# Patient Record
Sex: Female | Born: 1935 | Race: White | Hispanic: No | Smoking: Never smoker
Health system: Southern US, Community
[De-identification: ages and names within clinical notes are randomized; demographics above are authoritative.]

## PROBLEM LIST (undated history)

## (undated) DIAGNOSIS — R609 Edema, unspecified: Secondary | ICD-10-CM

## (undated) DIAGNOSIS — R17 Unspecified jaundice: Secondary | ICD-10-CM

## (undated) DIAGNOSIS — E538 Deficiency of other specified B group vitamins: Secondary | ICD-10-CM

## (undated) DIAGNOSIS — R5383 Other fatigue: Secondary | ICD-10-CM

## (undated) DIAGNOSIS — M1990 Unspecified osteoarthritis, unspecified site: Secondary | ICD-10-CM

## (undated) DIAGNOSIS — E7800 Pure hypercholesterolemia, unspecified: Secondary | ICD-10-CM

## (undated) DIAGNOSIS — E875 Hyperkalemia: Secondary | ICD-10-CM

## (undated) DIAGNOSIS — R948 Abnormal results of function studies of other organs and systems: Secondary | ICD-10-CM

## (undated) DIAGNOSIS — E559 Vitamin D deficiency, unspecified: Secondary | ICD-10-CM

## (undated) DIAGNOSIS — E039 Hypothyroidism, unspecified: Secondary | ICD-10-CM

## (undated) DIAGNOSIS — G894 Chronic pain syndrome: Secondary | ICD-10-CM

## (undated) DIAGNOSIS — M79609 Pain in unspecified limb: Secondary | ICD-10-CM

## (undated) DIAGNOSIS — G8929 Other chronic pain: Secondary | ICD-10-CM

## (undated) DIAGNOSIS — M549 Dorsalgia, unspecified: Secondary | ICD-10-CM

## (undated) DIAGNOSIS — K279 Peptic ulcer, site unspecified, unspecified as acute or chronic, without hemorrhage or perforation: Secondary | ICD-10-CM

## (undated) DIAGNOSIS — M949 Disorder of cartilage, unspecified: Secondary | ICD-10-CM

## (undated) DIAGNOSIS — I10 Essential (primary) hypertension: Secondary | ICD-10-CM

## (undated) DIAGNOSIS — M109 Gout, unspecified: Secondary | ICD-10-CM

## (undated) DIAGNOSIS — G5600 Carpal tunnel syndrome, unspecified upper limb: Secondary | ICD-10-CM

## (undated) DIAGNOSIS — R5381 Other malaise: Secondary | ICD-10-CM

## (undated) DIAGNOSIS — M899 Disorder of bone, unspecified: Secondary | ICD-10-CM

## (undated) DIAGNOSIS — E119 Type 2 diabetes mellitus without complications: Secondary | ICD-10-CM

## (undated) DIAGNOSIS — R413 Other amnesia: Secondary | ICD-10-CM

## (undated) DIAGNOSIS — N19 Unspecified kidney failure: Secondary | ICD-10-CM

## (undated) DIAGNOSIS — G589 Mononeuropathy, unspecified: Secondary | ICD-10-CM

## (undated) DIAGNOSIS — Z9289 Personal history of other medical treatment: Secondary | ICD-10-CM

## (undated) DIAGNOSIS — K219 Gastro-esophageal reflux disease without esophagitis: Secondary | ICD-10-CM

## (undated) DIAGNOSIS — T887XXA Unspecified adverse effect of drug or medicament, initial encounter: Secondary | ICD-10-CM

## (undated) HISTORY — DX: Other fatigue: R53.81

## (undated) HISTORY — DX: Personal history of other medical treatment: Z92.89

## (undated) HISTORY — DX: Essential (primary) hypertension: I10

## (undated) HISTORY — DX: Carpal tunnel syndrome, unspecified upper limb: G56.00

## (undated) HISTORY — DX: Other amnesia: R41.3

## (undated) HISTORY — PX: TOE AMPUTATION: SHX809

## (undated) HISTORY — DX: Dorsalgia, unspecified: M54.9

## (undated) HISTORY — DX: Pain in unspecified limb: M79.609

## (undated) HISTORY — DX: Gout, unspecified: M10.9

## (undated) HISTORY — DX: Edema, unspecified: R60.9

## (undated) HISTORY — PX: ESOPHAGOGASTRODUODENOSCOPY: SHX1529

## (undated) HISTORY — DX: Gastro-esophageal reflux disease without esophagitis: K21.9

## (undated) HISTORY — PX: OTHER SURGICAL HISTORY: SHX169

## (undated) HISTORY — DX: Deficiency of other specified B group vitamins: E53.8

## (undated) HISTORY — DX: Disorder of bone, unspecified: M89.9

## (undated) HISTORY — PX: TONSILLECTOMY: SUR1361

## (undated) HISTORY — PX: THYROIDECTOMY: SHX17

## (undated) HISTORY — DX: Chronic pain syndrome: G89.4

## (undated) HISTORY — DX: Unspecified adverse effect of drug or medicament, initial encounter: T88.7XXA

## (undated) HISTORY — DX: Peptic ulcer, site unspecified, unspecified as acute or chronic, without hemorrhage or perforation: K27.9

## (undated) HISTORY — DX: Unspecified osteoarthritis, unspecified site: M19.90

## (undated) HISTORY — DX: Unspecified jaundice: R17

## (undated) HISTORY — DX: Pure hypercholesterolemia, unspecified: E78.00

## (undated) HISTORY — DX: Hypothyroidism, unspecified: E03.9

## (undated) HISTORY — DX: Other malaise: R53.83

## (undated) HISTORY — DX: Unspecified kidney failure: N19

## (undated) HISTORY — PX: CATARACT EXTRACTION: SUR2

## (undated) HISTORY — DX: Vitamin D deficiency, unspecified: E55.9

## (undated) HISTORY — DX: Hyperkalemia: E87.5

## (undated) HISTORY — DX: Other chronic pain: G89.29

## (undated) HISTORY — DX: Mononeuropathy, unspecified: G58.9

## (undated) HISTORY — DX: Type 2 diabetes mellitus without complications: E11.9

## (undated) HISTORY — DX: Disorder of cartilage, unspecified: M94.9

## (undated) HISTORY — DX: Abnormal results of function studies of other organs and systems: R94.8

---
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
## 1997-07-22 ENCOUNTER — Encounter: Admission: RE | Admit: 1997-07-22 | Discharge: 1997-10-20 | Payer: Self-pay | Admitting: Internal Medicine

## 1997-07-22 ENCOUNTER — Encounter: Admission: RE | Admit: 1997-07-22 | Discharge: 1997-07-22 | Payer: Self-pay | Admitting: Internal Medicine

## 1997-08-14 ENCOUNTER — Ambulatory Visit (HOSPITAL_COMMUNITY): Admission: RE | Admit: 1997-08-14 | Discharge: 1997-08-14 | Payer: Self-pay | Admitting: Orthopedic Surgery

## 1997-11-25 ENCOUNTER — Encounter: Admission: RE | Admit: 1997-11-25 | Discharge: 1998-02-23 | Payer: Self-pay | Admitting: Internal Medicine

## 1998-04-22 LAB — FECAL OCCULT BLOOD, GUAIAC: Fecal Occult Blood: NEGATIVE

## 1998-05-12 ENCOUNTER — Encounter: Admission: RE | Admit: 1998-05-12 | Discharge: 1998-05-14 | Payer: Self-pay | Admitting: Internal Medicine

## 1998-08-18 ENCOUNTER — Encounter: Admission: RE | Admit: 1998-08-18 | Discharge: 1998-11-16 | Payer: Self-pay | Admitting: Internal Medicine

## 1998-12-14 ENCOUNTER — Encounter: Admission: RE | Admit: 1998-12-14 | Discharge: 1999-03-14 | Payer: Self-pay | Admitting: Orthopedic Surgery

## 1999-03-15 ENCOUNTER — Encounter: Admission: RE | Admit: 1999-03-15 | Discharge: 1999-06-13 | Payer: Self-pay | Admitting: Orthopedic Surgery

## 1999-09-06 ENCOUNTER — Encounter: Admission: RE | Admit: 1999-09-06 | Discharge: 1999-12-05 | Payer: Self-pay | Admitting: Orthopedic Surgery

## 1999-09-21 ENCOUNTER — Encounter: Payer: Self-pay | Admitting: Internal Medicine

## 1999-09-21 ENCOUNTER — Ambulatory Visit (HOSPITAL_COMMUNITY): Admission: RE | Admit: 1999-09-21 | Discharge: 1999-09-21 | Payer: Self-pay | Admitting: Internal Medicine

## 1999-11-01 ENCOUNTER — Encounter: Payer: Self-pay | Admitting: *Deleted

## 1999-11-01 ENCOUNTER — Ambulatory Visit (HOSPITAL_COMMUNITY): Admission: RE | Admit: 1999-11-01 | Discharge: 1999-11-01 | Payer: Self-pay | Admitting: *Deleted

## 1999-12-14 ENCOUNTER — Encounter: Admission: RE | Admit: 1999-12-14 | Discharge: 2000-03-13 | Payer: Self-pay | Admitting: Orthopedic Surgery

## 2000-02-22 ENCOUNTER — Encounter: Payer: Self-pay | Admitting: Family Medicine

## 2000-03-21 ENCOUNTER — Encounter: Admission: RE | Admit: 2000-03-21 | Discharge: 2000-06-19 | Payer: Self-pay | Admitting: Orthopedic Surgery

## 2000-04-03 ENCOUNTER — Encounter: Payer: Self-pay | Admitting: Nephrology

## 2000-04-03 ENCOUNTER — Encounter: Admission: RE | Admit: 2000-04-03 | Discharge: 2000-04-03 | Payer: Self-pay | Admitting: Nephrology

## 2000-06-20 ENCOUNTER — Encounter: Admission: RE | Admit: 2000-06-20 | Discharge: 2000-06-22 | Payer: Self-pay | Admitting: Orthopedic Surgery

## 2000-09-22 ENCOUNTER — Other Ambulatory Visit: Admission: RE | Admit: 2000-09-22 | Discharge: 2000-09-22 | Payer: Self-pay | Admitting: *Deleted

## 2000-10-03 ENCOUNTER — Encounter: Admission: RE | Admit: 2000-10-03 | Discharge: 2000-10-09 | Payer: Self-pay | Admitting: Orthopedic Surgery

## 2001-01-16 ENCOUNTER — Encounter: Admission: RE | Admit: 2001-01-16 | Discharge: 2001-01-23 | Payer: Self-pay | Admitting: Orthopedic Surgery

## 2001-04-24 ENCOUNTER — Encounter: Admission: RE | Admit: 2001-04-24 | Discharge: 2001-04-30 | Payer: Self-pay | Admitting: Orthopedic Surgery

## 2001-07-23 ENCOUNTER — Encounter (HOSPITAL_BASED_OUTPATIENT_CLINIC_OR_DEPARTMENT_OTHER): Admission: RE | Admit: 2001-07-23 | Discharge: 2001-10-21 | Payer: Self-pay | Admitting: Orthopedic Surgery

## 2001-10-29 ENCOUNTER — Encounter (HOSPITAL_BASED_OUTPATIENT_CLINIC_OR_DEPARTMENT_OTHER): Admission: RE | Admit: 2001-10-29 | Discharge: 2001-11-02 | Payer: Self-pay | Admitting: Internal Medicine

## 2002-02-11 ENCOUNTER — Encounter (HOSPITAL_BASED_OUTPATIENT_CLINIC_OR_DEPARTMENT_OTHER): Admission: RE | Admit: 2002-02-11 | Discharge: 2002-05-12 | Payer: Self-pay | Admitting: Internal Medicine

## 2002-05-17 ENCOUNTER — Encounter (HOSPITAL_BASED_OUTPATIENT_CLINIC_OR_DEPARTMENT_OTHER): Admission: RE | Admit: 2002-05-17 | Discharge: 2002-08-15 | Payer: Self-pay | Admitting: Internal Medicine

## 2002-08-22 ENCOUNTER — Encounter (HOSPITAL_BASED_OUTPATIENT_CLINIC_OR_DEPARTMENT_OTHER): Admission: RE | Admit: 2002-08-22 | Discharge: 2002-08-27 | Payer: Self-pay | Admitting: Internal Medicine

## 2002-11-25 ENCOUNTER — Encounter: Payer: Self-pay | Admitting: Internal Medicine

## 2002-11-25 ENCOUNTER — Inpatient Hospital Stay (HOSPITAL_COMMUNITY): Admission: EM | Admit: 2002-11-25 | Discharge: 2002-12-03 | Payer: Self-pay | Admitting: Emergency Medicine

## 2002-11-26 ENCOUNTER — Encounter: Payer: Self-pay | Admitting: Internal Medicine

## 2002-11-27 ENCOUNTER — Encounter (INDEPENDENT_AMBULATORY_CARE_PROVIDER_SITE_OTHER): Payer: Self-pay | Admitting: Specialist

## 2002-11-28 ENCOUNTER — Encounter (HOSPITAL_BASED_OUTPATIENT_CLINIC_OR_DEPARTMENT_OTHER): Admission: RE | Admit: 2002-11-28 | Discharge: 2002-12-24 | Payer: Self-pay | Admitting: Internal Medicine

## 2003-02-26 ENCOUNTER — Encounter: Admission: RE | Admit: 2003-02-26 | Discharge: 2003-02-26 | Payer: Self-pay | Admitting: Internal Medicine

## 2003-03-06 ENCOUNTER — Ambulatory Visit (HOSPITAL_COMMUNITY): Admission: RE | Admit: 2003-03-06 | Discharge: 2003-03-06 | Payer: Self-pay | Admitting: Gastroenterology

## 2003-03-20 ENCOUNTER — Encounter (HOSPITAL_BASED_OUTPATIENT_CLINIC_OR_DEPARTMENT_OTHER): Admission: RE | Admit: 2003-03-20 | Discharge: 2003-04-04 | Payer: Self-pay | Admitting: Internal Medicine

## 2003-06-20 ENCOUNTER — Encounter (HOSPITAL_BASED_OUTPATIENT_CLINIC_OR_DEPARTMENT_OTHER): Admission: RE | Admit: 2003-06-20 | Discharge: 2003-07-03 | Payer: Self-pay | Admitting: Internal Medicine

## 2003-11-13 ENCOUNTER — Encounter (HOSPITAL_BASED_OUTPATIENT_CLINIC_OR_DEPARTMENT_OTHER): Admission: RE | Admit: 2003-11-13 | Discharge: 2003-11-18 | Payer: Self-pay | Admitting: Internal Medicine

## 2004-02-17 ENCOUNTER — Encounter (HOSPITAL_BASED_OUTPATIENT_CLINIC_OR_DEPARTMENT_OTHER): Admission: RE | Admit: 2004-02-17 | Discharge: 2004-03-16 | Payer: Self-pay | Admitting: Internal Medicine

## 2004-06-07 ENCOUNTER — Encounter (HOSPITAL_BASED_OUTPATIENT_CLINIC_OR_DEPARTMENT_OTHER): Admission: RE | Admit: 2004-06-07 | Discharge: 2004-09-05 | Payer: Self-pay | Admitting: Surgery

## 2004-07-08 ENCOUNTER — Ambulatory Visit (HOSPITAL_COMMUNITY): Admission: RE | Admit: 2004-07-08 | Discharge: 2004-07-08 | Payer: Self-pay | Admitting: Orthopedic Surgery

## 2004-07-09 ENCOUNTER — Ambulatory Visit (HOSPITAL_COMMUNITY): Admission: RE | Admit: 2004-07-09 | Discharge: 2004-07-09 | Payer: Self-pay | Admitting: Orthopedic Surgery

## 2004-09-27 ENCOUNTER — Encounter (HOSPITAL_BASED_OUTPATIENT_CLINIC_OR_DEPARTMENT_OTHER): Admission: RE | Admit: 2004-09-27 | Discharge: 2004-10-05 | Payer: Self-pay | Admitting: Surgery

## 2004-10-15 ENCOUNTER — Inpatient Hospital Stay (HOSPITAL_COMMUNITY): Admission: RE | Admit: 2004-10-15 | Discharge: 2004-10-16 | Payer: Self-pay | Admitting: General Surgery

## 2004-10-15 ENCOUNTER — Encounter (INDEPENDENT_AMBULATORY_CARE_PROVIDER_SITE_OTHER): Payer: Self-pay | Admitting: Specialist

## 2004-12-09 ENCOUNTER — Encounter (HOSPITAL_BASED_OUTPATIENT_CLINIC_OR_DEPARTMENT_OTHER): Admission: RE | Admit: 2004-12-09 | Discharge: 2004-12-20 | Payer: Self-pay | Admitting: Surgery

## 2005-01-21 ENCOUNTER — Ambulatory Visit (HOSPITAL_COMMUNITY): Admission: RE | Admit: 2005-01-21 | Discharge: 2005-01-21 | Payer: Self-pay | Admitting: Internal Medicine

## 2005-02-01 ENCOUNTER — Ambulatory Visit (HOSPITAL_BASED_OUTPATIENT_CLINIC_OR_DEPARTMENT_OTHER): Admission: RE | Admit: 2005-02-01 | Discharge: 2005-02-01 | Payer: Self-pay | Admitting: Orthopedic Surgery

## 2005-02-01 ENCOUNTER — Ambulatory Visit (HOSPITAL_COMMUNITY): Admission: RE | Admit: 2005-02-01 | Discharge: 2005-02-01 | Payer: Self-pay | Admitting: Orthopedic Surgery

## 2005-04-12 ENCOUNTER — Encounter: Payer: Self-pay | Admitting: Family Medicine

## 2005-04-12 LAB — CONVERTED CEMR LAB: Hgb A1c MFr Bld: 3.3 %

## 2005-09-29 ENCOUNTER — Ambulatory Visit: Payer: Self-pay | Admitting: Family Medicine

## 2005-10-26 ENCOUNTER — Encounter: Admission: RE | Admit: 2005-10-26 | Discharge: 2005-10-26 | Payer: Self-pay | Admitting: Gastroenterology

## 2006-08-02 ENCOUNTER — Encounter: Payer: Self-pay | Admitting: Family Medicine

## 2006-08-02 DIAGNOSIS — M1990 Unspecified osteoarthritis, unspecified site: Secondary | ICD-10-CM

## 2006-08-02 DIAGNOSIS — R609 Edema, unspecified: Secondary | ICD-10-CM | POA: Insufficient documentation

## 2006-08-02 DIAGNOSIS — K219 Gastro-esophageal reflux disease without esophagitis: Secondary | ICD-10-CM

## 2006-08-02 DIAGNOSIS — N19 Unspecified kidney failure: Secondary | ICD-10-CM

## 2006-08-02 DIAGNOSIS — I10 Essential (primary) hypertension: Secondary | ICD-10-CM

## 2006-08-02 DIAGNOSIS — K279 Peptic ulcer, site unspecified, unspecified as acute or chronic, without hemorrhage or perforation: Secondary | ICD-10-CM | POA: Insufficient documentation

## 2006-08-02 DIAGNOSIS — G894 Chronic pain syndrome: Secondary | ICD-10-CM

## 2006-08-02 DIAGNOSIS — E119 Type 2 diabetes mellitus without complications: Secondary | ICD-10-CM | POA: Insufficient documentation

## 2006-08-02 DIAGNOSIS — E039 Hypothyroidism, unspecified: Secondary | ICD-10-CM

## 2006-08-02 DIAGNOSIS — E7800 Pure hypercholesterolemia, unspecified: Secondary | ICD-10-CM | POA: Insufficient documentation

## 2006-08-02 DIAGNOSIS — R17 Unspecified jaundice: Secondary | ICD-10-CM | POA: Insufficient documentation

## 2006-08-02 DIAGNOSIS — G589 Mononeuropathy, unspecified: Secondary | ICD-10-CM | POA: Insufficient documentation

## 2006-08-02 DIAGNOSIS — G5600 Carpal tunnel syndrome, unspecified upper limb: Secondary | ICD-10-CM

## 2006-08-04 ENCOUNTER — Ambulatory Visit: Payer: Self-pay | Admitting: Family Medicine

## 2006-08-04 DIAGNOSIS — M79609 Pain in unspecified limb: Secondary | ICD-10-CM

## 2006-08-04 DIAGNOSIS — M8580 Other specified disorders of bone density and structure, unspecified site: Secondary | ICD-10-CM

## 2006-08-04 DIAGNOSIS — M549 Dorsalgia, unspecified: Secondary | ICD-10-CM | POA: Insufficient documentation

## 2006-08-08 ENCOUNTER — Encounter: Payer: Self-pay | Admitting: Family Medicine

## 2006-08-14 ENCOUNTER — Encounter (INDEPENDENT_AMBULATORY_CARE_PROVIDER_SITE_OTHER): Payer: Self-pay | Admitting: *Deleted

## 2006-11-30 ENCOUNTER — Telehealth: Payer: Self-pay | Admitting: Family Medicine

## 2006-12-15 ENCOUNTER — Ambulatory Visit: Payer: Self-pay | Admitting: Family Medicine

## 2007-01-15 ENCOUNTER — Encounter: Payer: Self-pay | Admitting: Family Medicine

## 2007-02-01 ENCOUNTER — Encounter (INDEPENDENT_AMBULATORY_CARE_PROVIDER_SITE_OTHER): Payer: Self-pay | Admitting: *Deleted

## 2007-03-07 ENCOUNTER — Ambulatory Visit: Payer: Self-pay | Admitting: Internal Medicine

## 2007-03-08 LAB — CONVERTED CEMR LAB
Albumin: 3.9 g/dL (ref 3.5–5.2)
Basophils Absolute: 0 10*3/uL (ref 0.0–0.1)
Calcium: 9.2 mg/dL (ref 8.4–10.5)
Chloride: 103 meq/L (ref 96–112)
Eosinophils Absolute: 0 10*3/uL (ref 0.0–0.6)
GFR calc Af Amer: 27 mL/min
GFR calc non Af Amer: 23 mL/min
MCHC: 34.5 g/dL (ref 30.0–36.0)
MCV: 97.8 fL (ref 78.0–100.0)
Monocytes Relative: 6.5 % (ref 3.0–11.0)
Platelets: 225 10*3/uL (ref 150–400)
Potassium: 4.5 meq/L (ref 3.5–5.1)
RBC: 3.71 M/uL — ABNORMAL LOW (ref 3.87–5.11)
RDW: 12.8 % (ref 11.5–14.6)

## 2007-03-09 ENCOUNTER — Ambulatory Visit: Payer: Self-pay | Admitting: Family Medicine

## 2007-04-06 ENCOUNTER — Telehealth: Payer: Self-pay | Admitting: Family Medicine

## 2007-07-02 ENCOUNTER — Ambulatory Visit: Payer: Self-pay | Admitting: Family Medicine

## 2007-07-05 LAB — CONVERTED CEMR LAB
ALT: 11 units/L (ref 0–35)
BUN: 42 mg/dL — ABNORMAL HIGH (ref 6–23)
Bilirubin, Direct: 0.1 mg/dL (ref 0.0–0.3)
CO2: 27 meq/L (ref 19–32)
Chloride: 107 meq/L (ref 96–112)
Glucose, Bld: 134 mg/dL — ABNORMAL HIGH (ref 70–99)
Potassium: 4.6 meq/L (ref 3.5–5.1)
Pro B Natriuretic peptide (BNP): 100 pg/mL (ref 0.0–100.0)
Sed Rate: 23 mm/hr — ABNORMAL HIGH (ref 0–22)
Sodium: 139 meq/L (ref 135–145)
TSH: 0.36 microintl units/mL (ref 0.35–5.50)
Total Bilirubin: 1.1 mg/dL (ref 0.3–1.2)

## 2007-07-17 ENCOUNTER — Telehealth: Payer: Self-pay | Admitting: Family Medicine

## 2007-08-10 ENCOUNTER — Encounter: Payer: Self-pay | Admitting: Family Medicine

## 2007-08-16 ENCOUNTER — Encounter (INDEPENDENT_AMBULATORY_CARE_PROVIDER_SITE_OTHER): Payer: Self-pay | Admitting: *Deleted

## 2007-08-16 LAB — HM MAMMOGRAPHY: HM Mammogram: NORMAL

## 2007-08-29 ENCOUNTER — Telehealth: Payer: Self-pay | Admitting: Family Medicine

## 2007-09-04 ENCOUNTER — Encounter
Admission: RE | Admit: 2007-09-04 | Discharge: 2007-11-02 | Payer: Self-pay | Admitting: Physical Medicine and Rehabilitation

## 2007-09-05 ENCOUNTER — Ambulatory Visit: Payer: Self-pay | Admitting: Physical Medicine and Rehabilitation

## 2007-09-05 ENCOUNTER — Encounter: Payer: Self-pay | Admitting: Family Medicine

## 2007-09-05 ENCOUNTER — Encounter: Payer: Self-pay | Admitting: Physical Medicine and Rehabilitation

## 2007-09-05 ENCOUNTER — Ambulatory Visit: Payer: Self-pay | Admitting: Vascular Surgery

## 2007-09-05 ENCOUNTER — Ambulatory Visit (HOSPITAL_COMMUNITY)
Admission: RE | Admit: 2007-09-05 | Discharge: 2007-09-05 | Payer: Self-pay | Admitting: Physical Medicine and Rehabilitation

## 2007-09-17 ENCOUNTER — Ambulatory Visit: Payer: Self-pay | Admitting: Physical Medicine and Rehabilitation

## 2007-09-26 ENCOUNTER — Encounter: Payer: Self-pay | Admitting: Family Medicine

## 2007-09-26 ENCOUNTER — Encounter
Admission: RE | Admit: 2007-09-26 | Discharge: 2007-09-26 | Payer: Self-pay | Admitting: Physical Medicine and Rehabilitation

## 2007-10-01 ENCOUNTER — Encounter: Payer: Self-pay | Admitting: Family Medicine

## 2007-10-09 ENCOUNTER — Telehealth (INDEPENDENT_AMBULATORY_CARE_PROVIDER_SITE_OTHER): Payer: Self-pay | Admitting: *Deleted

## 2007-11-02 ENCOUNTER — Ambulatory Visit: Payer: Self-pay | Admitting: Physical Medicine and Rehabilitation

## 2007-11-12 ENCOUNTER — Telehealth (INDEPENDENT_AMBULATORY_CARE_PROVIDER_SITE_OTHER): Payer: Self-pay | Admitting: *Deleted

## 2007-12-11 ENCOUNTER — Telehealth: Payer: Self-pay | Admitting: Family Medicine

## 2008-01-09 ENCOUNTER — Telehealth: Payer: Self-pay | Admitting: Family Medicine

## 2008-01-24 ENCOUNTER — Encounter
Admission: RE | Admit: 2008-01-24 | Discharge: 2008-02-29 | Payer: Self-pay | Admitting: Physical Medicine and Rehabilitation

## 2008-01-25 ENCOUNTER — Ambulatory Visit: Payer: Self-pay | Admitting: Physical Medicine and Rehabilitation

## 2008-02-04 ENCOUNTER — Telehealth: Payer: Self-pay | Admitting: Family Medicine

## 2008-02-29 ENCOUNTER — Ambulatory Visit: Payer: Self-pay | Admitting: Physical Medicine and Rehabilitation

## 2008-03-04 ENCOUNTER — Encounter: Payer: Self-pay | Admitting: Family Medicine

## 2008-03-10 ENCOUNTER — Telehealth: Payer: Self-pay | Admitting: Family Medicine

## 2008-03-21 ENCOUNTER — Encounter: Payer: Self-pay | Admitting: Family Medicine

## 2008-03-31 ENCOUNTER — Encounter: Payer: Self-pay | Admitting: Family Medicine

## 2008-04-09 ENCOUNTER — Telehealth: Payer: Self-pay | Admitting: Family Medicine

## 2008-04-14 ENCOUNTER — Telehealth: Payer: Self-pay | Admitting: Internal Medicine

## 2008-04-24 ENCOUNTER — Encounter
Admission: RE | Admit: 2008-04-24 | Discharge: 2008-07-23 | Payer: Self-pay | Admitting: Physical Medicine and Rehabilitation

## 2008-04-25 ENCOUNTER — Ambulatory Visit: Payer: Self-pay | Admitting: Physical Medicine and Rehabilitation

## 2008-05-13 ENCOUNTER — Telehealth: Payer: Self-pay | Admitting: Family Medicine

## 2008-06-09 ENCOUNTER — Ambulatory Visit: Payer: Self-pay | Admitting: Physical Medicine and Rehabilitation

## 2008-06-24 ENCOUNTER — Telehealth: Payer: Self-pay | Admitting: Family Medicine

## 2008-06-25 ENCOUNTER — Ambulatory Visit: Payer: Self-pay | Admitting: Family Medicine

## 2008-06-25 DIAGNOSIS — R5381 Other malaise: Secondary | ICD-10-CM

## 2008-06-25 DIAGNOSIS — R5383 Other fatigue: Secondary | ICD-10-CM

## 2008-06-25 LAB — CONVERTED CEMR LAB
Bilirubin Urine: NEGATIVE
Epithelial cells, urine: 2 /lpf
Glucose, Urine, Semiquant: NEGATIVE
Ketones, urine, test strip: NEGATIVE
Specific Gravity, Urine: 1.01
Urobilinogen, UA: 0.2
Yeast, UA: 0
pH: 6

## 2008-06-26 LAB — CONVERTED CEMR LAB
Albumin: 4.3 g/dL (ref 3.5–5.2)
BUN: 35 mg/dL — ABNORMAL HIGH (ref 6–23)
Basophils Absolute: 0.1 10*3/uL (ref 0.0–0.1)
Calcium: 9.2 mg/dL (ref 8.4–10.5)
Cholesterol: 179 mg/dL (ref 0–200)
Direct LDL: 88.8 mg/dL
Eosinophils Absolute: 0.1 10*3/uL (ref 0.0–0.7)
Folate: 17.5 ng/mL
Glucose, Bld: 155 mg/dL — ABNORMAL HIGH (ref 70–99)
HCT: 37.1 % (ref 36.0–46.0)
HDL: 29.4 mg/dL — ABNORMAL LOW (ref >39.00)
Hemoglobin: 13 g/dL (ref 12.0–15.0)
Hgb A1c MFr Bld: 6.7 % — ABNORMAL HIGH (ref 4.6–6.5)
Lymphs Abs: 4.2 10*3/uL — ABNORMAL HIGH (ref 0.7–4.0)
MCHC: 35 g/dL (ref 30.0–36.0)
Neutro Abs: 1.7 10*3/uL (ref 1.4–7.7)
Platelets: 217 10*3/uL (ref 150.0–400.0)
Potassium: 4.3 meq/L (ref 3.5–5.1)
RDW: 14.8 % — ABNORMAL HIGH (ref 11.5–14.6)
TSH: 0.2 microintl units/mL — ABNORMAL LOW (ref 0.35–5.50)
Total Bilirubin: 0.8 mg/dL (ref 0.3–1.2)
VLDL: 97 mg/dL — ABNORMAL HIGH (ref 0.0–40.0)
Vit D, 25-Hydroxy: 25 ng/mL — ABNORMAL LOW (ref 30–89)
Vitamin B-12: 230 pg/mL (ref 211–911)

## 2008-06-27 ENCOUNTER — Encounter: Payer: Self-pay | Admitting: Family Medicine

## 2008-07-02 ENCOUNTER — Ambulatory Visit: Payer: Self-pay | Admitting: Family Medicine

## 2008-07-02 DIAGNOSIS — E538 Deficiency of other specified B group vitamins: Secondary | ICD-10-CM | POA: Insufficient documentation

## 2008-07-02 DIAGNOSIS — E559 Vitamin D deficiency, unspecified: Secondary | ICD-10-CM | POA: Insufficient documentation

## 2008-07-02 DIAGNOSIS — F0390 Unspecified dementia without behavioral disturbance: Secondary | ICD-10-CM | POA: Insufficient documentation

## 2008-07-07 ENCOUNTER — Ambulatory Visit: Payer: Self-pay | Admitting: Physical Medicine and Rehabilitation

## 2008-07-09 ENCOUNTER — Ambulatory Visit: Payer: Self-pay | Admitting: Family Medicine

## 2008-07-16 ENCOUNTER — Ambulatory Visit: Payer: Self-pay | Admitting: Family Medicine

## 2008-07-25 ENCOUNTER — Telehealth: Payer: Self-pay | Admitting: Family Medicine

## 2008-07-25 ENCOUNTER — Ambulatory Visit: Payer: Self-pay | Admitting: Family Medicine

## 2008-08-01 ENCOUNTER — Ambulatory Visit: Payer: Self-pay | Admitting: Family Medicine

## 2008-08-07 ENCOUNTER — Ambulatory Visit: Payer: Self-pay | Admitting: Family Medicine

## 2008-08-08 ENCOUNTER — Encounter (INDEPENDENT_AMBULATORY_CARE_PROVIDER_SITE_OTHER): Payer: Self-pay | Admitting: *Deleted

## 2008-08-08 ENCOUNTER — Encounter: Admission: RE | Admit: 2008-08-08 | Discharge: 2008-08-08 | Payer: Self-pay | Admitting: Family Medicine

## 2008-08-08 ENCOUNTER — Encounter: Payer: Self-pay | Admitting: Family Medicine

## 2008-08-11 ENCOUNTER — Encounter
Admission: RE | Admit: 2008-08-11 | Discharge: 2008-08-11 | Payer: Self-pay | Admitting: Physical Medicine & Rehabilitation

## 2008-08-14 ENCOUNTER — Ambulatory Visit: Payer: Self-pay | Admitting: Family Medicine

## 2008-08-14 DIAGNOSIS — G8929 Other chronic pain: Secondary | ICD-10-CM

## 2008-08-27 ENCOUNTER — Telehealth: Payer: Self-pay | Admitting: Family Medicine

## 2008-08-28 ENCOUNTER — Telehealth: Payer: Self-pay | Admitting: Family Medicine

## 2008-09-17 ENCOUNTER — Telehealth: Payer: Self-pay | Admitting: Family Medicine

## 2008-09-25 ENCOUNTER — Ambulatory Visit: Payer: Self-pay | Admitting: Family Medicine

## 2008-09-25 DIAGNOSIS — M109 Gout, unspecified: Secondary | ICD-10-CM

## 2008-10-09 ENCOUNTER — Telehealth: Payer: Self-pay | Admitting: Family Medicine

## 2008-11-06 ENCOUNTER — Ambulatory Visit: Payer: Self-pay | Admitting: Family Medicine

## 2008-11-10 ENCOUNTER — Encounter: Payer: Self-pay | Admitting: Family Medicine

## 2008-11-11 ENCOUNTER — Ambulatory Visit: Payer: Self-pay | Admitting: Family Medicine

## 2009-01-09 ENCOUNTER — Ambulatory Visit: Payer: Self-pay | Admitting: Family Medicine

## 2009-01-13 LAB — CONVERTED CEMR LAB
Albumin: 3.8 g/dL (ref 3.5–5.2)
CO2: 28 meq/L (ref 19–32)
Calcium: 9.1 mg/dL (ref 8.4–10.5)
Chloride: 106 meq/L (ref 96–112)
Phosphorus: 3.9 mg/dL (ref 2.3–4.6)
Potassium: 4.2 meq/L (ref 3.5–5.1)

## 2009-02-09 ENCOUNTER — Ambulatory Visit: Payer: Self-pay | Admitting: Family Medicine

## 2009-02-17 ENCOUNTER — Encounter: Payer: Self-pay | Admitting: Family Medicine

## 2009-02-17 ENCOUNTER — Telehealth: Payer: Self-pay | Admitting: Family Medicine

## 2009-02-27 ENCOUNTER — Telehealth: Payer: Self-pay | Admitting: Family Medicine

## 2009-03-06 ENCOUNTER — Telehealth: Payer: Self-pay | Admitting: Family Medicine

## 2009-03-22 ENCOUNTER — Emergency Department: Payer: Self-pay | Admitting: Emergency Medicine

## 2009-03-23 ENCOUNTER — Telehealth: Payer: Self-pay | Admitting: Family Medicine

## 2009-03-27 ENCOUNTER — Ambulatory Visit: Payer: Self-pay | Admitting: Family Medicine

## 2009-03-27 ENCOUNTER — Telehealth: Payer: Self-pay | Admitting: Family Medicine

## 2009-03-27 DIAGNOSIS — T887XXA Unspecified adverse effect of drug or medicament, initial encounter: Secondary | ICD-10-CM

## 2009-03-30 ENCOUNTER — Telehealth: Payer: Self-pay | Admitting: Family Medicine

## 2009-04-23 ENCOUNTER — Telehealth: Payer: Self-pay | Admitting: Family Medicine

## 2009-05-22 ENCOUNTER — Telehealth: Payer: Self-pay | Admitting: Family Medicine

## 2009-06-23 ENCOUNTER — Telehealth: Payer: Self-pay | Admitting: Family Medicine

## 2009-09-07 ENCOUNTER — Telehealth: Payer: Self-pay | Admitting: Family Medicine

## 2009-10-23 ENCOUNTER — Telehealth: Payer: Self-pay | Admitting: Family Medicine

## 2009-11-19 ENCOUNTER — Telehealth: Payer: Self-pay | Admitting: Family Medicine

## 2009-12-24 ENCOUNTER — Telehealth: Payer: Self-pay | Admitting: Family Medicine

## 2010-01-21 ENCOUNTER — Telehealth: Payer: Self-pay | Admitting: Family Medicine

## 2010-02-23 ENCOUNTER — Telehealth: Payer: Self-pay | Admitting: Family Medicine

## 2010-03-03 ENCOUNTER — Telehealth: Payer: Self-pay | Admitting: Family Medicine

## 2010-03-13 ENCOUNTER — Encounter: Payer: Self-pay | Admitting: Gastroenterology

## 2010-03-13 ENCOUNTER — Encounter: Payer: Self-pay | Admitting: Internal Medicine

## 2010-03-21 LAB — CONVERTED CEMR LAB
Basophils Relative: 0 % (ref 0–1)
Eosinophils Absolute: 0.1 10*3/uL (ref 0.0–0.7)
MCHC: 32.9 g/dL (ref 30.0–36.0)
MCV: 97.6 fL (ref 78.0–100.0)
Monocytes Relative: 10 % (ref 3–12)
Neutro Abs: 7 10*3/uL (ref 1.7–7.7)
Neutrophils Relative %: 70 % (ref 43–77)
Platelets: 241 10*3/uL (ref 150–400)
RBC: 3.8 M/uL — ABNORMAL LOW (ref 3.87–5.11)
Uric Acid, Serum: 9.6 mg/dL — ABNORMAL HIGH (ref 2.4–7.0)
WBC: 10 10*3/uL (ref 4.0–10.5)

## 2010-03-23 ENCOUNTER — Telehealth: Payer: Self-pay | Admitting: Family Medicine

## 2010-03-23 NOTE — Progress Notes (Signed)
Summary: prior auth for nexium denied  Phone Note From Pharmacy   Caller: [facility] Summary of Call: Prior auth denied for nexium.  Letter is on  your shelf. Initial call taken by: Lowella Petties CMA,  March 06, 2009 3:43 PM  Follow-up for Phone Call        please let pt's son know that ins will not pay for three times a day dosing - since this is not a clinically recommended dose (in fact - I have never had pt on three times a day ...) will need to go down to two times a day for a while and update me if symptoms worsen (if they do will need GI consult )  let me know if this is ok -- and will re send px for two times a day instructions  Follow-up by: Judith Part MD,  March 06, 2009 4:47 PM  Additional Follow-up for Phone Call Additional follow up Details #1::        Advised pt's son, he agrees to twice a day dosing.  Please send to [facility].  px written on EMR for call in - thanks -- MT Additional Follow-up by: Lowella Petties CMA,  March 06, 2009 4:56 PM    Additional Follow-up for Phone Call Additional follow up Details #2::    Called to [facility]. Follow-up by: Lowella Petties CMA,  March 06, 2009 5:33 PM  New/Updated Medications: NEXIUM 40 MG CPDR (ESOMEPRAZOLE MAGNESIUM) Take 1 tablet by mouth two times a day Prescriptions: NEXIUM 40 MG CPDR (ESOMEPRAZOLE MAGNESIUM) Take 1 tablet by mouth two times a day  #60 x 11   Entered and Authorized by:   Judith Part MD   Signed by:   Judith Part MD on 03/06/2009   Method used:   Telephoned to ...       [facility]  [address] (retail)       [address]       [place], [place]  [address]       Ph: [phone] or [phone]       Fax: [phone]   RxID:   [id number]

## 2010-03-23 NOTE — Progress Notes (Signed)
Summary: refill request for vicodin  Phone Note Refill Request Message from:  Fax from Pharmacy  Refills Requested: Medication #1:  HYDROCODONE-ACETAMINOPHEN 5-325 MG  TABS 1-2 by mouth q 4-6 hours as needed pain   Last Refilled: 02/09/2009 Faxed request from [place].  Initial call taken by: Lowella Petties CMA,  March 27, 2009 11:49 AM  Follow-up for Phone Call        px written on EMR for call in  Follow-up by: Judith Part MD,  March 27, 2009 1:43 PM  Additional Follow-up for Phone Call Additional follow up Details #1::        Called to [facility]. Additional Follow-up by: Lowella Petties CMA,  March 27, 2009 2:53 PM    New/Updated Medications: HYDROCODONE-ACETAMINOPHEN 5-325 MG  TABS (HYDROCODONE-ACETAMINOPHEN) 1-2 by mouth q 4-6 hours as needed pain Prescriptions: HYDROCODONE-ACETAMINOPHEN 5-325 MG  TABS (HYDROCODONE-ACETAMINOPHEN) 1-2 by mouth q 4-6 hours as needed pain  #90 x 0   Entered and Authorized by:   Judith Part MD   Signed by:   Judith Part MD on 03/27/2009   Method used:   Telephoned to ...       [facility]  [address] (retail)       [address]       [place], [place]  [address]       Ph: [phone] or [phone]       Fax: [phone]   RxID:   [id number]

## 2010-03-23 NOTE — Progress Notes (Signed)
Summary: Hydrocodone-APAP  Phone Note Refill Request Message from:  Fax from Pharmacy on December 24, 2009 2:20 PM  Refills Requested: Medication #1:  HYDROCODONE-ACETAMINOPHEN 5-325 MG  TABS 1-2 by mouth q 4-6 hours as needed pain [facility]  Phone:   [phone]   Method Requested: Telephone to Pharmacy Initial call taken by: Delilah Shan CMA Duncan Dull),  December 24, 2009 2:22 PM  Follow-up for Phone Call        px written on EMR for call in  Follow-up by: Judith Part MD,  December 24, 2009 5:05 PM  Additional Follow-up for Phone Call Additional follow up Details #1::        Medication phoned to [facility] pharmacy as instructed. Lewanda Rife LPN  December 25, 2009 9:47 AM     Prescriptions: HYDROCODONE-ACETAMINOPHEN 5-325 MG  TABS (HYDROCODONE-ACETAMINOPHEN) 1-2 by mouth q 4-6 hours as needed pain  #90 x 0   Entered and Authorized by:   Judith Part MD   Signed by:   Lewanda Rife LPN on 09/81/1914   Method used:   Telephoned to ...       [facility]  [address] (retail)       [address]       [place], [place]  [address]       Ph: [phone] or [phone]       Fax: [phone]   RxID:   [id number]

## 2010-03-23 NOTE — Progress Notes (Signed)
Summary: refill request for gabapentin  Phone Note Refill Request Message from:  Fax from Pharmacy  Refills Requested: Medication #1:  GABAPENTIN 100 MG CAPS one cap by mouth qpm   Last Refilled: 01/23/2009 [facility]  Initial call taken by: Lowella Petties CMA,  February 27, 2009 12:22 PM  Follow-up for Phone Call        px written on EMR for call in  Follow-up by: Judith Part MD,  February 27, 2009 1:27 PM  Additional Follow-up for Phone Call Additional follow up Details #1::        Called to [facility]. Additional Follow-up by: Lowella Petties CMA,  February 27, 2009 2:14 PM    New/Updated Medications: GABAPENTIN 100 MG CAPS (GABAPENTIN) one cap by mouth each am , and 1 by mouth at bedtime Prescriptions: GABAPENTIN 100 MG CAPS (GABAPENTIN) one cap by mouth each am , and 1 by mouth at bedtime  #60 x 5   Entered and Authorized by:   Judith Part MD   Signed by:   Lowella Petties CMA on 02/27/2009   Method used:   Telephoned to ...       [facility]  [address] (retail)       [address]       [place], [place]  [address]       Ph: [phone] or [phone]       Fax: [phone]   RxID:   [id number]

## 2010-03-23 NOTE — Progress Notes (Signed)
Summary: refill request for vicodin  Phone Note Refill Request Message from:  Fax from Pharmacy  Refills Requested: Medication #1:  HYDROCODONE-ACETAMINOPHEN 5-325 MG  TABS 1-2 by mouth q 4-6 hours as needed pain   Last Refilled: 04/23/2009 Faxed request from [place], [phone].  Initial call taken by: Lowella Petties CMA,  May 22, 2009 3:43 PM  Follow-up for Phone Call        px written on EMR for call in  Follow-up by: Judith Part MD,  May 22, 2009 4:45 PM  Additional Follow-up for Phone Call Additional follow up Details #1::        Medication phoned to [facility] pharmacy as instructed. Lewanda Rife LPN  May 23, 4538 4:50 PM     New/Updated Medications: HYDROCODONE-ACETAMINOPHEN 5-325 MG  TABS (HYDROCODONE-ACETAMINOPHEN) 1-2 by mouth q 4-6 hours as needed pain Prescriptions: HYDROCODONE-ACETAMINOPHEN 5-325 MG  TABS (HYDROCODONE-ACETAMINOPHEN) 1-2 by mouth q 4-6 hours as needed pain  #90 x 0   Entered and Authorized by:   Judith Part MD   Signed by:   Lewanda Rife LPN on 98/12/9145   Method used:   Telephoned to ...       [facility]  [address] (retail)       [address]       [place], [place]  [address]       Ph: [phone] or [phone]       Fax: [phone]   RxID:   [id number]

## 2010-03-23 NOTE — Assessment & Plan Note (Signed)
Summary: CONSULTATION W/ PT'S SON,DON/CLE   History of Present Illness: here for consultation about mother today (son is here)  last weekend - had mental status changes  went to [facility]  wants to rev meds    calcium and vit D - not GI tolerated - needs to stop that   lidoderm patch really helps her knees duragesic patch -- is really affecting her MS -- but pain is so severe - " is making her crazy"  hydrocodone is helping -- and that does not seem to affect her   does not think gout is a problem   namenda -- did fine with the titration pack -- until the 3 rd week  5 mg is still trouble some    Allergies: 1)  ! Vioxx 2)  ! * Namenda 10 Mg 3)  ! Aricept 4)  ! Aricept 5)  ! * Namenda 6)  ! * Calcium 7)  Penicillin 8)  Sulfa 9)  Lasix 10)  Percodan 11)  * Lyrica   Impression & Recommendations:  Problem # 1:  ADVERSE DRUG REACTION (ICD-995.20) Assessment New having more mental status changes- rev meds with choice to stop some that are not helping stopping ca, colchicine , neurontin, aricept, namenda  Problem # 2:  OTHER CHRONIC PAIN (ICD-338.29) if no imp in MS- will need to re eval pain control -- rec f/u pain clinic  will disc further at march f/u  Problem # 3:  MEMORY LOSS (ICD-780.93) Assessment: Deteriorated non tol to aricept and namenda-- son thinks this is causing more confusion  will d/c both disc poss of worsening dementia will re eval at march f/u  Problem # 4:  GOUT (ICD-274.9) has been well controlled with allopurinol trial off colchicine and re eval  The following medications were removed from the medication list:    Colchicine 0.6 Mg Tabs (Colchicine) .Marland Kitchen... Take 1 tablet by mouth once a day Her updated medication list for this problem includes:    Allopurinol 100 Mg Tabs (Allopurinol) .Marland Kitchen... 2 tabs by mouth daily  Complete Medication List: 1)  Nexium 40 Mg Cpdr (Esomeprazole magnesium) .... Take 1 tablet by mouth two times a day 2)  Lisinopril  10 Mg Tabs (Lisinopril) .... Take one by mouth daily 3)  Triamterene-hctz 75-50 Mg Tabs (Triamterene-hctz) .... Take 1/2  by mouth daily 4)  Levothroid 100 Mcg Tabs (Levothyroxine sodium) .Marland Kitchen.. 1 by mouth once daily 5)  Humulin 70/30 Susp (Insulin isophane & reg (human) susp) .... Use as directed 6)  Hydrocodone-acetaminophen 5-325 Mg Tabs (Hydrocodone-acetaminophen) .Marland Kitchen.. 1-2 by mouth q 4-6 hours as needed pain 7)  Bd Insulin Syringe Ultrafine 30g X 1/2" 0.5 Ml Misc (Insulin syringe-needle u-100) .... Use as directed 8)  Amlodipine Besylate 10 Mg Tabs (Amlodipine besylate) .... One tab by mouth once daily 9)  Allopurinol 100 Mg Tabs (Allopurinol) .... 2 tabs by mouth daily 10)  Lidoderm 5 % Ptch (Lidocaine) .... Apply up to 3 patches for 12 hours. 11)  Vitamin B12 1000 Mcg  .... One shot weekly for 4 weeks 12)  Support Stockings To The Knee 15 Mm Hg  .... To wear during the day for pedal edema 782.3 with venous insufficency 13)  Duragesic-12 12 Mcg/hr Pt72 (Fentanyl) .Marland Kitchen.. 1 patch, apply to skin, replace every 72 hours. disp 1 month supply 14)  Compression Stockings  .Marland KitchenMarland Kitchen. 15-20 mm hg 15)  Vitamin D 2000 Unit Tabs (Cholecalciferol) .... Take 1 tablet by mouth once a day  Patient  Instructions: 1)  discontinue the following medicines- calcium, colchicine, neurontin, namenda and aricept  2)  have her f/u with me on 3/24 as planned (please verify that appt)  3)  let me know if you think we need a pain clinic visit - if pain is still not well controlled   Prior Medications (reviewed today): NEXIUM 40 MG CPDR (ESOMEPRAZOLE MAGNESIUM) Take 1 tablet by mouth two times a day LISINOPRIL 10 MG TABS (LISINOPRIL) take one by mouth daily TRIAMTERENE-HCTZ 75-50 MG TABS (TRIAMTERENE-HCTZ) take 1/2  by mouth daily LEVOTHROID 100 MCG TABS (LEVOTHYROXINE SODIUM) 1 by mouth once daily HUMULIN 70/30  SUSP (INSULIN ISOPHANE & REG (HUMAN) SUSP) use as directed HYDROCODONE-ACETAMINOPHEN 5-325 MG  TABS  (HYDROCODONE-ACETAMINOPHEN) 1-2 by mouth q 4-6 hours as needed pain BD INSULIN SYRINGE ULTRAFINE 30G X 1/2" 0.5 ML  MISC (INSULIN SYRINGE-NEEDLE U-100) use as directed AMLODIPINE BESYLATE 10 MG TABS (AMLODIPINE BESYLATE) one tab by mouth once daily ALLOPURINOL 100 MG TABS (ALLOPURINOL) 2 tabs by mouth daily LIDODERM 5 % PTCH (LIDOCAINE) apply up to 3 patches for 12 hours. VITAMIN B12 1000 MCG () one shot weekly for 4 weeks SUPPORT STOCKINGS TO THE KNEE 15 MM HG () to wear during the day for pedal edema 782.3 with venous insufficency DURAGESIC-12 12 MCG/HR PT72 (FENTANYL) 1 patch, apply to skin, replace every 72 hours. disp 1 month supply COMPRESSION STOCKINGS () 15-20 mm Hg VITAMIN D 2000 UNIT TABS (CHOLECALCIFEROL) Take 1 tablet by mouth once a day Current Allergies (reviewed today): ! VIOXX ! * NAMENDA 10 MG ! ARICEPT ! ARICEPT ! * NAMENDA ! * CALCIUM PENICILLIN SULFA LASIX PERCODAN * LYRICA

## 2010-03-23 NOTE — Progress Notes (Signed)
Summary: Pt's son wants call back from Dr. Milinda Antis   Phone Note Call from Patient   Caller: Patient's son Sara Lang came by office Call For: Sara Part MD Summary of Call: Sara Lang came by office this AM concerned that his brother Sara Lang had called our office on Fri 01/28/11about a concern re: Namenda. No phone note was noted in system and Sara Lang or Sara Lang did not remember a triage call on Friday for pt. Sara Lang spoke with Sara Lang and he said he spoke with someone at front desk and then contacted [facility]. We received the fax from [facility] on 03/20/09 at 6:18pm. Notation on fax from [place] that pt's son states that she has become very "angry" and is talking to herself, etc. Since increasing to the 10mg  dose of Namenda. How do you feel about having her take only 5mg  twice daily. Let us or the patient know. According to Sara pt was seen in ER last night and discharged today at 5am.  Sara Lang spoke with pt and she gave permission to speak with Sara Lang. Pt's son Sara Lang would like to speak with Dr. Milinda Antis directly about reviewing pt's meds ASAP. Please call Sara Lang at [phone]. Sara Lang called Sara Lang and left v/m with explanation of what occurred Fri with fax coming late from [place] and was in system now.) Please advise. copy on fax is in your box. Initial call taken by: Sara Lang,  March 23, 2009 11:04 AM  Follow-up for Phone Call        we can definately go down on dose of namenda to 5 (if she is not tolerating the 10) please call for her ER records  will change px in EMR to send to [facility]  I also talked to [facility] and he thinks the aricept may also be adding to come confusion- Lang I adv him to hold that and schedule f/u when able - we will further review her meds then Follow-up by: Sara Part MD,  March 23, 2009 11:11 AM  Additional Follow-up for Phone Call Additional follow up Details #1::        Faxed request for records to [facility].  Script for [employer] sent to [place]. Additional Follow-up by:  Sara Lang CMA,  March 23, 2009 11:31 AM   New Allergies: ! * NAMENDA 10 MG ! ARICEPT New/Updated Medications: NAMENDA 5 MG TABS (MEMANTINE HCL) 1 by mouth two times a day New Allergies: ! * NAMENDA 10 MG ! ARICEPTPrescriptions: NAMENDA 5 MG TABS (MEMANTINE HCL) 1 by mouth two times a day  #60 x 5   Entered and Authorized by:   Sara Part MD   Signed by:   Sara Part MD on 03/23/2009   Method used:   Telephoned to ...       [facility]  [address] (retail)       [address]       [place], [place]  [address]       Ph: [phone] or [phone]       Fax: [phone]   RxID:   [id number]

## 2010-03-23 NOTE — Progress Notes (Signed)
Summary: refill request for vicodin  Phone Note Refill Request Message from:  Fax from Pharmacy  Refills Requested: Medication #1:  HYDROCODONE-ACETAMINOPHEN 5-325 MG  TABS 1-2 by mouth q 4-6 hours as needed pain   Last Refilled: 03/27/2009 Faxed request from [place]. [phone]  Initial call taken by: Lowella Petties CMA,  April 23, 2009 11:57 AM  Follow-up for Phone Call        px written on EMR for call in  Follow-up by: Judith Part MD,  April 23, 2009 1:12 PM  Additional Follow-up for Phone Call Additional follow up Details #1::        Medication phoned to pharmacy.  Additional Follow-up by: Delilah Shan CMA Duncan Dull),  April 23, 2009 3:53 PM    Prescriptions: HYDROCODONE-ACETAMINOPHEN 5-325 MG  TABS (HYDROCODONE-ACETAMINOPHEN) 1-2 by mouth q 4-6 hours as needed pain  #90 x 0   Entered and Authorized by:   Judith Part MD   Signed by:   Delilah Shan CMA (AAMA) on 04/23/2009   Method used:   Telephoned to ...       [facility]  [address] (retail)       [address]       [place], [place]  [address]       Ph: [phone] or [phone]       Fax: [phone]   RxID:   [id number]

## 2010-03-23 NOTE — Letter (Signed)
Summary: CMN for Diabetes Supplies/[facility]  CMN for Diabetes Supplies/[facility]   Imported By: Lanelle Bal 03/19/2009 10:50:32  _____________________________________________________________________  External Attachment:    Type:   Image     Comment:   External Document

## 2010-03-23 NOTE — Progress Notes (Signed)
Summary: refill request for vicodin  Phone Note Refill Request Message from:  Fax from Pharmacy  Refills Requested: Medication #1:  HYDROCODONE-ACETAMINOPHEN 5-325 MG  TABS 1-2 by mouth q 4-6 hours as needed pain   Last Refilled: 12/25/2009 Faxed request from [place], [phone].  Initial call taken by: Lowella Petties CMA, AAMA,  January 21, 2010 4:15 PM  Follow-up for Phone Call        px written on EMR for call in  Follow-up by: Judith Part MD,  January 21, 2010 10:07 PM  Additional Follow-up for Phone Call Additional follow up Details #1::        Medication phoned to  [facility] pharmacy as instructed. Lewanda Rife LPN  January 22, 2010 9:09 AM     New/Updated Medications: HYDROCODONE-ACETAMINOPHEN 5-325 MG  TABS (HYDROCODONE-ACETAMINOPHEN) 1-2 by mouth q 4-6 hours as needed pain Prescriptions: HYDROCODONE-ACETAMINOPHEN 5-325 MG  TABS (HYDROCODONE-ACETAMINOPHEN) 1-2 by mouth q 4-6 hours as needed pain  #90 x 0   Entered and Authorized by:   Judith Part MD   Signed by:   Lewanda Rife LPN on 11/91/4782   Method used:   Telephoned to ...       [facility]  [address] (retail)       [address]       [place], [place]  [address]       Ph: [phone] or [phone]       Fax: [phone]   RxID:   [id number]

## 2010-03-23 NOTE — Progress Notes (Signed)
Summary: refill request for vicodin  Phone Note Refill Request Message from:  Fax from Pharmacy  Refills Requested: Medication #1:  HYDROCODONE-ACETAMINOPHEN 5-325 MG  TABS 1-2 by mouth q 4-6 hours as needed pain   Last Refilled: 05/22/2009 Faxed request from [place], [phone].  Initial call taken by: Lowella Petties CMA,  Jun 23, 2009 9:10 AM  Follow-up for Phone Call        px written on EMR for call in  Follow-up by: Judith Part MD,  Jun 23, 2009 11:25 AM  Additional Follow-up for Phone Call Additional follow up Details #1::        Medication phoned to [facility] pharmacy as instructed. Lewanda Rife LPN  Jun 24, 7827 12:39 PM     Prescriptions: HYDROCODONE-ACETAMINOPHEN 5-325 MG  TABS (HYDROCODONE-ACETAMINOPHEN) 1-2 by mouth q 4-6 hours as needed pain  #90 x 0   Entered and Authorized by:   Judith Part MD   Signed by:   Lewanda Rife LPN on 56/21/3086   Method used:   Telephoned to ...       [facility]  [address] (retail)       [address]       [place], [place]  [address]       Ph: [phone] or [phone]       Fax: [phone]   RxID:   [id number]

## 2010-03-23 NOTE — Progress Notes (Signed)
Summary: vicodin   Phone Note Refill Request Message from:  Fax from Pharmacy on November 19, 2009 1:16 PM  Refills Requested: Medication #1:  HYDROCODONE-ACETAMINOPHEN 5-325 MG  TABS 1-2 by mouth q 4-6 hours as needed pain   Last Refilled: March 14, 1922 Refill request from [place]. [phone]  Initial call taken by: Melody Comas,  November 19, 2009 1:16 PM  Follow-up for Phone Call        Rx called to [facility] as instructed. Follow-up by: Sydell Axon LPN,  November 19, 2009 5:05 PM    Prescriptions: HYDROCODONE-ACETAMINOPHEN 5-325 MG  TABS (HYDROCODONE-ACETAMINOPHEN) 1-2 by mouth q 4-6 hours as needed pain  #90 x 0   Entered and Authorized by:   Judith Part MD   Signed by:   Sydell Axon LPN on 45/40/9811   Method used:   Telephoned to ...       [facility]  [address] (retail)       [address]       [place], [place]  [address]       Ph: [phone] or [phone]       Fax: [phone]   RxID:   [id number]

## 2010-03-23 NOTE — Progress Notes (Signed)
Summary: HYDROCODONE-ACETAMINOPHEN  Phone Note Refill Request Message from:  Patient on October 23, 2009 9:22 AM  Refills Requested: Medication #1:  HYDROCODONE-ACETAMINOPHEN 5-325 MG  TABS 1-2 by mouth q 4-6 hours as needed pain   Last Refilled: 09/07/2009 Refill request from [place]. [phone]  Initial call taken by: Melody Comas,  October 23, 2009 9:23 AM  Follow-up for Phone Call        correction- please [facility] not [facility]. thanks.  Follow-up by: Crawford Givens MD,  October 23, 2009 9:47 AM  Additional Follow-up for Phone Call Additional follow up Details #1::        Medication phoned to pharmacy.  Additional Follow-up by: Delilah Shan CMA Lilou Kneip Dull),  October 23, 2009 12:26 PM    Prescriptions: HYDROCODONE-ACETAMINOPHEN 5-325 MG  TABS (HYDROCODONE-ACETAMINOPHEN) 1-2 by mouth q 4-6 hours as needed pain  #90 x 0   Entered and Authorized by:   Crawford Givens MD   Signed by:   Crawford Givens MD on 10/23/2009   Method used:   Telephoned to ...       [facility]  [address] (retail)       [address]       [place], [place]  [address]       Ph: [phone] or [phone]       Fax: [phone]   RxID:   [id number]

## 2010-03-23 NOTE — Progress Notes (Signed)
Summary: Hydrocodone/APAP 5-325mg    Phone Note Refill Request Call back at [phone] Message from:  [facility] on September 07, 2009 4:22 PM  Refills Requested: Medication #1:  HYDROCODONE-ACETAMINOPHEN 5-325 MG  TABS 1-2 by mouth q 4-6 hours as needed pain   Last Refilled: 06/23/2009 [facility] faxed refill request for Hydrocodone APAP 5-325mg .Please advise.    Method Requested: Telephone to Pharmacy Initial call taken by: Lewanda Rife LPN,  September 07, 2009 4:23 PM  Follow-up for Phone Call        px written on EMR for call in  Follow-up by: Judith Part MD,  September 07, 2009 4:55 PM  Additional Follow-up for Phone Call Additional follow up Details #1::        Medication phoned to [facility] pharmacy as instructed. Lewanda Rife LPN  September 07, 2009 5:23 PM     Prescriptions: HYDROCODONE-ACETAMINOPHEN 5-325 MG  TABS (HYDROCODONE-ACETAMINOPHEN) 1-2 by mouth q 4-6 hours as needed pain  #90 x 0   Entered and Authorized by:   Judith Part MD   Signed by:   Lewanda Rife LPN on 45/40/9811   Method used:   Telephoned to ...       [facility]  [address] (retail)       [address]       [place], [place]  [address]       Ph: [phone] or [phone]       Fax: [phone]   RxID:   [id number]

## 2010-03-23 NOTE — Progress Notes (Signed)
Summary: idoderm patches  Phone Note Refill Request Call back at [phone] Message from:  [facility] on March 30, 2009 4:13 PM  Refills Requested: Medication #1:  LIDODERM 5 % PTCH apply up to 3 patches for 12 hours. [facility] faxed a new prescription request for Lidoderm patches.Please advise.    Method Requested: Electronic Initial call taken by: Lewanda Rife LPN,  March 30, 2009 4:14 PM  Follow-up for Phone Call        px written on EMR for call in  Follow-up by: Judith Part MD,  March 30, 2009 4:25 PM  Additional Follow-up for Phone Call Additional follow up Details #1::        Called to [facility]. Additional Follow-up by: Lowella Petties CMA,  March 30, 2009 4:35 PM    New/Updated Medications: LIDODERM 5 % PTCH (LIDOCAINE) apply up to 3 patches for 12 hours. as directed Prescriptions: LIDODERM 5 % PTCH (LIDOCAINE) apply up to 3 patches for 12 hours. as directed  #1 month x 5   Entered and Authorized by:   Judith Part MD   Signed by:   Judith Part MD on 03/30/2009   Method used:   Telephoned to ...       [facility]  [address] (retail)       [address]       [place], [place]  [address]       Ph: [phone] or [phone]       Fax: [phone]   RxID:   [id number]

## 2010-03-23 NOTE — Progress Notes (Signed)
Summary: Prior Authorization for Nexium  Phone Note From Pharmacy Call back at [phone]   Caller: [employer] Call For: Dr. Milinda Antis  Summary of Call: Received forms for prior authorization for Nexium.  Forms in your IN box.  Follow-up for Phone Call        I filled out what I could  insurance wants to know why she needs such frequent/ high dose of nexium (I think I inherited her on this ..) also needs documentation of what meds she tried and failed for her gerd and peptic ulcer dz please ask her son and send back to me - thanks  Follow-up by: Judith Part MD,  March 06, 2009 10:00 AM  Additional Follow-up for Phone Call Additional follow up Details #1::        Pt's son says she has tried and failed prilosec, protonix, zantac- and cant remember what others.  He said she takes 3 nexium a day because of her bad reflux, ulcer and gastritis.   Form is back on your desk.  thanks - that is what I needed to know  Additional Follow-up by: Lowella Petties CMA,  March 06, 2009 10:14 AM    Additional Follow-up for Phone Call Additional follow up Details #2::    Form faxed.            Lowella Petties CMA  March 06, 2009 11:42 AM

## 2010-03-25 NOTE — Progress Notes (Signed)
Summary: regarding maxzide  Phone Note Refill Request Message from:  Pharmacy  Refills Requested: Medication #1:  TRIAMTERENE-HCTZ 75-50 MG TABS take 1/2  by mouth daily Refill was sent to [facility].  They called and questioned dose, what they have- and what pt has been taking in one a day, chart says one half a day.  Advised them to continue with one a day and that pt needs to be seen before further refills.  They will advise pt to call for appt.  Initial call taken by: Lowella Petties CMA, AAMA,  March 03, 2010 5:02 PM  Follow-up for Phone Call        continue what she has been taking and I will change in chart px written on EMR for call in  I know it is hard to get her here - but she is due for a f/u and labs to check kidney function-- winter or early spring is fine Follow-up by: Judith Part MD,  March 03, 2010 5:08 PM  Additional Follow-up for Phone Call Additional follow up Details #1::        Rob at [facility] notified as instructed by telephone. Rob said he would make sure they knew that they need to call for appt.Lewanda Rife LPN  March 03, 2010 5:13 PM     New/Updated Medications: TRIAMTERENE-HCTZ 75-50 MG TABS (TRIAMTERENE-HCTZ) take 1 by mouth daily Prescriptions: TRIAMTERENE-HCTZ 75-50 MG TABS (TRIAMTERENE-HCTZ) take 1 by mouth daily  #30 x 3   Entered and Authorized by:   Judith Part MD   Signed by:   Lewanda Rife LPN on 62/95/2841   Method used:   Telephoned to ...       [facility] ([facility]ail[facility]       [address]       [place], [place]  [address]       Ph: [phone] or [phone]       Fax: [phone]   RxID:   [id number]

## 2010-03-25 NOTE — Progress Notes (Signed)
Summary: refill request for vicodin  Phone Note Refill Request Message from:  Fax from Pharmacy  Refills Requested: Medication #1:  HYDROCODONE-ACETAMINOPHEN 5-325 MG  TABS 1-2 by mouth q 4-6 hours as needed pain   Last Refilled: 02/23/2010 Faxed request from [place], [phone].  Initial call taken by: Lowella Petties CMA, AAMA,  February 23, 2010 9:30 AM  Follow-up for Phone Call        px written on EMR for call in  Follow-up by: Judith Part MD,  February 23, 2010 9:44 AM  Additional Follow-up for Phone Call Additional follow up Details #1::        Medication phoned to [facility]  pharmacy as instructed. Lewanda Rife LPN  February 23, 2010 12:26 PM     New/Updated Medications: HYDROCODONE-ACETAMINOPHEN 5-325 MG  TABS (HYDROCODONE-ACETAMINOPHEN) 1-2 by mouth q 4-6 hours as needed pain Prescriptions: HYDROCODONE-ACETAMINOPHEN 5-325 MG  TABS (HYDROCODONE-ACETAMINOPHEN) 1-2 by mouth q 4-6 hours as needed pain  #90 x 0   Entered and Authorized by:   Judith Part MD   Signed by:   Sydell Axon LPN on 62/95/2841   Method used:   Telephoned to ...       [facility] [facility]ail[facility]       [address]       [place], [place]  [address]       Ph: [phone] or [phone]       Fax: [phone]   RxID:   [id number]

## 2010-03-26 ENCOUNTER — Telehealth: Payer: Self-pay | Admitting: Family Medicine

## 2010-03-31 ENCOUNTER — Encounter: Payer: Self-pay | Admitting: Family Medicine

## 2010-03-31 NOTE — Progress Notes (Signed)
Summary: refill request for vicodin  Phone Note Refill Request Message from:  Fax from Pharmacy  Refills Requested: Medication #1:  HYDROCODONE-ACETAMINOPHEN 5-325 MG  TABS 1-2 by mouth q 4-6 hours as needed pain   Last Refilled: 02/23/2010 Faxed request from [place], [phone].  Pt does not have appt scheduled yet.  Initial call taken by: Lowella Petties CMA, AAMA,  March 23, 2010 8:17 AM  Follow-up for Phone Call        px written on EMR for call in  Follow-up by: Judith Part MD,  March 23, 2010 10:28 AM  Additional Follow-up for Phone Call Additional follow up Details #1::        Medication phoned to [facility]  pharmacy as instructed. Lewanda Rife LPN  March 23, 2010 10:35 AM     New/Updated Medications: HYDROCODONE-ACETAMINOPHEN 5-325 MG  TABS (HYDROCODONE-ACETAMINOPHEN) 1-2 by mouth q 4-6 hours as needed pain Prescriptions: HYDROCODONE-ACETAMINOPHEN 5-325 MG  TABS (HYDROCODONE-ACETAMINOPHEN) 1-2 by mouth q 4-6 hours as needed pain  #90 x 0   Entered and Authorized by:   Judith Part MD   Signed by:   Lewanda Rife LPN on 52/84/1324   Method used:   Telephoned to ...       [facility] (retail)       [address]       [place], [place]  [address]       Ph: [phone] or [phone]       Fax: [phone]   RxID:   [id number]

## 2010-04-07 ENCOUNTER — Ambulatory Visit (INDEPENDENT_AMBULATORY_CARE_PROVIDER_SITE_OTHER): Payer: Self-pay | Admitting: Family Medicine

## 2010-04-07 ENCOUNTER — Encounter: Payer: Self-pay | Admitting: Family Medicine

## 2010-04-07 ENCOUNTER — Other Ambulatory Visit: Payer: Self-pay | Admitting: Family Medicine

## 2010-04-07 DIAGNOSIS — E559 Vitamin D deficiency, unspecified: Secondary | ICD-10-CM

## 2010-04-07 DIAGNOSIS — R5383 Other fatigue: Secondary | ICD-10-CM

## 2010-04-07 DIAGNOSIS — G8929 Other chronic pain: Secondary | ICD-10-CM

## 2010-04-07 DIAGNOSIS — E7800 Pure hypercholesterolemia, unspecified: Secondary | ICD-10-CM

## 2010-04-07 DIAGNOSIS — E785 Hyperlipidemia, unspecified: Secondary | ICD-10-CM

## 2010-04-07 DIAGNOSIS — E119 Type 2 diabetes mellitus without complications: Secondary | ICD-10-CM

## 2010-04-07 DIAGNOSIS — I10 Essential (primary) hypertension: Secondary | ICD-10-CM

## 2010-04-07 DIAGNOSIS — N19 Unspecified kidney failure: Secondary | ICD-10-CM

## 2010-04-07 DIAGNOSIS — M109 Gout, unspecified: Secondary | ICD-10-CM

## 2010-04-07 DIAGNOSIS — R413 Other amnesia: Secondary | ICD-10-CM

## 2010-04-07 DIAGNOSIS — E039 Hypothyroidism, unspecified: Secondary | ICD-10-CM

## 2010-04-07 LAB — HM DIABETES FOOT EXAM

## 2010-04-07 LAB — RENAL FUNCTION PANEL
BUN: 44 mg/dL — ABNORMAL HIGH (ref 6–23)
CO2: 25 mEq/L (ref 19–32)
Creatinine, Ser: 2.2 mg/dL — ABNORMAL HIGH (ref 0.4–1.2)
GFR: 23.02 mL/min — ABNORMAL LOW (ref >60.00)

## 2010-04-07 LAB — HEMOGLOBIN A1C: Hgb A1c MFr Bld: 6.3 % (ref 4.6–6.5)

## 2010-04-07 LAB — LIPID PANEL
Cholesterol: 189 mg/dL (ref 0–200)
HDL: 34.4 mg/dL — ABNORMAL LOW (ref >39.00)
Total CHOL/HDL Ratio: 5
Triglycerides: 300 mg/dL — ABNORMAL HIGH (ref 0.0–149.0)
VLDL: 60 mg/dL — ABNORMAL HIGH (ref 0.0–40.0)

## 2010-04-07 LAB — HEPATIC FUNCTION PANEL
AST: 15 U/L (ref 0–37)
Total Bilirubin: 0.4 mg/dL (ref 0.3–1.2)

## 2010-04-07 LAB — LDL CHOLESTEROL, DIRECT: Direct LDL: 105.2 mg/dL

## 2010-04-07 LAB — TSH: TSH: 0.12 u[IU]/mL — ABNORMAL LOW (ref 0.35–5.50)

## 2010-04-08 LAB — CBC WITH DIFFERENTIAL/PLATELET
Basophils Absolute: 0 10*3/uL (ref 0.0–0.1)
HCT: 33.6 % — ABNORMAL LOW (ref 36.0–46.0)
Lymphs Abs: 3.7 10*3/uL (ref 0.7–4.0)
Monocytes Absolute: 0.6 10*3/uL (ref 0.1–1.0)
Monocytes Relative: 6.9 % (ref 3.0–12.0)
Platelets: 212 10*3/uL (ref 150.0–400.0)
RDW: 16 % — ABNORMAL HIGH (ref 11.5–14.6)

## 2010-04-08 NOTE — Progress Notes (Signed)
Summary: son wants home health care  Phone Note Call from Patient Call back at Home Phone [phone]   Caller: Son Dominic Summary of Call: Pt is due to come in for follow up visit but her son says she is basically home bound and needs home health care.   He is asking if we can refer her to home health, to see if an RN can visit her on occasion. Initial call taken by: Lowella Petties CMA, AAMA,  March 26, 2010 11:26 AM  Follow-up for Phone Call        what things do you need help with -- bathing/dressing/ med admin/ meal prep -- transfers? -- this info will help me, thanks Follow-up by: Judith Part MD,  March 26, 2010 1:33 PM  Additional Follow-up for Phone Call Additional follow up Details #1::        Left message for patient to call back. Lewanda Rife LPN  March 26, 2010 2:49 PM   Patient's son  notified as instructed by telephone. Dominic said pt is home bound and moves around home with walker and w/c. Pt needs some help with dressing. Son states he helps pt with dressing, son cooks and has pt's med under lock and key and son gives pt her meds. Pt's son concerned that pt's sugar level can bottom out very quickly. Dominic watches pt after insulin injection to make sure does not go into stuper or does not do something reckless. ( Son put locked gate on stairway in their home.) Son is wondering if nurse could visit monthly to do general ck of pt and possible lab work to keep ck on sugar since pt is homebound.Please advise. Lewanda Rife LPN  March 29, 2010 10:04 AM     Additional Follow-up for Phone Call Additional follow up Details #2::    thanks - I will do home care referral and see if we can get it covered  Follow-up by: Judith Part MD,  March 29, 2010 11:24 AM

## 2010-04-14 NOTE — Assessment & Plan Note (Signed)
Summary: FU for [facility] Eval/mk [employer]   Vital Signs:  Patient profile:   77 year old female Height:      57 inches Weight:      127.25 pounds BMI:     27.64 Temp:     98 degrees F oral Pulse rate:   64 / minute Pulse rhythm:   regular BP sitting:   104 / 50  (right arm) Cuff size:   regular  Vitals Entered By: Lewanda Rife LPN (April 07, 2010 11:15 AM) CC: Home Health health evaluation visit. and Pt's son wants to talk about pt's insulin (occasionally she bottoms out )   History of Present Illness: pt here to disc poss home health ref and also DM   did ref in past   she has mobility issues and memory loss   because of memory loss and easy confusion - has meds locked- and son administers them   severe degenerative joint dz and gout   generally is in wheelchair -- with occ transfers to recliner most of the day   actively uses walker total 2 hours maximum pain is the limiting factor  feet and legs and ankles and hands  really hurts to walk   is homebound - cannot drive and son cares for her   dressing  -- needs help -- takes 45-60 minutes to get dressed in nl outfit  absolutely needs assistance with sleeves and shoes and socks   occ incontinence of urine- cannot get there fast enough no incontinence of stool  bathing - uses her walker in a large bathroom -- can do sponge bath -- but cannot use tub at all due to balance problems , also avoids slippery surfaces   cooking -- food prep totally by her son -- she can not stand long enough     lost 10 lb -- appetite is good overall  gets full faster than she used to  she always eats with her son   checks sugars two times a day - in am , and then in afternoon , occ at 8 pm  average sugars are 124-139-- overall well controlled occ spike if she eats the wrong thing  holds her humulin if she has sugar 120-139 -- then does not go too high (is on a sliding scale)  due for [facility] today needs tsh   due for vit D check for  deficiency as well  no longer sees renal but wishes to check cr today - no symptoms         Allergies: 1)  ! Vioxx 2)  ! * Namenda 10 Mg 3)  ! Aricept 4)  ! Aricept 5)  ! * Namenda 6)  ! * Calcium 7)  Penicillin 8)  Sulfa 9)  Lasix 10)  Percodan 11)  * Lyrica  Past History:  Past Medical History: Last updated: 07/02/2008 Diabetes mellitus, type II GERD Hypertension Hypothyroidism Osteoarthritis Peptic ulcer disease Renal failure- secondary to vioxx Elevated pancreatic enzymes Blood transfusion colon polyps pedal edema gout  short term memory loss  B12 deficiency vit D deficiency  Past Surgical History: Last updated: 08/04/2006 Appendectomy (1928) Cataract extraction Cholecystectomy (1956) Thyroidectomy / throat surgery- cold nodule Tonsillectomy Toe amputation x 2- hammer toes Toe nail procedure Hosp- ische colitis (2004) EGD- stricture Colon screen (2004) colonosc 2008 polyps (re check 5 y)  Family History: Last updated: 07/02/2007 Father: Hypercholesterol Mother: son tias, depression  Siblings:  Grand parents- Arthritis,parents  Social History: Last updated: 07/02/2007 Marital  Status: widowed Children:  Occupation: retired has 4 dogs lives with her son   Risk Factors: Smoking Status: never (08/02/2006)  Review of Systems General:  Denies fatigue, loss of appetite, and malaise. Eyes:  Denies blurring and eye irritation. CV:  Complains of shortness of breath with exertion and swelling of feet; denies chest pain or discomfort and palpitations. Resp:  Denies cough and wheezing. GI:  Denies abdominal pain, bloody stools, and change in bowel habits. MS:  Complains of joint pain, joint swelling, low back pain, mid back pain, muscle aches, cramps, muscle weakness, stiffness, and thoracic pain; denies joint redness. Derm:  Denies itching, lesion(s), poor wound healing, and rash. Neuro:  Complains of difficulty with concentration,  disturbances in coordination, falling down, memory loss, numbness, tingling, and weakness. Psych:  Denies anxiety and depression. Endo:  Denies cold intolerance, excessive thirst, excessive urination, and heat intolerance. Heme:  Denies abnormal bruising and bleeding.  Physical Exam  General:  frail elderly female in wheelchair  Head:  normocephalic, atraumatic, and no abnormalities observed.   Eyes:  vision grossly intact, pupils equal, pupils round, and pupils reactive to light.   Mouth:  pharynx pink and moist.   Neck:  supple with full rom and no masses or thyromegally, no JVD or carotid bruit  Chest Wall:  No deformities, masses, or tenderness noted. Lungs:  CTA with good air exch Heart:  Normal rate and regular rhythm. S1 and S2 normal without gallop, murmur, click, rub or other extra sounds. Abdomen:  soft, non-tender, and normal bowel sounds.  no renal bruits  Msk:  No deformity or scoliosis noted of thoracic or lumbar spine.   changes of OA in all peripheral joints  some kyphosis   unable to stand without assistance unable to walk wihtout walker or assistance can take 3 steps with walker before needing to rest   toes overlap- much oa  small corn between L 4th/5th toes - is healing   Pulses:  R and L carotid,radial,femoral,dorsalis pedis and posterior tibial pulses are full and equal bilaterally Extremities:  mild pendal edema varicosities present Neurologic:  sensation intact to light touch, DTRs  symmetrical and normal.   4/5 strength in all ext  limited passive rom of ext due to pain  Skin:  Intact without suspicious lesions or rashes Cervical Nodes:  No lymphadenopathy noted Psych:  pleasant  occasionally repeats herself   Diabetes Management Exam:    Foot Exam (with socks and/or shoes not present):       Sensory-Pinprick/Light touch:          Left medial foot (L-4): diminished          Left dorsal foot (L-5): diminished          Left lateral foot (S-1):  diminished          Right medial foot (L-4): diminished          Right dorsal foot (L-5): diminished          Right lateral foot (S-1): diminished   Impression & Recommendations:  Problem # 1:  OTHER CHRONIC PAIN (ICD-338.29) Assessment Deteriorated ongoing with gout and severe OA is limited mobility wise and homebound needing more help with ADLs  ref for home health Orders: Home Health Referral (Home Health)  Problem # 2:  MEMORY LOSS (ICD-780.93) Assessment: Unchanged this is fairly stable has to have meds administered and needs care around the clock Orders: Home Health Referral [facility])  Problem # 3:  UNSPECIFIED  VITAMIN D DEFICIENCY (ICD-268.9) Assessment: Unchanged check level on current otc dose pt does have bone loss  Orders: Venipuncture (04540) TLB-Lipid Panel (80061-LIPID) TLB-Renal Function Panel (80069-RENAL) TLB-CBC Platelet - w/Differential (85025-CBCD) TLB-Hepatic/Liver Function Pnl (80076-HEPATIC) TLB-TSH (Thyroid Stimulating Hormone) (84443-TSH) TLB-A1C / Hgb A1C (Glycohemoglobin) (83036-A1C) T-Vitamin D (25-Hydroxy) (98119-14782)  Problem # 4:  RENAL FAILURE (ICD-586) Assessment: Unchanged pt chooses at this time not to see renal unless severely worse no symptoms  lab today Orders: Venipuncture (95621) TLB-Lipid Panel (80061-LIPID) TLB-Renal Function Panel (80069-RENAL) TLB-CBC Platelet - w/Differential (85025-CBCD) TLB-Hepatic/Liver Function Pnl (80076-HEPATIC) TLB-TSH (Thyroid Stimulating Hormone) (84443-TSH) TLB-A1C / Hgb A1C (Glycohemoglobin) (83036-A1C) T-Vitamin D (25-Hydroxy) (30865-78469)  Problem # 5:  HYPOTHYROIDISM (ICD-244.9) Assessment: Unchanged  no clinical changes except fatigue check tsh and adj dose as needed  Her updated medication list for this problem includes:    Levothroid 100 Mcg Tabs (Levothyroxine sodium) .Marland Kitchen... 1 by mouth once daily  Orders: Venipuncture (62952) TLB-Lipid Panel (80061-LIPID) TLB-Renal  Function Panel (80069-RENAL) TLB-CBC Platelet - w/Differential (85025-CBCD) TLB-Hepatic/Liver Function Pnl (80076-HEPATIC) TLB-TSH (Thyroid Stimulating Hormone) (84443-TSH) TLB-A1C / Hgb A1C (Glycohemoglobin) (83036-A1C) T-Vitamin D (25-Hydroxy) (84132-44010)  Labs Reviewed: TSH: 1.66 (01/09/2009)    HgBA1c: 6.8 (01/09/2009) Chol: 179 (06/25/2008)   HDL: 29.40 (06/25/2008)   TG: 485.0 (06/25/2008)  Problem # 6:  HYPERTENSION (ICD-401.9) Assessment: Unchanged very well controlled without change  would cut back on med if pt experienced orthostasis Her updated medication list for this problem includes:    Lisinopril 10 Mg Tabs (Lisinopril) .Marland Kitchen... Take one by mouth daily    Triamterene-hctz 75-50 Mg Tabs (Triamterene-hctz) .Marland Kitchen... Take 1 by mouth daily    Amlodipine Besylate 10 Mg Tabs (Amlodipine besylate) ..... One tab by mouth once daily  Orders: Venipuncture (27253) TLB-Lipid Panel (80061-LIPID) TLB-Renal Function Panel (80069-RENAL) TLB-CBC Platelet - w/Differential (85025-CBCD) TLB-Hepatic/Liver Function Pnl (80076-HEPATIC) TLB-TSH (Thyroid Stimulating Hormone) (84443-TSH) TLB-A1C / Hgb A1C (Glycohemoglobin) (83036-A1C) T-Vitamin D (25-Hydroxy) (66440-34742)  BP today: 104/50 Prior BP: 138/70 (02/09/2009)  Labs Reviewed: K+: 4.2 (01/09/2009) Creat: : 1.6 (01/09/2009)   Chol: 179 (06/25/2008)   HDL: 29.40 (06/25/2008)   TG: 485.0 (06/25/2008)  Problem # 7:  DIABETES MELLITUS, TYPE II (ICD-250.00) Assessment: Unchanged  this is well controlled lab today  agreed to hold humulin for sugars below 130s and continue to watch closely Her updated medication list for this problem includes:    Lisinopril 10 Mg Tabs (Lisinopril) .Marland Kitchen... Take one by mouth daily    Humulin 70/30 Susp (Insulin isophane & reg (human) susp) ..... Use as directed  Orders: Venipuncture (59563) TLB-Lipid Panel (80061-LIPID) TLB-Renal Function Panel (80069-RENAL) TLB-CBC Platelet - w/Differential  (85025-CBCD) TLB-Hepatic/Liver Function Pnl (80076-HEPATIC) TLB-TSH (Thyroid Stimulating Hormone) (84443-TSH) TLB-A1C / Hgb A1C (Glycohemoglobin) (83036-A1C) T-Vitamin D (25-Hydroxy) [phone])  Labs Reviewed: Creat: 1.6 (01/09/2009)    Reviewed HgBA1c results: 6.8 (01/09/2009)  6.7 (06/25/2008)  Complete Medication List: 1)  Nexium 40 Mg Cpdr (Esomeprazole magnesium) .... Take 1 tablet by mouth two times a day 2)  Lisinopril 10 Mg Tabs (Lisinopril) .... Take one by mouth daily 3)  Triamterene-hctz 75-50 Mg Tabs (Triamterene-hctz) .... Take 1 by mouth daily 4)  Levothroid 100 Mcg Tabs (Levothyroxine sodium) .Marland Kitchen.. 1 by mouth once daily 5)  Humulin 70/30 Susp (Insulin isophane & reg (human) susp) .... Use as directed 6)  Hydrocodone-acetaminophen 5-325 Mg Tabs (Hydrocodone-acetaminophen) .Marland Kitchen.. 1-2 by mouth q 4-6 hours as needed pain 7)  Bd Insulin Syringe Ultrafine 30g X 1/2" 0.5 Ml Misc (Insulin syringe-needle u-100) .... Use  as directed 8)  Amlodipine Besylate 10 Mg Tabs (Amlodipine besylate) .... One tab by mouth once daily 9)  Allopurinol 100 Mg Tabs (Allopurinol) .... 2 tabs by mouth daily 10)  Lidoderm 5 % Ptch (Lidocaine) .... Apply up to 3 patches for 12 hours. as directed 11)  Vitamin B12 1000 Mcg  .... One shot weekly for 4 weeks 12)  Support Stockings To The Knee 15 Mm Hg  .... To wear during the day for pedal edema 782.3 with venous insufficency 13)  Compression Stockings  .Marland KitchenMarland Kitchen. 15-20 mm hg 14)  Vitamin D 2000 Unit Tabs (Cholecalciferol) .... Take 1 tablet by mouth once a day  Patient Instructions: 1)  we will do home care referral at check out  2)  labs today 3)  it is ok to hold insulin if sugar is lower than 130s -- stick with sliding scale otherwise    Orders Added: 1)  Venipuncture [36415] 2)  TLB-Lipid Panel [80061-LIPID] 3)  TLB-Renal Function Panel [80069-RENAL] 4)  TLB-CBC Platelet - w/Differential [85025-CBCD] 5)  TLB-Hepatic/Liver Function Pnl  [80076-HEPATIC] 6)  TLB-TSH (Thyroid Stimulating Hormone) [84443-TSH] 7)  TLB-A1C / Hgb A1C (Glycohemoglobin) [83036-A1C] 8)  T-Vitamin D (25-Hydroxy) [04540-98119] 9)  Home Health Referral [Home Health] 10)  Est. Patient Level V [14782]    Current Allergies (reviewed today): ! VIOXX ! * NAMENDA 10 MG ! ARICEPT ! ARICEPT ! * NAMENDA ! * CALCIUM PENICILLIN SULFA LASIX PERCODAN * LYRICA

## 2010-04-19 ENCOUNTER — Encounter: Payer: Self-pay | Admitting: Family Medicine

## 2010-04-19 DIAGNOSIS — E119 Type 2 diabetes mellitus without complications: Secondary | ICD-10-CM

## 2010-04-19 DIAGNOSIS — I10 Essential (primary) hypertension: Secondary | ICD-10-CM

## 2010-04-19 DIAGNOSIS — M159 Polyosteoarthritis, unspecified: Secondary | ICD-10-CM

## 2010-04-19 DIAGNOSIS — M109 Gout, unspecified: Secondary | ICD-10-CM

## 2010-04-20 NOTE — Miscellaneous (Signed)
Summary: Controlled Substances contract  Controlled Substances contract   Imported By: Maryln Gottron 04/12/2010 15:40:35  _____________________________________________________________________  External Attachment:    Type:   Image     Comment:   External Document

## 2010-04-23 ENCOUNTER — Ambulatory Visit (INDEPENDENT_AMBULATORY_CARE_PROVIDER_SITE_OTHER): Payer: Medicare Other | Admitting: Family Medicine

## 2010-04-23 ENCOUNTER — Other Ambulatory Visit (INDEPENDENT_AMBULATORY_CARE_PROVIDER_SITE_OTHER): Payer: Medicare Other

## 2010-04-23 ENCOUNTER — Encounter (INDEPENDENT_AMBULATORY_CARE_PROVIDER_SITE_OTHER): Payer: Self-pay | Admitting: *Deleted

## 2010-04-23 ENCOUNTER — Encounter: Payer: Self-pay | Admitting: Family Medicine

## 2010-04-23 ENCOUNTER — Other Ambulatory Visit: Payer: Self-pay | Admitting: Family Medicine

## 2010-04-23 DIAGNOSIS — E875 Hyperkalemia: Secondary | ICD-10-CM

## 2010-04-23 DIAGNOSIS — R609 Edema, unspecified: Secondary | ICD-10-CM

## 2010-04-23 LAB — POTASSIUM: Potassium: 4.5 mEq/L (ref 3.5–5.1)

## 2010-04-29 ENCOUNTER — Telehealth: Payer: Self-pay | Admitting: Family Medicine

## 2010-04-29 NOTE — Assessment & Plan Note (Signed)
Summary: LEGS SWOLLEN AND RED   Vital Signs:  Patient profile:   77 year old female Weight:      123 pounds Temp:     98.4 degrees F oral Pulse rate:   64 / minute Pulse rhythm:   regular BP sitting:   138 / 64  (left arm) Cuff size:   regular  Vitals Entered By: Selena Batten Dance CMA Duncan Dull) (April 23, 2010 2:23 PM) CC: Bilat. LE edema   History of Present Illness: CC: BLE edema, R leg warm  presents with son, [name] who is caregiver.  68 yo new to me with h/o CVI (Cr last week 2.2), OA, CRI, T2DM, HTN, HLD, gout, PUD.  having issues with edema for quite some time.  Uses compression stockings at home.  RN came to her house today and noticed worsening swelling as well as R leg slightly warm/red.  Pt overall feeling ok, does note that R leg hurting more.  No CP/tightness, SOB.  never had blood clots.  Has had gout in past, no cellulitis.  has lost toes bilaterally 2/2 hammer toes.  Current Medications (verified): 1)  Nexium 40 Mg Cpdr (Esomeprazole Magnesium) .... Take 1 Tablet By Mouth Two Times A Day 2)  Lisinopril 10 Mg Tabs (Lisinopril) .... Take One By Mouth Daily 3)  Triamterene-Hctz 75-50 Mg Tabs (Triamterene-Hctz) .... Take 1 By Mouth Daily 4)  Levothroid 100 Mcg Tabs (Levothyroxine Sodium) .Marland Kitchen.. 1 By Mouth Once Daily 5)  Humulin 70/30  Susp (Insulin Isophane & Reg (Human) Susp) .... Use As Directed 6)  Hydrocodone-Acetaminophen 5-325 Mg  Tabs (Hydrocodone-Acetaminophen) .Marland Kitchen.. 1-2 By Mouth Q 4-6 Hours As Needed Pain 7)  Bd Insulin Syringe Ultrafine 30g X 1/2" 0.5 Ml  Misc (Insulin Syringe-Needle U-100) .... Use As Directed 8)  Amlodipine Besylate 10 Mg Tabs (Amlodipine Besylate) .... One Tab By Mouth Once Daily 9)  Allopurinol 100 Mg Tabs (Allopurinol) .... 2 Tabs By Mouth Daily 10)  Lidoderm 5 % Ptch (Lidocaine) .... Apply Up To 3 Patches For 12 Hours. As Directed 11)  Vitamin B12 1000 Mcg .... One Shot Weekly For 4 Weeks 12)  Support Stockings To The Knee 15 Mm Hg .... To Wear  During The Day For Pedal Edema 782.3 With Venous Insufficency 13)  Compression Stockings .Marland KitchenMarland Kitchen. 15-20 Mm Hg 14)  Vitamin D 2000 Unit Tabs (Cholecalciferol) .... Take 1 Tablet By Mouth Once A Day  Allergies: 1)  ! Vioxx 2)  ! * Namenda 10 Mg 3)  ! Aricept 4)  ! * Namenda 5)  ! * Calcium 6)  ! Ace Inhibitors 7)  Penicillin 8)  Sulfa 9)  Lasix 10)  Percodan 11)  * Lyrica  Past History:  Past Medical History: Last updated: 07/02/2008 Diabetes mellitus, type II GERD Hypertension Hypothyroidism Osteoarthritis Peptic ulcer disease Renal failure- secondary to vioxx Elevated pancreatic enzymes Blood transfusion colon polyps pedal edema gout  short term memory loss  B12 deficiency vit D deficiency  Social History: Last updated: 07/02/2007 Marital Status: widowed Children:  Occupation: retired has 4 dogs lives with her son   Review of Systems       per HPI  Physical Exam  General:  frail elderly female on rolling walker Lungs:  CTA, normal WOB Pulses:  2+ DP/PT bilat Extremities:  2+ pedal edema R.  + mild erythema and warmth R lower leg anteriorly mid leg 1+ pedal edema left swelling R>L no palp cords. Neurologic:  sensation intact.   Impression &  Recommendations:  Problem # 1:  PEDAL EDEMA (ICD-782.3) Assessment Deteriorated  recent deterioration R>L. Per son chronic issue, R always worse than left.   Likely due to CVI.  Doubt DVT as no calf tenderness.  doubt cellulitis currently. last check blood work stable for her.  Cr 2.2. Given erythema, cannot rule out infection but very mild if any currently.   rec continued compression, elevation, rest, low salt diet.   advised if redness, warmth, pain worsening or fevers, fill abx to take.  return next week for f/u.   Her updated medication list for this problem includes:    Triamterene-hctz 75-50 Mg Tabs (Triamterene-hctz) .Marland Kitchen... Take 1 by mouth daily  Complete Medication List: 1)  Nexium 40 Mg Cpdr  (Esomeprazole magnesium) .... Take 1 tablet by mouth two times a day 2)  Triamterene-hctz 75-50 Mg Tabs (Triamterene-hctz) .... Take 1 by mouth daily 3)  Levothroid 100 Mcg Tabs (Levothyroxine sodium) .Marland Kitchen.. 1 by mouth once daily 4)  Humulin 70/30 Susp (Insulin isophane & reg (human) susp) .... Use as directed 5)  Hydrocodone-acetaminophen 5-325 Mg Tabs (Hydrocodone-acetaminophen) .Marland Kitchen.. 1-2 by mouth q 4-6 hours as needed pain 6)  Bd Insulin Syringe Ultrafine 30g X 1/2" 0.5 Ml Misc (Insulin syringe-needle u-100) .... Use as directed 7)  Amlodipine Besylate 10 Mg Tabs (Amlodipine besylate) .... One tab by mouth once daily 8)  Allopurinol 100 Mg Tabs (Allopurinol) .... 2 tabs by mouth daily 9)  Lidoderm 5 % Ptch (Lidocaine) .... Apply up to 3 patches for 12 hours. as directed 10)  Vitamin B12 1000 Mcg  .... One shot weekly for 4 weeks 11)  Support Stockings To The Knee 15 Mm Hg  .... To wear during the day for pedal edema 782.3 with venous insufficency 12)  Compression Stockings  .Marland KitchenMarland Kitchen. 15-20 mm hg 13)  Vitamin D 2000 Unit Tabs (Cholecalciferol) .... Take 1 tablet by mouth once a day 14)  Doxycycline Hyclate 100 Mg Caps (Doxycycline hyclate) .... Take one twice daily for 10 days  Patient Instructions: 1)  Not quite sure if it's infection right now. 2)  We will continue to watch. 3)  As it is the weekend, antibiotic to hold on to in case worsening or redness spreading or fevers. 4)  Otherwise, keep as up to now with compression, elevation, and staying away from salt. Prescriptions: DOXYCYCLINE HYCLATE 100 MG CAPS (DOXYCYCLINE HYCLATE) take one twice daily for 10 days  #20 x 0   Entered and Authorized by:   Eustaquio Boyden  MD   Signed by:   Eustaquio Boyden  MD on 04/23/2010   Method used:   Print then Give to Patient   RxID:   [id number]    Orders Added: 1)  Est. Patient Level III [41324]    Current Allergies (reviewed today): ! VIOXX ! * NAMENDA 10 MG ! ARICEPT ! * NAMENDA ! *  CALCIUM ! ACE INHIBITORS PENICILLIN SULFA LASIX PERCODAN * LYRICA

## 2010-04-29 NOTE — Miscellaneous (Signed)
Summary: Certification and Plan of Treatment/[facility]  Certification and Plan of Treatment/[facility]   Imported By: Maryln Gottron 04/21/2010 10:40:12  _____________________________________________________________________  External Attachment:    Type:   Image     Comment:   External Document

## 2010-05-04 ENCOUNTER — Telehealth: Payer: Self-pay | Admitting: Family Medicine

## 2010-05-04 NOTE — Progress Notes (Signed)
Summary: needs verbal order to extend home nursing  Phone Note From Other Clinic   Caller: Kearney Hard with [facility] [phone] Summary of Call: Pt's home health visits have run out and nurse is asking if you want to extend them.  They will have to discharge pt this week if they dont get new verbal order.                        Lowella Petties CMA, AAMA  April 29, 2010 11:12 AM   Follow-up for Phone Call        ok to extend.  plz rout to Dr. Milinda Antis as well. Follow-up by: Eustaquio Boyden  MD,  April 29, 2010 5:00 PM  Additional Follow-up for Phone Call Additional follow up Details #1::        Kearney Hard with [facility] notified as instructed by telephone v/m. Asked Rosey Bath to call back and verify got message. Then will forward to Dr Milinda Antis.Lewanda Rife LPN  April 29, 4538 5:12 PM   Kearney Hard with [facility] as instructed by telephone. Will forward to Dr Milinda Antis.Lewanda Rife LPN  April 29, 9809 9:48 AM

## 2010-05-11 NOTE — Miscellaneous (Signed)
Summary: [facility]  [facility]   Imported By: Kassie Mends 05/05/2010 08:50:39  _____________________________________________________________________  External Attachment:    Type:   Image     Comment:   External Document

## 2010-05-11 NOTE — Progress Notes (Signed)
Summary: discharged from home OT services  Phone Note From Other Clinic   Caller: Sophronia Simas with [facility] [phone] Summary of Call: Therapist called to report that pt has been discharged from home OT services, she is doing well in that aspect. Initial call taken by: Lowella Petties CMA, AAMA,  May 04, 2010 12:23 PM  Follow-up for Phone Call        thanks for the update Follow-up by: Judith Part MD,  May 04, 2010 1:35 PM

## 2010-05-19 ENCOUNTER — Encounter: Payer: Self-pay | Admitting: Family Medicine

## 2010-05-20 ENCOUNTER — Ambulatory Visit: Payer: Medicare Other | Admitting: Family Medicine

## 2010-06-04 ENCOUNTER — Telehealth: Payer: Self-pay | Admitting: *Deleted

## 2010-06-04 MED ORDER — DOXYCYCLINE HYCLATE 100 MG PO CAPS: 100.0000 mg | ORAL_CAPSULE | Freq: Two times a day (BID) | ORAL | Status: AC

## 2010-06-04 NOTE — Telephone Encounter (Signed)
Home health nurse called to report that pt's right leg is swollen, red, warm to the touch.  She thinks pt has cellulitis again and is asking that an antibiotic be called to [facility].  She is concerned about the week end coming up and the fact that the leg is warm.  She can be reached at [phone].  Says the left leg looks fine.

## 2010-06-04 NOTE — Telephone Encounter (Signed)
Spoke with Aggie Cosier from [facility].  Redness, swelling, warmth, tender.  4+ pitting edema.  No temperature.  As weekend, called in doxycycline 100mg  bid x 10 days.  If not getting better, advised to come on in for eval.

## 2010-06-07 ENCOUNTER — Telehealth: Payer: Self-pay | Admitting: *Deleted

## 2010-06-07 ENCOUNTER — Encounter: Payer: Self-pay | Admitting: Family Medicine

## 2010-06-07 NOTE — Telephone Encounter (Signed)
Pt's son is asking for a letter stating that the pt is unable to handle her financial affairs. He needs this to take to social security administration.  Please call when ready.  Note requesting letter and copy of POA are on your shelf.

## 2010-06-10 ENCOUNTER — Telehealth: Payer: Self-pay | Admitting: *Deleted

## 2010-06-10 NOTE — Telephone Encounter (Signed)
It is not a bad idea - please talk to her son about it  ? If she would tolerate the pressure from an unna boot or not? -- but it may actually help with her discomfort Please let me know what he thinks

## 2010-06-10 NOTE — Telephone Encounter (Signed)
Home health nurse states pt's legs are looking terrible.  She says there is so much edema that they are hanging over.  She is asking if you think bilateral unna boots would help.  If so, she says she can go out twice a week to change them.  Or if you thinks something else would work better.  Please let her know.  If ok, order will need to be faxed to [facility] at [phone].

## 2010-06-10 NOTE — Telephone Encounter (Signed)
Thanks - could you please find out from [place] or [place] how I go about ordering unna boots -- to print and fax to home care  Let me know and I will do  Thanks

## 2010-06-10 NOTE — Telephone Encounter (Signed)
Patient's son Dondra Prader notified as instructed by telephone. Pt rubs her legs and cannot tell son why.(?memory issues) Pt's right leg is very swollen and seems painful so son would like home health to try the unna boot. Pt tried mercury socks but they bunched up and caused creases. Dondra Prader said he appreciates all Dr Milinda Antis does for his mother and is OK with trying Radio broadcast assistant.

## 2010-06-11 NOTE — Telephone Encounter (Signed)
Rene Kocher suggested to do home referral but Epic training did not go over orders. Regina suggest asking Dr Sharen Hones.

## 2010-06-11 NOTE — Telephone Encounter (Signed)
Here is an order on px pad for home care to do unna boots Please keep me updated and f/u if needed  I will put in Rena's IN box

## 2010-06-11 NOTE — Telephone Encounter (Signed)
Faxed rx with note to keep Dr Milinda Antis updated on pt and let Dr Milinda Antis know if pt needs to be seen.

## 2010-06-16 NOTE — Telephone Encounter (Signed)
Yes the letter has been written and son notified.

## 2010-06-16 NOTE — Telephone Encounter (Signed)
Artelia Laroche, has this been taken care of?

## 2010-06-27 DIAGNOSIS — M159 Polyosteoarthritis, unspecified: Secondary | ICD-10-CM

## 2010-06-27 DIAGNOSIS — E119 Type 2 diabetes mellitus without complications: Secondary | ICD-10-CM

## 2010-06-27 DIAGNOSIS — I10 Essential (primary) hypertension: Secondary | ICD-10-CM

## 2010-06-27 DIAGNOSIS — I509 Heart failure, unspecified: Secondary | ICD-10-CM

## 2010-06-28 ENCOUNTER — Telehealth: Payer: Self-pay | Admitting: *Deleted

## 2010-06-28 NOTE — Telephone Encounter (Signed)
This does not sound like a bad idea with wosening dementia - we are already concentrating on quality of care  Please ask her son what he thinks of this and let me know

## 2010-06-28 NOTE — Telephone Encounter (Signed)
Patient's son  notified as instructed by telephone. Dondra Prader said he does not have a problem with switch ing to the hospice division as long as pt does not have to leave the home. Dondra Prader said he promised his mother that he would keep her at home unless life threatening.

## 2010-06-28 NOTE — Telephone Encounter (Signed)
That is ok - go ahead and give verbal order to change to hospice division and they can send me whatever paperwork needs to be signed

## 2010-06-28 NOTE — Telephone Encounter (Signed)
Sara Lang has left v/m to call her back. Left v/m on Theresa's phone to call back on 06/29/10.

## 2010-06-28 NOTE — Telephone Encounter (Signed)
Pt's son Dondra Prader called back to ask if pt's condition had worsened to the point of end of life since considering using hospice. I explained that Dr Milinda Antis was concentrating on quality of life. Dondra Prader said he wanted his mom to have the best of care but thought hospice meant end of life was close and he also said he realizes his mom is 60. Dondra Prader said to go ahead and contact Aggie Cosier. I left message for Aggie Cosier to call me back.

## 2010-06-28 NOTE — Telephone Encounter (Signed)
Sara Lang wanted you to know that patient is continuously taking off her edema boots and will not leave them on. Sara Lang says that she is constantly getting called to come out to the house and put them on.  She says that patient's dementia has gotten a lot worse. She is asking if you think it would benefit the patient to be placed in the Hospice division because they do a lot with dementia patients.

## 2010-06-29 NOTE — Telephone Encounter (Signed)
Aggie Cosier nurse with [facility] notified as instructed by telephone. Aggie Cosier will take care of transfer to hospice division.

## 2010-06-30 ENCOUNTER — Telehealth: Payer: Self-pay | Admitting: *Deleted

## 2010-06-30 NOTE — Telephone Encounter (Signed)
Spoke with Johnny Bridge at [phone] and she will have nurse return our call.

## 2010-06-30 NOTE — Telephone Encounter (Signed)
Give the verbal order to d/c unna boots  I need dose and frequency recommendations for the meds -- and will do px

## 2010-06-30 NOTE — Telephone Encounter (Signed)
Hospice nurse called to let you know that pt has been admitted for hospice care under the dx of CHF.  Also, they want order to d/c the unna boots- pt is refusing these and unwraps them.  Also, they are requesting ativan for anxiety, morphine for difficulty breathing- in case it gets worse, and atropine for secretions.

## 2010-07-01 NOTE — Telephone Encounter (Signed)
Sara Lang with hospice notified as instructed by telephone. Sara Lang suggest Ativan 0.5mg  taking 1 tablet by mouth every 4-6 hours as needed. #45 with 2 refills. The Morphine concentrated 20 mg/47ml Start 0.65ml - 0.33ml every one hour as needed for pain and shortness of breath.#48ml bottle. Please write Hospice patient on prescription. And Atropine 1% opthalmic drops Use 2 drops by mouth every 30 minutes as needed for secretions and congestion. # 1 bottle. Sara Lang can take verbal order for Ativan and Atropine or can fax prescriptions for all 3 meds to hospice at fax # [phone].

## 2010-07-01 NOTE — Telephone Encounter (Signed)
Left v/m for Sara Lang to call back.

## 2010-07-01 NOTE — Telephone Encounter (Signed)
Please give verbal orders for the ones you can - as specified I will write the px for the others tomorrow when I'm back in the office-thanks Please route this back to me as a reminder-thanks

## 2010-07-01 NOTE — Telephone Encounter (Signed)
While I was out of office Sara Lang called back and Sara Lang gave verbal orders for Ativan and Atropine. Will fax Morphine rx after written by Dr Milinda Antis. Thank you.

## 2010-07-02 ENCOUNTER — Other Ambulatory Visit: Payer: Self-pay

## 2010-07-02 MED ORDER — MORPHINE SULFATE (CONCENTRATE) 20 MG/ML PO SOLN: ORAL | Status: DC

## 2010-07-02 MED ORDER — LORAZEPAM 0.5 MG PO TABS: ORAL_TABLET | ORAL | Status: DC

## 2010-07-02 MED ORDER — ATROPINE SULFATE 1 % OP SOLN: OPHTHALMIC | Status: DC

## 2010-07-02 NOTE — Telephone Encounter (Signed)
Here is morphine px to fax for hospice pt  Please double check her allergies with her son about this in light of ? Oxycodone all in past?  Also add other meds to med list that were verbally called in --thanks

## 2010-07-02 NOTE — Telephone Encounter (Signed)
Meds were called to [facility] hospice as instructed.

## 2010-07-02 NOTE — Telephone Encounter (Signed)
Spoke with pt's son and updated allergies for pt.Shalena at [facility] hospice notified as instructed by telephone and faxed morphine rx to [phone] .

## 2010-07-06 NOTE — Assessment & Plan Note (Signed)
Sara Lang is an 77 year old widowed female who is accompanied by her  son today.  She was last seen by me on June 09, 2008.  She has multiple  chronic pain complaints including cervicalgia, low back pain, bilateral  knee pain, peripheral neuropathy, and a history of gout.   Her son relates good deal of her history to me today.  He also mentions  that she may be worked up for mild Alzheimer symptoms by Dr. Milinda Antis.  She  recently had some thyroid function tests done.  Her blood sugars are  being monitored, as well as starting some B12 shots for low B12 and  vitamin D supplements.   Average pain today for her is about 9 on a scale of 10, interfering  significantly with activity level.  Sleep is fair.  Pain is worse with  most activities.  Improves with rest, heat, and medications.  She  reports fair relief with current meds.  Her pain is described as rather  constant, tingling, aching, stabbing, sharp, and burning in nature.   FUNCTIONAL STATUS:  She is able to walk 5 minutes at a time.  She has  difficulty with stairs, does not drive.  She is independent with self-  care including feeding, dressing, bathing and toileting, needs  assistance with higher level household activities.   Denies problems controlling bowel or bladder.  Denies depression,  anxiety, or suicidal ideation.   REVIEW OF SYSTEMS:  Remarkable for intermittent nausea, variations in  blood sugar, limb swelling, coughing, shortness of breath, and wheezing.  She does follow up with Dr. Milinda Antis for these problems.   No changes in past medical, social or family history other than that  already outlined.   MEDICATIONS:  From this clinic include;  1. Lidoderm on a p.r.n. basis.  2. Neurontin 100 mg q.i.d.   Questions regarding instability of gait and oversedation are addressed  to both, she and her son.  Both denied the Neurontin is causing any  oversedation or a gait instability.   Exam; blood pressure is 115/64, pulse  72, respirations 16, and 96%  saturate on room air.  She is a well-developed obese elderly female who  does not appear in any distress.  She is oriented x3.  Speech is clear.  Affect is bright.  She is alert, cooperative, and pleasant.  Follows  commands without difficulty.  Answers questions appropriately.   Her cranial nerves are notable for diminished hearing acuity.  Her  reflexes are diminished in upper and lower extremities without abnormal  tone.  No clonus.  No tremors are appreciated.  Sensation, denies any  problems with decreased sensation to light touch in the lower  extremities.   She has good strength in upper and lower extremities.  She does have  limitations in motion in her shoulders, cervical spine, and lumbar  spine.   No increase in pain complaints with internal-external rotation at the  hip.  She does have complaint of knee pain, however, even with light  palpation around the knee.  No effusion is appreciated.  No medial or  lateral gross instability is appreciated or AP instability is  appreciated.   Painful arc is noted with both shoulders, range of motion with abduction  is approximately to 120 degrees bilaterally.   Transitioning from sitting to standing is done slowly.  She has some  difficulty with tandem gait and does performed an adequate Romberg test.   IMPRESSION:  1. History of a cervical spondylosis/stenosis  with cervicalgia.  2. Lumbar spondylosis/stenosis with decreased ability to ambulate more      than 5 minutes.  3. History of gout.  4. History of knee osteoarthritis.  5. History of bilateral shoulder osteoarthritis worse on the right      than on the left.   PLAN:  We would recommend continuing Neurontin up to 3 times a day,  continue to monitor for dizziness or gait instability, continue to  recommend physical therapy to maintain a current level of functional  status.  She has been stable on the above medications.  We will see her  back  in 2 months.           ______________________________  Brantley Stage, M.D.     DMK/MedQ  D:  07/09/2008 08:22:14  T:  07/09/2008 22:17:47  Job #:  [id number]

## 2010-07-06 NOTE — Group Therapy Note (Signed)
Sara Lang is a very pleasant, widowed 77 year old who lives with  her son.  She has been referred by Dr. Milinda Antis for evaluation of multiple  pain complaints.   Sara Lang presents with a history of polyarticular arthritis, which  involves bilateral shoulders, wrists, DIPs, knees, and ankle and joints  of the feet.   She has difficulty telling me how many years she has had problems with  these areas.  She has not had any previous joint replacements.   Her chief complaint is her right knee and also her right ankle.   She states her average pain is about a 9 on a scale of 10.  She states  her pain interferes significantly with her activity.   She has complaints of right ankle pain and swelling, which she believes  has gone on for about 4-5 months; although she is not entirely sure.  She denies any recent trauma to the right ankle, however states she does  have a history of a previous fracture in this right ankle.   She is unable to give me much details regarding this.   Back, she reports this improves when she is sitting, is worse when she  is up standing and walking.  Sleep is fair.  She reports a little relief  with current meds which she is using for pain management, that is 5/325  hydrocodone up to 3 times per day.   With respect to mobility, she uses a walker or cane and typically in the  house she does use a walker.  She can walk from between 0 to 5 minutes,  not much more than that.  She is unable to climb stairs, and she has  never driven a car.   She is independent with her self-care including feeding, dressing,  bathing, and toileting, and she can do some meal prep as well, and she  also attempts to help out with light household duties, although she  states that she has to take multiple breaks throughout the day to get  anything done.   She states that overall she does not have a bladder control problem.  Her son who was present during our interview and physical  exam, states  that in about a years' time, she has had 2 episodes where she was unable  to make it to the bathroom in time.  Overall, she is continent, and she  had some problems with bowel control when she had a bout of ulcerative  colitis.   Regarding review of systems, she admits occasional night sweats and  variations in her blood sugar.  She is known to have diabetes mellitus.   Physicians currently involved in her care include Dr. Milinda Antis as well as  Dr. Jinny Sanders.   PAST MEDICAL HISTORY:  Significant for history of thyroid problems,  hypertension, and diabetes mellitus.  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   PAST SURGICAL HISTORY:  Positive for appendectomy in 1928, cataract  extraction, cholecystectomy, thyroidectomy, tonsillectomy, toe  amputation for hammer toes, toenail procedures, and EGD for stricture.   FAMILY HISTORY:  Positive for hypertension.   SOCIAL HISTORY:  The patient is widowed since 20.  She lives with her  son.  She does not smoke or drink alcohol.   MEDICATIONS:  Nexium, lisinopril, triamterene, hydrochlorothiazide,  Caduet, levothyroxine, calcium and vitamin D supplements, Humulin 70/30,  Norco 5/325 up to 3 times a day, and Levaquin 250 mg.   PHYSICAL EXAMINATION:  VITAL SIGNS:  Her blood pressure is 136/62, pulse  80, respiration 18, and 100% saturated on room air.  GENERAL:  She is an elderly female who appears her stated age.  She is a  well-developed and well-nourished.  She is oriented x3.  Her speech is clear.  Her affect is bright.  She is  alert, cooperative, and pleasant.  She follows commands without  difficulty.  She has intact cranial nerves grossly.  Coordination is grossly intact  as well.  Her reflexes are 2+ in the upper extremities and diminished in  the lower extremities.  Sensation is  intact to light touch, pinprick,  and vibratory sense in all of her extremities with the exception of  vibratory sense is diminished in the right lower extremity compared to  the left lower extremity.  Motor strength is generally in the 5/5 range without focal deficit.  MUSCULOSKELETAL:  Significant for apparent thoracolumbar scoliosis,  right shoulder is higher compared to the left shoulder.  Left hip is a  bit higher than the right hip.  She also has a mild kyphosis and  flattening of the lumbar lordosis.   She has limitations in the cervical range of motion significantly in all  planes.  She has limitations in shoulder range of motion bilaterally,  unable to abduct her shoulders past about 80 degrees.  She has  limitations in internal and external rotation of both shoulder joints.  She has limitations in full pronation on both right and left elbows.  She has deformity of bilateral wrists and multiple proximal  interphalangealjoints.  Thickening is noted as well.   Lower extremity reveals no significant pain with internal or external  rotation of either right or left hip.  Right knee is particularly tender  along the joint line and diffusely.  I am unable to do medial and  lateral stressing or AP stressing due to tenderness and testing of this  joint.  She is exquisitely tender around the right knee.  She has  significant swelling in the right calf.  Her right calf measures 37 cm,  18 cm from the distal patellar pole and on the left she has 33 cm  circumference from about 18 cm from the distal patellar pole, a 4 cm  difference between right and left leg.  She has tenderness in the calf  on the right and pitting edema in this right lower extremity.  She has  tenderness throughout the metatarsals, more so on the right than on the  left as well.   IMPRESSION:  This is an 77 year old, who presents with a polyarticular  arthritis.  She currently has swelling in the right lower extremity,  and  I would like to rule out deep vein thrombosis.  However, cellulitis, of  course, is in the differential diagnoses and also cannot exclude gout as  a diagnosis as well.   I have made a phone call over to Dr. Milinda Antis, and indicated that I would  be ordering Doppler ultrasound on her today.  She agrees with this.   I also recommend followup with rheumatologist for further evaluation,  possibly arthrocentesis.   PROBLEM LIST:  1. Right leg swelling.  2. Right ankle pain.  3. Right knee pain.  4. Lumbago.  5. Balance disorder which is probably multifactorial in nature.   PLAN:  1. As noted above, Doppler ultrasound right lower extremity.  2. Lumbar radiograph as well as cervical spine radiograph.  3. Right ankle radiograph.   Follow up with Rheumatology.   In the upcoming months, we would also consider physical therapy to  address functional deficits; however, we would like to address some of  these other issues first.  May at some point, consider repeating  Neurontin use; however, we would like to do  diagnostic tests prior to  placing her on any other medications at this time.  We will see her back  in the next 2 weeks.           ______________________________  Brantley Stage, M.D.     DMK/MedQ  D:  09/05/2007 13:47:47  T:  09/06/2007 08:13:48  Job #:  [id number]

## 2010-07-06 NOTE — Assessment & Plan Note (Signed)
Sara Lang is a very pleasant 77 year old widowed woman who is  accompanied by her son today at [facility].  She  is back in for recheck.  She has a history of active gout over the last  several months, history of cervical and lumbar spinal  stenosis/spondylosis, and a balance disorder which is most likely  multifactorial in nature.   She states her average pain is between an 8 and a 9 on a scale of 10,  localized to shoulders, upper extremities, bilateral lower extremities,  knees, and feet.  She does get some cramping in the arms as well as the  legs.  She was last seen on November 02, 2007.  Her pain interferes a  lot with her general activity.  Sleep tends to be poor.  Pain is worse  when she is active, improves with rest and lidocaine patches.  She gets  fair relief with them.   FUNCTIONAL STATUS:  She can walk a few minutes at a time.  She does not  drive or climb stairs.  She is independent with her self care.   REVIEW OF SYSTEMS:  Positive for diabetes which apparently she tells me  is in good control and lower extremity edema.   She continues to follow up with Dr. Kellie Simmering  for management of her gout.  She has been on colchicine and allopurinol and he has been managing  these 2 medications for her.  She reports overall significant  improvement in her pain since she has been on these medicines.   No other changes in past medical, social, or family history since her  last visit.   Medications prescribed through this clinic, none.   PHYSICAL EXAMINATION:  VITAL SIGNS:  Blood pressure 128/66, pulse 71,  respirations 18, and 97% saturation on room air.  GENERAL:  She is a well-developed, well-nourished, elderly female who  does not appear in any distress.  She is oriented x3.  Her speech is  clear.  Her affect is bright.  She is alert, cooperative, and pleasant.  She follow commands and answers questions appropriately.  Her cranial  nerves are  grossly intact as is her coordination.   Reflexes are overall diminished in the upper and lower extremities.  No  abnormal tone is noted.  No clonus is noted.  Sensation is intact to  light touch in the upper and lower extremities.   She has limitations in cervical range of motion as well as shoulder  range of motion bilaterally.  Lumbar motion is limited in all planes.  Manual muscle testing reveals some weakness around the hips.  She has  4/5 strength with hip flexors and she is unable to stand up without  pushing off with her upper extremities from a chair.  Distally, she has  a bit more strength.  She is in 4+ to 5-/5 strength at dorsiflexion,  plantar flexion, and knee extension as well as flexion.   Multiple areas of tenderness are noted throughout cervical paraspinal  musculature and over bilateral knees.  She has pitting edema in both  legs.   IMPRESSION:  1. Cervical spondylosis/stenosis.  2. Lumbar spondylosis/stenosis.  3. Gout.  4. Weakness especially hip flexion and extension.  5. Decreased right shoulder range of motion, worse than left.  6. Knee osteoarthritis.   PLAN:  We will trial her on Neurontin 100 mg 1 p.o. nightly.  We would  like to do a TENS unit trial with her as well.  We will trial her also  on some Lidoderm.  We explained to her how to use these patches 12 hours  on and 12 hours off and would like to get her set up for some physical  therapy after the holidays.  She seems to be interested in pursuing this  as well, to work on balance as well as lower extremity strengthening and  a TENS unit trial.  I will see her back in about 6 weeks.           ______________________________  Brantley Stage, M.D.     DMK/MedQ  D:  01/25/2008 11:21:28  T:  01/26/2008 01:20:52  Job #:  [id number]   cc:   [name], MD  [address] [place], [place] [address]

## 2010-07-06 NOTE — Assessment & Plan Note (Signed)
Sara Lang is an 77 year old widowed female, who lives with her son.  She was last seen by me last month.  She has multiple chronic pain  complaints including cervicalgia, low back pain, bilateral knee pain,  peripheral neuropathy, and history of gout.   She is also followed by Dr. Kellie Simmering, Rheumatology, and her primary care  physician is Dr. Roxy Manns.   She states her average pain is about 3, 8, and 9 on a scale of 10.  She  reports improvement overall in her left upper extremity pain.  She  continues to have some right shoulder pain.  This was injected by Dr.  Kellie Simmering within the last couple of weeks without relief.   She states her sleep is fair.  Pain is worse with activity and improves  with pain medication.  She is using 5/325 hydrocodone up to 3 tablets  per day per Dr. Roxy Manns.  From our clinic, she is using 100 mg of  Neurontin up to 4 times a day and reported some mild dizziness with it  on the 4 times a day regime.   She gets fairly good relief with current medications.   FUNCTIONAL STATUS:  She is able to walk 5 minutes at times.  She is able  to have difficulty with stairs.  She does not drive.  She is independent  with self-care.   Denies problems controlling bowel or bladder.  Denies depression,  anxiety, or suicidal ideation.  Admits to occasional confusion.   Review of systems otherwise noncontributory.  She has had limb swelling.  Today she does not have her compression hose on, however.   Past medical, social, family history otherwise unchanged from previous  visit.   PHYSICAL EXAMINATION:  VITAL SIGNS:  Today blood pressure is 119/59  sitting, repeat 120/74 sitting, standing 115/58, pulse 68, respiration  18, and 97% saturated on room air.  GENERAL:  She is well-developed, well-nourished elderly female, who does  not appear in any distress.  NEUROLOGIC:  She is oriented x3.  Speech is clear.  Affect is bright.  She is alert, cooperative, pleasant.   Follows commands without  difficulty.  Answers questions appropriately.  Cranial nerves and  coordination are intact.  Sensation is diminished in the lower  extremities below the knee.  MUSCULOSKELETAL:  Reflexes are diminished in the extremities below the  knee.  No tremors are appreciated.  No clonus is noted.  She has limited  motion around both shoulders.  Abduction is to approximately 100 degrees  bilaterally.  Internal and external rotation are limited, especially in  the right shoulder.  She has limitations in cervical range of motion as  well as lumbar range of motion.  She has full knee extension  bilaterally.  Does complain of increased pain in right knee with  flexion/extension with crepitus.  Internal/external rotation at hip does  not bother her.  She does have weakness also noted in hip flexors as  well as extensors.  Flexors are 3/5, hip extensors are also weak.  Difficult to formally test them, but she does have difficulty exiting a  chair without pushing off with her shoulders.   Medical problems include hypothyroid, on replacement; hypertension;  history of kidney disease; diabetes; and gout.   PAIN CLINIC IMPRESSION:  1. History of cervical spondylosis/stenosis with cervicalgia.  2. Lumbar stenosis/stenosis with decreased ability, ambulate less than      5 minutes.  3. History of gout.  4. History of knee osteoarthritis.  5. Bilateral shoulder osteoarthritis, some improvement in the left      shoulder.  However, right shoulder continues to be painful status      post injection approximately 1 month ago Dr. Kellie Simmering.   PLAN:  We will decrease her Neurontin to 3 times per day secondary to  dizziness.  We will also get her set up for physical therapy to have  them address lower extremity strength deficits especially in hip flexion  and extension, to assist in helping her exit chairs, getting on and off  toilet, improve gait stability, improve gait balance.  A very  low  intensity exercise will be prescribed 2-3 times per week for the next 4  weeks.  Cardiac precautions and cautioned to knee OA as well.  We will  see her back in a month.  They are in agreement with this plan.           ______________________________  Sara Lang, M.D.     DMK/MedQ  D:  06/09/2008 09:03:37  T:  06/10/2008 00:13:41  Job #:  [id number]   cc:   [name], MD  [address] [place], [place] [address]   Aundra Dubin, M.D.  [address]  [place]  [place] [address]

## 2010-07-06 NOTE — Assessment & Plan Note (Signed)
Ms. Albert is a very pleasant 77 year old widowed woman who is  accompanied by her son today to visit at [facility].  She is being followed in our clinic for pain complaints related  to her neck, low back, upper extremities, bilateral knees, ankles, and  lower legs.   She was diagnosed with gout approximately 6-8 weeks ago and is currently  followed now by Dr. Kellie Simmering who has placed her initially on allopurinol  and has recently added colchicine.  She has also been on prednisone as  well.  She recently saw him and he injected her right knee.   She also is known to have cervical as well as lumbar spondylosis with  symptoms of lumbar spinal stenosis.   She states that she has had some dizziness recently but otherwise has  not had any medical problems and she has not followed up recently with  her primary care Dr. Milinda Antis.   Regarding her pain, she states her pain is about a 7 on a scale of 10,  worse in the mornings than throughout the day.  Pain is typically  worsened with walking and standing, improves with injections.   She reports fair relief with current medications she uses for pain.   She is currently not on any medications through this clinic.  Dr. Milinda Antis  has been prescribing hydrocodone for her and she has found a good amount  of relief with the allopurinol and prednisone.   FUNCTIONAL STATUS:  MOBILITY:  She is able to walk 5-10 minutes at a  time.  She is able to climb stairs.  She does not drive.  She uses a  cane as well as the walker.  She is independent with self care,  cleaning, feeding, dressing, bathing, toileting, meal prep as well as  household duties.   Denies problems controlling bowel or bladder.  Admits to occasional  numbness and tingling in the extremities.  Denies depression, anxiety or  suicidal ideations.   Reports an overall improvement in the edema in the bilateral lower  extremities.   No other changes in past medical,  social or family history since her  last visit.   PHYSICAL EXAMINATION:  VITALS SIGNS:  Blood pressure is 132/55, pulse  69, respirations 18, and 98% saturated on room air.  GENERAL:  An obese elderly female who appears her state age.  She is  oriented x3.  Her speech is clear.  Her affect is bright.  She is  alert, cooperative, and pleasant.  She follows commands easily.  CNS:  Cranial nerves are grossly intact.  Coordination is grossly intact  as well.  Reflexes are diminished in upper and lower extremities.  Sensation is diminished below the knees.  Her motor strength in the  lower extremities is in the 4/5 range.  She complains of pain with  movement of each joint during manual muscle testing.  Upper extremities  are in the 4+/5 range.  Limitations in shoulder range of motions are  noted.  Limitations in cervical range of motions as well as lumbar  motion all significantly limited.  She has a hyperlordotic cervical  spine as well as a kyphosis of the upper thoracic spine.  EXTREMITIES:  Bilateral lower extremities are overall improved with  respect to erythema and edema.   IMPRESSION:  1. Gout.  2. Cervical lumbar spinal stenosis/spondylosis.  3. Balance disorder which is most likely multifactorial in nature.   PLAN:  I recommend Balance Program.  Ms Blumenstock would like to hold off  until the next visit to engage in this.  I have again reviewed the MRI  with her.  She is not interested at this time in any other medications  such as Neurontin.  She is not interested in any kind of spinal  injections.  I have encouraged her to continue walking each day multiple  times a day to maintain her strength and endurance using an assistive  device.  I have asked her to follow up with Dr. Milinda Antis if she continues  to have persistent dizziness.  I will see her back in 3 months.           ______________________________  Brantley Stage, M.D.     DMK/MedQ  D:  11/02/2007 13:54:16  T:   11/03/2007 03:55:53  Job #:  [id number]   cc:   [name], MD  [address] [place], [place] [address]

## 2010-07-06 NOTE — Assessment & Plan Note (Signed)
[name] is an 77 year old widowed woman who is accompanied by  her son to our [facility].  She is back in today for  a recheck.  She has a history of gout as well as cervical lumbar  stenosis/spondylosis and balance disorder which is most likely  multifactorial in nature.  She also is complaining of some right  shoulder pain.   She is a patient of Dr. Milinda Antis.  Ms. Lang states that she has not had  any medical problems since I saw her last on January 25, 2008.  No  changes in any medications.   She was started on some Neurontin 100 mg at night.  Both she and her son  states that she has been sleeping better since she started this at  night.  He notes that she is not getting up as much anymore and she  feels she is resting better.  She states that there have been no  untoward side effects from this medication for her.  She feels it has  been helpful.   Average pain is about an 8 on a scale of 10.  Pain is localized to  multiple areas including bilateral shoulders, low back, bilateral knees,  legs, and ankles.   Her pain is typically worse with activities, improves with rest,  medication, and lidocaine patches.   She gets fair relief with current meds.   FUNCTIONAL STATUS:  She reports she can walk 5 minutes at a time.  She  is unable to climb stairs nor drive.  She does use an assistive device  for walking, a straight cane, and occasionally a walker.   She is also independent with feeding, dressing, bathing, and toileting  as well as light meal prep.  She gets assistance with shopping and  heavier household duties.   Review of systems is negative for bowel or bladder control problems.  Denies suicidal ideation.  Admits to occasional numbness and tingling in  her extremities and trouble walking.  She does have diabetes and has  variations in blood sugar levels as well.   No other changes in past medical, social, or family history since her  last  visit.   Medications provided through this clinic include:  1. Neurontin 100 mg one p.o. nightly.  2. Lidoderm patch p.r.n.   On exam today, her blood pressure is 134/65, pulse 72, respiration 18,  98% saturated on room air.  She is a well-developed elderly female who  does not appear in any distress.  She is oriented x3.  Her speech is  clear.  Her affect is bright.  She is alert, cooperative, and pleasant.  She answers all questions appropriately.  Her cranial nerves are notable  for decreased ability to hear.  Coordination is grossly intact.  Her  reflexes are diminished in both upper and lower extremities.  No  abnormal tone is noted.  No clonus is noted.  Sensation is intact with  light touch in both upper and lower extremities as well.  No obvious  deficits in any dermatomal region.   Motor strength is in the generally 5/5 range without focal deficits.  I  do need to coax her to give me full effort specially examining some of  her more painful joints in the right shoulder and the right knee.   Her right shoulder has some limitations in range of motion.  Her right  external rotation is about 80, internal rotation is approximately 50  degrees, and  she is able to abduct to 90 degrees, left shoulder external  rotation 90, internal rotation 80, abduction 90 degrees as well.   She has diminished range of motion in her cervical spine in all planes.  Lumbar motion is also limited.  She has tenderness over both knee  joints, appears to have a slight effusion on the right as well.   She is able to transition from sitting to standing by pushing up with  both upper extremities.  Once standing, her stride length is short and  antalgic, decreased weightbearing especially in the right lower  extremity.  Tandem gait was not assessed due to right knee pain today.  Romberg test was performed adequately.  She did complain of some  dizziness upon initial standing.   IMPRESSION:  1. Cervical  spondylosis/stenosis with cervicalgia.  2. Lumbar spondylosis/stenosis with decreased ability to ambulate more      than 5 minutes.  3. History of gout, currently treated by Dr. Kellie Simmering, decrease her      right shoulder range of motion.  4. Knee osteoarthritis with knee pain.   PLAN:  We will increase her Neurontin slightly 100 mg 1 p.o. nightly and  once p.o. q. 1 p.m.  We will have her setup for a TENS unit trial with  Physical Therapy Department.  We would also like Therapy Department to  assess her gait and suggest appropriate assistive device for her.  She  is expressing interest in a quad cane; however, I am not sure that this  is the best option for her.   I have discussed with her my intent to get her into physical therapy in  the next month or two after she is tolerating the Neurontin.  We would  like Therapy to work on lower extremity strength and hip extensor  strength to decrease the amount of stress she puts on her shoulders when  she stands up by strengthening her hip extensors.  I may consider right  shoulder radiographs at some point, may consider right shoulder  injection if this continues to be a problem.  I have believe once she  has stronger lower extremity strength and is not putting so much stress  through her upper extremities, she may have overall improvement in her  shoulder pain.  I will see her back in a month.           ______________________________  Brantley Stage, M.D.     DMK/MedQ  D:  02/29/2008 09:19:24  T:  02/29/2008 21:47:59  Job #:  [id number]   cc:   Sara A. Tower, MD  [address] [place], [place] [address]   Aundra Dubin, MD

## 2010-07-06 NOTE — Assessment & Plan Note (Signed)
Sara Lang is a pleasant 77 year old woman who is with her son, who is  also accompanying her today at her visit.   She presented on September 05, 2007, with a polyarticular arthritis involving  shoulders, wrists, DIPs, knees, ankles, and joints of the feet.   Also, had complaints of neck and low back pain.   Radiographs of her neck, her lumbar spine, as well as, her feet were  done on September 05, 2007, the results are attached to the chart.  In the  interim, she also had a Doppler ultrasound of the right lower extremity  for significant right lower extremity edema, which was negative for DVT.  The results of all this were reviewed with her today.  She has an  initial visit appointment with Dr. Kellie Simmering, which is scheduled for next  month.   Her average pain is about a 9 on a scale of 10.  She is able to walk  about 5 minutes at a time.  She is independent with her feeding,  dressing, bathing, toileting, self care, and meal prep.  She can do some  light housekeeping tasks as well.  Denies problems with bowel or  bladder.  Admits there is trouble walking due to pain in her legs and  her back.   Since last visit, there are no other changes regarding her past medical,  social, or family history within the last 2 weeks.   PHYSICAL EXAMINATION:  VITAL SIGNS:  Her blood pressure is 124/47, pulse  76, respirations 18, and 98% saturated on room air.  GENERAL:  She is a well-developed and well-nourished elderly female who  appears her stated age.  She is oriented x3.  Her speech is clear.  Her  affect is bright.  She is alert, cooperative, and pleasant.  She transitions from sitting to standing slowly.  She has an antalgic  gait.  Coordination is grossly intact.  She has some difficulty with  balance and does use a cane.  EXTREMITIES:  Reflexes are diminished in the lower extremity.  She has  intact sensation to light touch and 5/5 strength in lower extremities.  She still has some right ankle  swelling and foot swelling.   PROBLEM LIST:  1. Right leg swelling.  2. Right ankle pain.  3. Right knee pain.  4. Lumbago.  5. Balance disorder, which is probably multifactorial in nature.   PLAN:  We will obtain lumbar MRI.  Follow up with Rheumatology as  planned.  Results of the radiographs were reviewed with her and attached  to the chart.  Briefly, cervical spine shows disk space narrowing and  spurs at C5-6 and C6-7, mild bilateral neural foraminal narrowing is  noted.  No instability is appreciated on the cervical radiographs.  Lumbar radiographs show multilevel lumbar spondylosis, which is  particularly severe at L3-4 with vacuum disc phenomenon noted, also she  has a mild lumbar curvature convex to the right and is around the L2-3  lumbar vertebrae and ankle x-ray showed mid-foot arthritis without  evidence of any acute findings.  No evidence of fractures or  dislocations were noted.  Osteopenic bones were appreciated, however.   I will see her back next month after further evaluations are completed  on Sara Lang.           ______________________________  Brantley Stage, M.D.     DMK/MedQ  D:  09/17/2007 13:38:00  T:  09/18/2007 05:49:44  Job #:  [id number]   cc:  [name], MD  [address] [place], [place] [address]

## 2010-07-06 NOTE — Assessment & Plan Note (Signed)
Sara Lang is an 77 year old widowed woman, who is accompanied by her  son to our [facility].  She is back in today for  brief recheck and refill of her medications today.   She is being followed in our [facility] for multiple  pain complaints.  She has a history of gout as well as cervical and  lumbar stenosis, and has osteoarthritis in multiple joints, balance  disorder as well.   She is patient of Dr. Milinda Antis.  Sara Lang does not report any new  medical problems.  She has seen Dr. Kellie Simmering, her rheumatologist, and he  has her on colchicine at this time.   At the last visit, I had written an order for her to go to the physical  therapy department to undergo a TENS unit trial and for an assessment of  proper assistive device which since she has balance problems and  osteoarthritis of her knees.  However, she was under the impression she  would have to do a physical therapy program, which would include loss of  exercise and she refused to go when her son suggested he take her for  the appointment.   Her average pain is about an 8 on a scale of 10.  Sleep is poor.  Pain  is worse with activities, improves with the medication.  She gets fair  relief with current meds.   She has trialed the Neurontin.  She has been taking it twice a day and  reports no problems with oversedation or increased balance problems.  Her son believes she has been up a little bit more with the use of this  medication.  She has had no falls.   FUNCTIONAL STATUS:  As follows:  She is able to walk about 5 minutes at  a time.  She does not climb stairs nor does she drive.  She is  independent with her self-care, needs assistance with higher level  household tasks.  She denies problems with bowel or bladder and admits  to occasional confusion and denies suicidal ideation.   REVIEW OF SYSTEMS:  Otherwise noncontributory.   No changes in past medical, social or family  history since last visit.   PHYSICAL EXAMINATION:  VITAL SIGNS:  Blood pressure is 119/68, pulse 65,  respiration 18, and 97% saturated on room air.  GENERAL:  She is mildly obese elderly female who appears her stated age  and does not appear in any distress.  She is oriented x3.  Her speech is  clear.  Her affect is bright.  She is alert, cooperative, and pleasant.  She follows commands without difficulty.  She answers questions  appropriately.  EXTREMITIES:  Cranial nerves coordination are grossly intact.  Reflexes  are diminished in the lower extremity.  There is no abnormal tone.  No  clonus.  No tremors are appreciated.  Sensory exam is intact to light  touch in the lower extremities.  Motor strength is 4+/5 at the hip  flexors and 5/5 distally.  She needs to push off with her upper  extremities, get out of the chair indicating some hip extensor weakness  as well.  Tandem gait is not assessed due to knee pain and Romberg test  is performed adequately.  She does have a little bit of dizziness  initially upon standing, but this seems to improve when she is up.   IMPRESSION:  1. Cervical spondylosis/stenosis with cervicalgia.  2. Lumbar spondylosis/stenosis with decreased stability and  she      ambulates more than 5 minutes.  3. History of gout.  4. Knee osteoarthritis.  5. Bilateral shoulder osteoarthritis with limited motion bilaterally.   PLAN:  We will increase her Neurontin slightly to 100 mg 3 times a day  for 1 week and then q.i.d.  She will discontinue it if she finds that it  makes her dizzy or worse since her balance.  Her son will be observing  her as well  since he lives with her.  She has agreed to follow up again in the  physical therapy department to try a TENS unit and be assessed for  appropriate assistive device to prevent falls.  I will see her back in 1  month.           ______________________________  Brantley Stage, M.D.     DMK/MedQ  D:   04/25/2008 14:33:59  T:  04/26/2008 06:47:37  Job #:  [id number]   cc:   [name], MD  [address] [place], [place] [address]   Aundra Dubin, M.D.  [address]  [place]  [place] [address]

## 2010-07-09 NOTE — Op Note (Signed)
   NAME:  Sara Lang, Sara Lang                          ACCOUNT NO.:  [id number]   MEDICAL RECORD NO.:  [id number]                   PATIENT TYPE:  INP   LOCATION:  0349                                 FACILITY[facility]  [facility]   PHYSICIAN:  James L. Malon Kindle., M.D.          DATE OF BIRTH:  Sep 14, 1920   DATE OF PROCEDURE:  11/27/2002  DATE OF DISCHARGE:                                 OPERATIVE REPORT   PROCEDURE:  Sigmoidoscopy and biopsy.   MEDICATIONS:  1. Fentanyl 25 mcg.  2. Versed 3 mg IV.   SCOPE:  Olympus pediatric colonoscope.   INDICATION:  The patient presented with acute abdominal pain and bloody  diarrhea.  CT scan suggested a colitis of the sigmoid and descending colon.  This is done to evaluate.   DESCRIPTION OF PROCEDURE:  The procedure had been explained to the patient  and consent obtained.  With the patient in the left lateral decubitus  position, the Olympus scope was inserted and advanced.  There was no  diverticulosis seen.  We were able to advance to 30-32 cm at which point  there was sudden onset and a clear demarcation of severe ulceration and  edema.  I went past this area just a little bit, and it appeared like this  as far as I could see.  There were no diverticula seen.  It had the  approximately of segmental colitis.  Several biopsies were taken, and the  scope was withdrawn.  Again, no diverticula were seen, and the mucosa from  about 30 cm to the anal verge was normal.  The patient tolerated the  procedure well.   ASSESSMENT:  Segmental colitis, probably ischemic.   PLAN:  We will continue supportive therapy with IV fluids, antibiotics, and  NPO.  Advance diet slowly.                                               James L. Malon Kindle., M.D.    Waldron Session  D:  11/27/2002  T:  11/27/2002  Job:  [id number]   cc:   Sharlet Salina, M.D.  [address]  [place]  [place] [address]  Fax: [phone]   Deirdre Peer. Polite, M.D.  [address]  [place], [place] [address]  Fax: [phone]

## 2010-07-09 NOTE — Procedures (Signed)
[place]  Patient:    Sara Lang, Sara Lang                       MRN: [id number] Proc. Date: 11/01/99 Adm. Date:  [id number] Attending:  Sabino Gasser                           Procedure Report  PROCEDURE:  Upper endoscopy with Savary dilation.  INDICATIONS:  Dysphagia.  ANESTHESIA:  Demerol 25 mg, Versed 5 mg were given intravenously in divided dose and preoperative antibiotics.  PROCEDURE:  With the patient mildly sedated in the left lateral decubitus position in Room 10 of radiology, the Olympus videoscopic endoscope was inserted in the mouth and passed under direct vision through the esophagus. Distal esophagus was approached and there appeared to be some changes of distal esophagitis, photographed.  We entered into the stomach.  Fundus, body, antrum, duodenal bulb and second portion of the duodenum were all well-visualized and appeared normal.  From this point, the endoscope was slowly withdrawn, taking circumferential views of the entire duodenal mucosa until the endoscope had been pulled back into the stomach and placed on retroflexion to view the stomach from below and a hiatal hernia was seen and photographed.  The endoscope was straightened and a guidewire was passed under fluoroscopic control.  Savary dilators 14, 16 and 18 were passed easily without resistance.  With the latter, the guidewire was removed.  The endoscope reinserted to the antrum and fundus.  From this point, the endoscope was slowly withdrawn taking circumferential views of the remaining gastric and esophageal mucosa, which otherwise appeared normal.  Patients vital signs and pulse oximeter remained stable. The patient tolerated the procedure well without apparent complications.  FINDINGS:  Hiatal hernia, otherwise unremarkable endoscopic examination other than some esophagitis distally.  PLAN:  Await clinical response to see how patient responds to the dilation. Clear  liquid diet for today, then resume regular diet starting tomorrow. Follow-up will be with me in a few weeks or as needed. DD:  11/01/99 TD:  11/01/99 Job: [id number]

## 2010-07-09 NOTE — Discharge Summary (Signed)
NAME:  RHIANN, BOUCHER                          ACCOUNT NO.:  [id number]   MEDICAL RECORD NO.:  [id number]                   PATIENT TYPE:  INP   LOCATION:  0349                                 FACILITY:  [facility]   PHYSICIAN:  Jackie Plum, M.D.             DATE OF BIRTH:  05-27-1920   DATE OF ADMISSION:  11/25/2002  DATE OF DISCHARGE:  12/03/2002                                 DISCHARGE SUMMARY   DIAGNOSES:  1. Ischemic colitis, resolved.     a. CT of abdomen and pelvis done on November 25, 2002 notable for diffuse        wall thickening associated with the left colon, moderate sized hiatal        hernia, tiny right lower lobe and left lower lobe parenchymal        pulmonary nodules (outpatient follow-up scans in about two to three        months recommended).     b. Sigmoidoscopy and biopsy done by Fayrene Fearing L. Randa Evens, M.D. on November 27, 2002 notable for segmental colitis.  Biopsy of colon notable for        benign colonic mucosal ulceration and erosion associated with acute        inflammation and surface exudation.  Appearance consistent with        possible ischemia.  Pseudomembranous colitis noted to be a        differential.  2. Tiny 3-4 mm right lower lobe and left lower lobe parenchymal pulmonary     nodules found incidentally as mentioned above.  Outpatient follow-up with     repeat scans in two to three months per primary care physician.  3. History of hypertension.  4. History of diabetes.  5. History of chronic renal insufficiency.  6. History of gastroesophageal reflux disease.  7. History of dyslipidemia.  8. History of chronic low back pain.   DISCHARGE MEDICATIONS:  The patient is going home to resume her preadmission  medications as previously.  This includes:  1. Nexium 40 b.i.d.  2. Norvasc 10 daily.  3. Triamterene/hydrochlorothiazide 37.5/25 one tablet daily.  4. Darvocet-N 100 one tablet q.i.d. p.r.n.  5. Humulin 70/30 15 units b.i.d.  6. The  patient has been prescribed three new medications which include     ciprofloxacin 500 mg p.o. daily adjusted for her renal function for four     days only.  7. Flagyl 250 mg p.o. q.i.d. for four days only.  8. MiraLax 17 g daily for constipation.   ACTIVITY:  As tolerated.   DIET:  Low salt 2000 calorie ADA diet.   SPECIAL INSTRUCTIONS:  The patient is to report to M.D. if she experiences  any problems including diarrhea, fever, or chills.  She should drink lots of  fluid and avoid constipation.   FOLLOWUP:  Follow-up appointment will be with her primary care  physician,  Sharlet Salina, M.D. on Monday the 18th of October 2004 at 1:15 p.m.   CONSULTS:  Llana Aliment. Randa Evens, M.D. of GI.   PROCEDURE:  Sigmoidoscopy as mentioned above.   CONDITION ON DISCHARGE:  Improved and satisfactory.   DISCHARGE LABORATORIES:  WBC count 8.9, hemoglobin 12.7, MCV 24.9, platelet  count 249,000.  Sodium 144, potassium 4.3, chloride 109, CO2 27, glucose  144, BUN 20, creatinine 1.7, calcium 7.9.   REASON FOR HOSPITALIZATION:  Acute abdominal pain, diarrhea, and bleeding  per rectum.   HISTORY OF PRESENT ILLNESS:  The patient is an 77 year old Caucasian lady  with history of diabetes and hypertension and chronic renal insufficiency  who presented with severe generalized crampy abdominal pain mostly in the  lower quadrants.  She does not have any history of fever or chills but,  however, had had several episodes of diarrhea.  Diarrheal stools were  bloody.  The patient saw her primary care physician at which point rectal  examination showed gross bright red blood on the examining finger.  She was  therefore rushed to the emergency room where upon she was started with IV  fluids and subsequently CT scan was obtained which showed diffuse chronic  thickening and admitted to the hospitalists service.   Please see the admission H&P dictated by Hollice Espy, M.D. of [facility]  hospitalists dictated  November 25, 2002, for further details regarding her  presenting symptomatology and signs and further evolution and laboratory  work.   HOSPITAL COURSE:  The patient was admitted to the [facility] hospitalists service  initially on telemetry bed without any significant dysrhythmia and  subsequently telemetry monitoring was discontinued.  She received supportive  care including IV fluid resuscitation.  Antihypertensive medications were  initially held and subsequently readded to her medication regimen.  Patient  was hypokalemic secondary to her GI loss and received repletion of her  potassium.  She was started on IV antibiotics in view of her leukocytosis  for possible infectious etiology.  Her hemoglobin was judiciously monitored.  She received IV proton pump inhibitors.   The patient was seen in consultation by Fayrene Fearing L. Edwards, M.D. of GI who  thought that patient's symptoms were likely secondary to ischemic colitis  and agreed with IV antibiotics, bowel rest, and IV fluids.  The patient  subsequently had a sigmoidoscopy as noted above and results as noted above.   The patient's symptoms have resolved.  She is feeling better today.  She  does not have any clinical dehydration.  She is able to tolerate her food  diet overnight and she is not nauseous or vomiting and therefore ready for  discharge today.  I called both of patient's sons, Al and [name], by telephone.  Discussed patient's work-up so far and impressions  and need to follow up with them.  Also discussed new medication issues with  them as well as the patient and they were all expressing understanding.  The  patient discharged home in stable satisfactory condition.  Her sons will  come and take her home some time this morning.                                               Jackie Plum, M.D.    GO/MEDQ  D:  12/03/2002  T:  12/03/2002  Job:  [id number]   cc:  James L. Malon Kindle., M.D. [address]   [place]  [place] [address]  Fax: [phone]   Sharlet Salina, M.D.  [address]  [place]  [place] [address]  Fax: [phone]

## 2010-07-09 NOTE — Op Note (Signed)
Sara Lang, Sara Lang                ACCOUNT NO.:  [id number]   MEDICAL RECORD NO.:  [id number]          PATIENT TYPE:  INP   LOCATION:  5702                         FACILITY:  [facility]   PHYSICIAN:  Gita Kudo, M.D. DATE OF BIRTH:  1920/08/22   DATE OF PROCEDURE:  10/15/2004  DATE OF DISCHARGE:  10/16/2004                                 OPERATIVE REPORT   OPERATIVE PROCEDURE:  Right thyroid lobectomy.   SURGEON:  Edwena Bunde   ASSISTANT:  Velora Heckler, MD   ANESTHESIA:  General endotracheal.   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   POSTOP DIAGNOSIS:  Large right thyroid nodule with substernal enlargement.   CLINICAL SUMMARY:  77 year old female who has been in good general health.  She was having evaluation for orthopedic surgery and a chest x-ray showed  possible nodules in her lung.  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 On careful questioning, she does have a little  bit of shortness of breath. There is no definite impingement on her trachea.  She does have some difficulty swallowing.   OPERATIVE FINDINGS:  The patient had a previous left thyroid lobectomy many  years ago for a cold nodule. There was no swelling on the left side and I  did not reopen that area. On the left side there was a large nodule  extending behind the clavicle down into the superior mediastinum. Two  parathyroid glands identified as well as the recurrent nerve and they were  not injured.   OPERATIVE PROCEDURE:  Under satisfactory general endotracheal anesthesia,  the patient received IV Ancef and was prepped, draped and positioned in  standard fashion. Her previous incision was reopened carefully down into the  subcu and platysma layer. Then superior and inferior flaps developed and  self-retaining retractor placed. The midline was  opened and left side not  examined or dissected. The strap muscles on the right were dissected away  and retracted and then gentle finger dissection was used to delineate the  large gland and help elevate it up from the mediastinum. The artery -  inferior thyroid artery was identified and controlled with multiple clips.  Then the gland was rotated medially and taken off the trachea. The superior  pole vessels were taken down between ties of silk and metal clips. The  dissection was carried over medially to where the previous left lobectomy  had been done. The gland was sent for pathology.  The wound was then lavaged with saline, made dry by cautery or metal clip.  Inspected and was hemostatic. Surgicel  placed in the bed of the thyroid gland.  When we were sure of hemostasis and  the wound closed in layers with 3-0 and 4-0 Vicryl and Steri-Strips for  skin. Sterile dressings applied. The patient went to the recovery room from  the operating room in good condition without complication.           ______________________________  Suszanne Conners.  Maryagnes Amos, M.D.     MRL/MEDQ  D:  10/15/2004  T:  10/17/2004  Job:  [id number]   cc:   Sharlet Salina, M.D.  [address]  [place]  [place] [address]  Fax: [phone]

## 2010-07-09 NOTE — Op Note (Signed)
Sara Lang, DIETZMAN                ACCOUNT NO.:  [id number]   MEDICAL RECORD NO.:  [id number]          PATIENT TYPE:  AMB   LOCATION:  DSC                          FACILITY:  [facility]   PHYSICIAN:  Leonides Grills, M.D.     DATE OF BIRTH:  1920-12-04   DATE OF PROCEDURE:  02/01/2005  DATE OF DISCHARGE:                                 OPERATIVE REPORT   PREOPERATIVE DIAGNOSIS:  Bilateral second hammer toes.   POSTOPERATIVE DIAGNOSIS:  Bilateral second hammer toes.   OPERATION:  Bilateral second toe amputations through metatarsophalangeal  joints.   ANESTHESIA:  General.   SURGEON:  Leonides Grills, M.D.   ASSISTANT:  Lianne Cure, P.A.-C.   ESTIMATED BLOOD LOSS:  Minimal.   TOURNIQUET TIME:  None.   COMPLICATIONS:  None.   DISPOSITION:  Stable to the PR.   INDICATIONS FOR PROCEDURE:  This is an 77 year old female who has had  symptomatic bilateral second hammer toes that have been interfering with her  life to the point she could not do what she wants to do.  She was consented  for the above procedure.  All risks which include infection, neurovascular  injury, symptomatic to the third toe, were all explained, questions were  encouraged and answered.   OPERATION:  The patient was brought to the operating room and placed in  supine position.  After adequate general endotracheal anesthesia was  administered as well as Ancef 1 gram IV piggyback, the bilateral lower  extremities were prepped and draped in a sterile manner.  No tourniquet was  used.  A racquet shaped incision was made over the left second toe, based  dorsally.  Dissection was carried down to bone.  The base of the proximal  phalanx was dissected on bone to the MTP joint and a capsulotomy was made  and the toe was removed.  Hemostasis was  obtained.  The area was copiously irrigated with normal saline.  The subcu  was closed with 3-0 Vicryl, the skin was closed with 4-0 nylon.  The same  exact procedure was  performed on the right second toe, as well.  Sterile  dressings were applied.  A hard sole shoe was applied.  The patient was  stable to the PR.      Leonides Grills, M.D.  Electronically Signed     PB/MEDQ  D:  02/01/2005  T:  02/02/2005  Job:  [id number]

## 2010-07-09 NOTE — Consult Note (Signed)
NAME:  Sara Lang, Sara Lang                          ACCOUNT NO.:  [id number]   MEDICAL RECORD NO.:  [id number]                   PATIENT TYPE:  INP   LOCATION:  0349                                 FACILITY:  [facility]   PHYSICIAN:  James L. Malon Kindle., M.D.          DATE OF BIRTH:  [birth year]   DATE OF CONSULTATION:  DATE OF DISCHARGE:                                   CONSULTATION   REASON FOR CONSULTATION:  Abnormal CT and abdominal pain and diarrhea.   HISTORY:  An 77 year old white female patient of Sharlet Salina, M.D. who  had been doing quite well until two days ago when she developed sudden onset  of cramping abdominal pain.  Initially could not have a bowel movement.  Felt that she could not pass her stool and then had a fairly regular bowel  movement and then passed diarrhea that became bloody.  She has continued to  have abdominal pain.  Came in to see her primary physician's office with  that history.  She had bright blood on examination.  Was brought to the  emergency room.  A CT of the abdomen/pelvis showed marked thickening of the  descending colon consistent with ischemic colitis.  The patient is on  antibiotics, is feeling somewhat better.  She initially had a temperature of  100.1.  It has come down with fluids and antibiotics.  We were asked to see  her for further evaluation of these things.   MEDICATIONS ON ADMISSION:  1. Nexium 40 mg b.i.d.  2. Norvasc 10 mg daily.  3. Lipitor 10 daily.  4. Dyazide 37.5/25 daily.  5. Darvocet p.r.n. q.i.d. for arthritis.  6. Insulin Humulin 70/30 15 units b.i.d.   ALLERGIES:  SULFA, PENICILLIN, intolerance of NONSTEROIDAL DRUGS.   PAST MEDICAL HISTORY:  1. History of esophageal stricture dilated many years ago by Dr. Elesa Massed.  Has     chronic esophageal reflux.  2. History of hypertension.  3. Diabetes mellitus on insulin.  4. Chronic renal insufficiency exacerbated by nonsteroidal drugs and     diabetes.  5. No previous  abdominal surgery.  She does have severe arthritic problems     in her hips and legs and apparently needs replacements.   FAMILY HISTORY:  Noncontributory.  She does not have any family history of  colon cancer.   SOCIAL HISTORY:  She does not smoke or drink.  Is quite functional at home,  although she does have some leg pains.   REVIEW OF SYSTEMS:  She had a colonoscopy four or five years ago probably by  someone in Dr. Dorthula Nettles office.  She is a bit unclear on this, but reports  this was normal.   PHYSICAL EXAMINATION:  VITAL SIGNS:  Temperature 98.7, pulse 80, blood  pressure 147/82.  GENERAL:  Pleasant white female in no acute distress.  HEENT:  Eyes:  Sclerae nonicteric.  NECK:  Supple.  No lymphadenopathy.  LUNGS:  Clear.  HEART:  Regular rate and rhythm without murmurs or gallops.  ABDOMEN:  Nondistended and generally soft with mild lower abdominal  tenderness.   ASSESSMENT:  Acute abdominal pain, diarrhea, and bleeding probably due to  ischemic colitis as suggested on CT scan.   PLAN:  1. I agree with continuing the Cipro, bowel rest, IV fluids.  2. Will proceed with a tap water enema and a sigmoidoscopy tomorrow.  I have     discussed this with the patient and she is agreeable.                                               James L. Malon Kindle., M.D.    Waldron Session  D:  11/26/2002  T:  11/26/2002  Job:  [id number]   cc:   Sharlet Salina, M.D.  [address]  [place]  [place] [address]  Fax: [phone]

## 2010-07-09 NOTE — H&P (Signed)
NAME:  Sara Lang, Sara Lang                          ACCOUNT NO.:  [id number]   MEDICAL RECORD NO.:  [id number]                   PATIENT TYPE:  INP   LOCATION:  0105                                 FACILITY:  [facility]   PHYSICIAN:  Hollice Espy, M.D.            DATE OF BIRTH:  [birth year]   DATE OF ADMISSION:  11/25/2002  DATE OF DISCHARGE:                                HISTORY & PHYSICAL   PRIMARY CARE PHYSICIAN:  Sharlet Salina, M.D.   HISTORY OF PRESENT ILLNESS:  This is an 77 year old white female with past  medical history of diabetes, type 2, and hypertension, and chronic renal  insufficiency, who presents with abdominal pain and GI bleed.  The patient  states that she had been previously well when all of a sudden one day ago  she started having severe crampy abdominal pain, which she felt was all  over, but mostly in the lower quadrant.  She had to sit down secondary to  the pain.  She denied any fevers or chills and never had any pain like this  before.  In addition she at some point felt the need to void and had several  bowel movements, no diarrhea.  However, this helped relieve some of the  pain, but it still persisted.  She noticed no blood in her stool.  Then  again this morning the patient continued to have abdominal pain and when she  went to the bathroom she saw a toilet bowl full of bright red blood after  she voided.  Again she continued to have abdominal pain but it decreased  after her bowel movements.  The family became concerned and had the patient  taken to see her PCD.  In the doctor's office a CBC was ordered and her H&H  was found to be stable.  Rectal exam done in the doctor's office showed  gross bright red blood.  The patient was brought into the emergency room.  She was given IV fluids.  She stated that she had not had anything to eat  for a couple of days secondary to nausea and abdominal pain.  Lab work was  ordered and the patient was assessed.  She  stated that she continued to have  crampy abdominal pain.  She denied any chest pain or shortness of breath.  She denied any other type of complaints as well.  A rectal exam done by  myself in the ER was consistent with the previous report of bright red blood  grossly.  A repeat H&H was stable.  The patient was noted to have an  elevated temperature of 99.7.  In addition she had a slightly elevated white  count.  Currently the patient denies any headaches or visual changes.  She  denies any dysphagia, chest pain, palpitations, shortness of breath, wheeze,  or cough.  She denies any problems with constipation or diarrhea.  She  denies any extremity pain or swelling.  She has some underlying osteoporosis  which has caused her pain but she cannot take any NSAIDS and takes Darvocet  as needed for pain.   PAST MEDICAL HISTORY:  1. Hypertension.  2. Diabetes mellitus.  3. Chronic renal insufficiency.  The patient has been told that she only has     50% of her renal function.  4. She also has GERD.  5. Some chronic pain and elevated lipids.   MEDICATIONS:  1. She is on Nexium 40 b.i.d.  2. Norvasc 10.  3. Lipitor 10.  4. Triamterene/hydrochlorothiazide 37.5/25.  5. Darvocet-N 100 one p.o. four times a day p.r.n.  6. Humulin 70/30 15 units twice a day.   ALLERGIES:  1. The patient is allergic to SULFA DRUGS.  2. PENICILLIN.  3. Virtual allergy to NSAIDS, as she cannot take these secondary to her     renal insufficiency.   SOCIAL HISTORY:  The patient does not smoke or drink.  She denies any drugs.  She lives at home and is usually well and functional.   REVIEW OF SYSTEMS:  She last had a colonoscopy done about four years ago  which was reportedly negative.  I do not have the copy of that report.  The  patient was never told if she had any diverticular problem.   LABORATORY DATA:  Done in the doctor's office:  Her white count is 19.4, H&H  14.5 and 42.6, with MCV of 94, platelets  are 280,000.  Her white count here  at [facility] ED is 17.1 with a 77% shift.  H&H 15.1, 42.5, platelets  276,000.  Her sodium is 138, potassium is 2.8, chloride 101, bicarb 28, BUN  31, creatinine 1.9, glucose 150.  Total bilirubin is 1.9, alkaline  phosphatase is 118.  These are just on the high end of normal for the  bilirubin and alkaline phosphatase.  AST 22, ALT 19, total protein 6.8,  albumin 3.8, calcium 8.8.   ASSESSMENT/PLAN:  1. Abdominal pain with a lower gastrointestinal bleed.  This may be a     diverticular bleed with a possible diverticulitis component.  This may be     also a problem with colonic ischemia as well.  Most likely the diagnosis     would be a cancer, especially in light of a colonoscopy done four years     ago which was reportedly negative.  Will IV hydrate the patient.  Will     have her p.o. contrast only and get a CT scan.  Will also ask [facility] to be in follow-up with the patient.  Will check serial     LFTs and H&Hs.  Will also make the patient n.p.o. as well.  2. In regards to the patient's hypertension, will hold all of her p.o.     medications for now and as needed may need to restart, but currently her     blood pressure is stable.  3. In regards to her hyperlipidemia, will hold her p.o. Lipitor for now.  4. In regards to her gastroesophageal reflux disease, will give her IV     Protonix.  In addition will also check blood cultures and will give IV     Cipro and Flagyl which is good anaerobic gut coverage as well.  Hollice Espy, M.D.    SKK/MEDQ  D:  11/25/2002  T:  11/25/2002  Job:  [id number]   cc:   Sharlet Salina, M.D.  [address]  [place]  [place] [address]  Fax: [phone]

## 2010-09-13 ENCOUNTER — Other Ambulatory Visit: Payer: Self-pay | Admitting: Family Medicine

## 2010-09-13 ENCOUNTER — Telehealth: Payer: Self-pay | Admitting: *Deleted

## 2010-09-13 NOTE — Telephone Encounter (Signed)
Jacki Cones gave son urine cup, wipes, and hat. Either son or hospice nurse will bring urine back in.

## 2010-09-13 NOTE — Telephone Encounter (Signed)
That is fine - I gave Jacki Cones the ok to give the cup to hospice to test her urine

## 2010-09-13 NOTE — Telephone Encounter (Signed)
Sara Lang says to call son if it is okay to come in and pick up urine cup. His number is [phone].

## 2010-09-13 NOTE — Telephone Encounter (Signed)
Sara Lang is asking if son can come by and pick up a urine cup and get a specimen from mom and bring it back. Sara Lang says that she has noticed a change in her behavior and has also had a recent fall, son hasn't noticed any other symptoms, but says that patient is really private with certain things.

## 2010-09-14 ENCOUNTER — Telehealth: Payer: Self-pay | Admitting: *Deleted

## 2010-09-14 MED ORDER — CIPROFLOXACIN HCL 250 MG PO TABS: 250.0000 mg | ORAL_TABLET | Freq: Two times a day (BID) | ORAL | Status: AC

## 2010-09-14 NOTE — Telephone Encounter (Signed)
Patient's son notified as instructed by telephone. Completed form faxed to [facility] [phone] as instructed. Medication phoned to [facility] pharmacy as instructed.

## 2010-09-14 NOTE — Telephone Encounter (Signed)
UA results faxed over, on your shelf.

## 2010-09-14 NOTE — Telephone Encounter (Signed)
Nurse with [facility] has faxed an order to be signed for UA and C & S.  Verbal ok given over the phone.  Order is on your shelf.

## 2010-09-14 NOTE — Telephone Encounter (Signed)
ua is pos for infection (and looks like culture is pending) I want to tx with renal dose cipro Warn caregiver that this drug can cause inc confusion in elderly - so to watch her  I will comment further when I get the cx Px written for call in

## 2010-09-15 ENCOUNTER — Telehealth: Payer: Self-pay

## 2010-09-15 ENCOUNTER — Encounter: Payer: Self-pay | Admitting: Family Medicine

## 2010-09-15 NOTE — Telephone Encounter (Signed)
Patient's son notified as instructed by telephone that urine culture was indeterminate. Pt to finish antibiotic and then to update Dr tower if pt has not improved. See scanned urine culture report.

## 2010-09-17 ENCOUNTER — Encounter: Payer: Self-pay | Admitting: Family Medicine

## 2010-09-17 ENCOUNTER — Telehealth: Payer: Self-pay | Admitting: *Deleted

## 2010-09-17 NOTE — Telephone Encounter (Signed)
Kathy with Hospice called to report that pt's UTI is improving on cipro.  Also, she wants to recert her for hospice.  She had thought that pt would be discharged but now says pt needs to stay under their care.  She is asking for documentation from pt's chart that pt has stage 4 cardiac disease, so that medicare will continue to pay for their services.  She wants documentation faxed to [phone].

## 2010-09-19 NOTE — Telephone Encounter (Signed)
I don't know if we can document that  Please ask her son if she has seen cardiol in the past and if so who? Send last 3 office notes please

## 2010-09-20 NOTE — Telephone Encounter (Signed)
Pt's son called back and said pt did not have cardiologist. She has seen Dr Fox,nephrologist for renal insufficiency and CHF. Last 3 office notes faxed to [facility] at hospice [phone] as instructed.

## 2010-09-20 NOTE — Telephone Encounter (Signed)
Thanks - please also send any nephrology notes that we happen to have --if any they may be from several years ago since she stopped going-thanks

## 2010-09-20 NOTE — Telephone Encounter (Signed)
Left v/m for pt's son to call office back.

## 2010-09-21 NOTE — Telephone Encounter (Signed)
[facility] note dated 03/31/2008 was faxed to [facility] at [phone] as instructed.

## 2010-11-08 ENCOUNTER — Other Ambulatory Visit: Payer: Self-pay | Admitting: *Deleted

## 2010-11-08 MED ORDER — LORAZEPAM 0.5 MG PO TABS: ORAL_TABLET | ORAL | Status: DC

## 2010-11-08 NOTE — Telephone Encounter (Signed)
Okay to refill? 

## 2010-11-08 NOTE — Telephone Encounter (Signed)
Medication phoned to [facility] pharmacy as instructed.  

## 2010-11-08 NOTE — Telephone Encounter (Signed)
Px written for call in   

## 2010-12-07 ENCOUNTER — Telehealth: Payer: Self-pay | Admitting: Family Medicine

## 2010-12-08 ENCOUNTER — Telehealth: Payer: Self-pay | Admitting: Family Medicine

## 2010-12-08 NOTE — Telephone Encounter (Signed)
Son Hessie Diener) call back # [phone].Marland KitchenMarland Kitchen

## 2010-12-08 NOTE — Telephone Encounter (Signed)
Patients son, Hessie Diener called this office on 12-07-2010, he was concerned about a bill his mother recieved and paid for a no show. Hessie Diener stated his mom became ill on her way to the office, stated his brother called the office and inform the staff that his mother would not be coming. Say she was having a panic attack, vomiting etc.  Hessie Diener says because his brother called the office, pt should not have been charged for the bill. Lurene Shadow I would discuss his concern with the office manager and call him back on today..cdavis 12-07-2010

## 2010-12-08 NOTE — Telephone Encounter (Signed)
Called pt son, per [facility] request. Left message......cdavis 12-08-2010

## 2010-12-08 NOTE — Telephone Encounter (Signed)
In this situation, no show charge should be waived due to nature of visit and they notified the office.  Please call patient and take care of no show charge being removed.  Thanks, [employer]

## 2010-12-16 NOTE — Telephone Encounter (Signed)
No show fee was waived. Spoke w/ pt made him aware...cdavis

## 2011-01-31 ENCOUNTER — Other Ambulatory Visit: Payer: Self-pay

## 2011-01-31 MED ORDER — LORAZEPAM 0.5 MG PO TABS: ORAL_TABLET | ORAL | Status: DC

## 2011-01-31 NOTE — Telephone Encounter (Signed)
Px written for call in   

## 2011-01-31 NOTE — Telephone Encounter (Signed)
[facility] faxed refill request for Lorazepam 0.5 mg. Last filled 12/31/10.Please advise.

## 2011-01-31 NOTE — Telephone Encounter (Signed)
Medication phoned to [facility] pharmacy as instructed.

## 2011-03-29 ENCOUNTER — Telehealth: Payer: Self-pay | Admitting: *Deleted

## 2011-03-29 NOTE — Telephone Encounter (Signed)
Spoke with nancy at [facility] [phone] and she sent message to triage that it was OK to see pt today.

## 2011-03-29 NOTE — Telephone Encounter (Signed)
Debra with [facility] called requesting approval for seeing patient today. She said that the patient has been under the care of [facility] and her family is very unhappy with her care through them. I advised that shouldn't be a problem for the switch. They will send you a new 485 on the patient once they have visited with her today.

## 2011-03-29 NOTE — Telephone Encounter (Signed)
That is fine with me.

## 2011-04-26 DIAGNOSIS — I509 Heart failure, unspecified: Secondary | ICD-10-CM

## 2011-04-26 DIAGNOSIS — R634 Abnormal weight loss: Secondary | ICD-10-CM

## 2011-05-03 ENCOUNTER — Other Ambulatory Visit: Payer: Self-pay | Admitting: *Deleted

## 2011-05-03 MED ORDER — LEVOTHYROXINE SODIUM 100 MCG PO TABS: 100.0000 ug | ORAL_TABLET | Freq: Every day | ORAL | Status: DC

## 2011-05-03 NOTE — Telephone Encounter (Signed)
Hospice patient

## 2011-05-05 ENCOUNTER — Other Ambulatory Visit: Payer: Self-pay | Admitting: *Deleted

## 2011-05-05 MED ORDER — ESOMEPRAZOLE MAGNESIUM 40 MG PO CPDR: 40.0000 mg | DELAYED_RELEASE_CAPSULE | Freq: Two times a day (BID) | ORAL | Status: DC

## 2011-05-05 NOTE — Telephone Encounter (Signed)
Will refill electronically , she is hospice pt

## 2011-05-05 NOTE — Telephone Encounter (Signed)
Okay to refill? 

## 2011-05-06 ENCOUNTER — Telehealth: Payer: Self-pay

## 2011-05-06 NOTE — Telephone Encounter (Signed)
Please ask her son if a switch would be ok and then I will recommend

## 2011-05-06 NOTE — Telephone Encounter (Signed)
Amy from [facility] called and Nexium #60 is to expensive and wondered if could change to pantaprozole or omeprazole. Please send medication to [facility] pharmacy.

## 2011-05-06 NOTE — Telephone Encounter (Signed)
Left v/m for pts son don to call back.

## 2011-05-09 MED ORDER — OMEPRAZOLE 20 MG PO CPDR: 20.0000 mg | DELAYED_RELEASE_CAPSULE | Freq: Every day | ORAL | Status: DC

## 2011-05-09 NOTE — Telephone Encounter (Signed)
Rob at [facility] said son has been notified med is ready.

## 2011-05-09 NOTE — Telephone Encounter (Signed)
Ok , will send px for omeprazole to [facility]

## 2011-05-09 NOTE — Telephone Encounter (Signed)
Patient's son [name] notified as instructed by telephone.[name] said it is OK to change from Nexium and [name] will wait to  Hear from [facility] when rx is ready.

## 2011-07-01 DIAGNOSIS — R634 Abnormal weight loss: Secondary | ICD-10-CM

## 2011-07-01 DIAGNOSIS — I509 Heart failure, unspecified: Secondary | ICD-10-CM

## 2011-07-14 ENCOUNTER — Telehealth: Payer: Self-pay | Admitting: *Deleted

## 2011-07-14 NOTE — Telephone Encounter (Signed)
Let her know that will have to wait until tomorrow when I am back in the office .... So I will do it first thing in the am  Send this back to me please, thanks

## 2011-07-14 NOTE — Telephone Encounter (Signed)
Kim called stating that she is filling in for nurse that usually sees this patient and is requesting a Rx refill for Oxycontin 10mg  XR.  Directions take one tablet by mouth twice daily.  This Rx is no on med list.  Advised that Dr. Milinda Antis is not in the office today.  Rx needs to be faxed to [facility] pharmacy.

## 2011-07-15 NOTE — Telephone Encounter (Signed)
Rx faxed to [facility], left message on voicemail for [place] advising that Rx was faxed.

## 2011-07-15 NOTE — Telephone Encounter (Signed)
Done and in IN box 

## 2011-07-15 NOTE — Telephone Encounter (Signed)
Sara Lang with [facility];  when oxycontin rx faxed to [facility] note on rx for "hospice patient". Call Sara Lang [phone] after fax completed.

## 2011-07-28 ENCOUNTER — Telehealth: Payer: Self-pay

## 2011-07-28 NOTE — Telephone Encounter (Signed)
[facility]  Request results for U/A and C&S faxed 07/27/11. Could not find fax Cassie will have results refaxed.[facility].

## 2011-07-29 NOTE — Telephone Encounter (Signed)
Informed Cassie w/[facility]osp[facility] of [facility] instructions/order: staph in first urine C&S believed to be contaminated specimen; hospice will do repeat culture w/"as clean a catch as possible". Caller understood & agreed/SLS

## 2011-08-03 ENCOUNTER — Telehealth: Payer: Self-pay | Admitting: *Deleted

## 2011-08-03 NOTE — Telephone Encounter (Signed)
Called and spoke to patient's son [name]) and gave him urine culture results from [facility]. (Report sent to be scanned into the chart) Was advised that patient is having bad mood swings, combative with son, using bad language and acting the same way that she did when she had the last UTI. Patient's son thinks that her dementia is getting worse and this may be the reason that she is acting the way that she is. Please advise.

## 2011-08-03 NOTE — Telephone Encounter (Signed)
Patient's son notified as instructed by telephone. Left message on voicemail for Cassie with Hospice to call back.

## 2011-08-03 NOTE — Telephone Encounter (Signed)
I agree , that is likely the case , I know it is difficult to get her in for a visit - but please so if he can  Please encourage hospice services to keep me updated and let me know if they have any further recommendations for meds or other measures that would help

## 2011-08-04 ENCOUNTER — Encounter: Payer: Self-pay | Admitting: Family Medicine

## 2011-08-08 ENCOUNTER — Encounter: Payer: Self-pay | Admitting: Family Medicine

## 2011-08-08 NOTE — Telephone Encounter (Signed)
Per Vo, MAT, patient's son [Don] informed CBC results were Normal & re-assuring; son understood & was grateful for the call/SLS

## 2011-08-15 ENCOUNTER — Telehealth: Payer: Self-pay

## 2011-08-15 MED ORDER — OXYCODONE HCL 10 MG PO TB12: 10.0000 mg | ORAL_TABLET | Freq: Two times a day (BID) | ORAL | Status: DC

## 2011-08-15 NOTE — Telephone Encounter (Signed)
Sara Lang with hospice of  request written rx for oxycontin ER 10 mg faxed to [facility] with Hospice pt written on rx. Hospice medical director has been writing since 03/2011; he is no longer with Hospice and request rx faxed.pt takes 1 tab q 12 hrs.for pain and SOB. Pt only has few tabs left. Dr Milinda Antis out of office Please advise.

## 2011-08-15 NOTE — Telephone Encounter (Signed)
Ok to send in.  Printed and placed in Kim's box.  Will route to Dr. Karie Schwalbe as Lorain Childes.

## 2011-08-16 NOTE — Telephone Encounter (Signed)
Rx faxed as directed. 

## 2011-08-17 ENCOUNTER — Telehealth: Payer: Self-pay | Admitting: *Deleted

## 2011-08-17 NOTE — Telephone Encounter (Signed)
Form for diabetic supplies is on your desk- for test strips and lancets.  This is the company that the patient uses.  Dr. Royden Purl patient.

## 2011-08-18 NOTE — Telephone Encounter (Signed)
Form faxed, copy sent for scanning. 

## 2011-08-18 NOTE — Telephone Encounter (Signed)
Form signed and in my box. 

## 2011-08-23 ENCOUNTER — Encounter: Payer: Self-pay | Admitting: Family Medicine

## 2011-08-30 DIAGNOSIS — I509 Heart failure, unspecified: Secondary | ICD-10-CM

## 2011-08-30 DIAGNOSIS — R634 Abnormal weight loss: Secondary | ICD-10-CM

## 2011-09-06 ENCOUNTER — Other Ambulatory Visit: Payer: Self-pay

## 2011-09-06 MED ORDER — OXYCODONE HCL 10 MG PO TB12: 10.0000 mg | ORAL_TABLET | Freq: Two times a day (BID) | ORAL | Status: DC

## 2011-09-06 NOTE — Telephone Encounter (Signed)
Px printed for fax in IN box

## 2011-09-06 NOTE — Telephone Encounter (Signed)
Faxed Rx to [facility] [phone]

## 2011-09-06 NOTE — Telephone Encounter (Signed)
Sara Lang with [facility] request rx faxed to [facility] for Oxycontin. Pt has 2 pills left. Please call Sara Lang when rx faxed.Please advise.

## 2011-10-13 ENCOUNTER — Other Ambulatory Visit: Payer: Self-pay

## 2011-10-13 MED ORDER — OXYCODONE HCL 10 MG PO TB12: 10.0000 mg | ORAL_TABLET | Freq: Two times a day (BID) | ORAL | Status: DC

## 2011-10-13 NOTE — Telephone Encounter (Signed)
Faxed Oxycontin rx to Rob at [facility]; Rob will call pt's son and pt will have med for tonight.

## 2011-10-13 NOTE — Telephone Encounter (Signed)
Please fax rx.  Thanks.

## 2011-10-13 NOTE — Telephone Encounter (Signed)
Sara Lang with [facility] request fax Oxycontin to [facility]. Sara Lang request call back when rx faxed to pharmacy.

## 2011-10-13 NOTE — Telephone Encounter (Signed)
Sharon with  [facility] left v/m at 4:56 pm that pt is out of oxycontin; request rx faxed to [facility]. Jasmine December did not leave call back.

## 2011-10-14 NOTE — Telephone Encounter (Signed)
Patsy from Hospice called to ck status of oxycontin rx. Advised faxed to [facility] 10/13/11 and verified with Rob at [place] son had picked up rx.

## 2011-11-04 DIAGNOSIS — I509 Heart failure, unspecified: Secondary | ICD-10-CM

## 2011-11-04 DIAGNOSIS — R634 Abnormal weight loss: Secondary | ICD-10-CM

## 2011-11-09 ENCOUNTER — Other Ambulatory Visit: Payer: Self-pay | Admitting: Family Medicine

## 2011-11-09 MED ORDER — OXYCODONE HCL 10 MG PO TB12: 10.0000 mg | ORAL_TABLET | Freq: Two times a day (BID) | ORAL | Status: DC

## 2011-11-09 NOTE — Telephone Encounter (Signed)
Fax Rx for oxycontin to pharm, will call tomorrow to let pt know Rx was sent

## 2011-11-09 NOTE — Telephone Encounter (Signed)
Berenice Bouton [phone] with Hospice calling to get a refill on patient's Oxycontin 10mg .  Needs a prescription faxed to [facility], states needs to have hospice wrote in the body of the script.  If questions please call Jasmine December back.  Thanks

## 2011-11-10 NOTE — Telephone Encounter (Signed)
Notified Berenice Bouton that pt's Rx was faxed to [facility] yesterday

## 2011-11-18 ENCOUNTER — Telehealth: Payer: Self-pay | Admitting: Family Medicine

## 2011-11-18 NOTE — Telephone Encounter (Signed)
Pharm said it was sent on the 19th and picked up on the 23rd so already completed

## 2011-11-18 NOTE — Telephone Encounter (Signed)
Hospice sent message- needs refil of her oxycontin ER 10 mg bid - need the px faxed to [facility] But I just checked refil hx -- was it not done on sept 18th? Let me know thanks

## 2011-11-30 ENCOUNTER — Other Ambulatory Visit: Payer: Self-pay

## 2011-11-30 MED ORDER — OXYCODONE HCL 10 MG PO TB12: 10.0000 mg | ORAL_TABLET | Freq: Two times a day (BID) | ORAL | Status: DC

## 2011-11-30 NOTE — Telephone Encounter (Signed)
Please fax px  In IN box thanks

## 2011-11-30 NOTE — Telephone Encounter (Signed)
Sara Lang with [facility] request rx faxed to [facility] for oxycodone. No contact # left.

## 2011-12-01 NOTE — Telephone Encounter (Signed)
Rx faxed to pharm

## 2011-12-06 ENCOUNTER — Ambulatory Visit (INDEPENDENT_AMBULATORY_CARE_PROVIDER_SITE_OTHER): Payer: Medicare Other | Admitting: Family Medicine

## 2011-12-06 ENCOUNTER — Telehealth: Payer: Self-pay | Admitting: Family Medicine

## 2011-12-06 ENCOUNTER — Encounter: Payer: Self-pay | Admitting: Family Medicine

## 2011-12-06 VITALS — BP 128/74 | HR 78 | Temp 98.3°F | Ht <= 58 in | Wt 108.5 lb

## 2011-12-06 DIAGNOSIS — E039 Hypothyroidism, unspecified: Secondary | ICD-10-CM

## 2011-12-06 DIAGNOSIS — E119 Type 2 diabetes mellitus without complications: Secondary | ICD-10-CM

## 2011-12-06 DIAGNOSIS — R413 Other amnesia: Secondary | ICD-10-CM

## 2011-12-06 DIAGNOSIS — Z23 Encounter for immunization: Secondary | ICD-10-CM

## 2011-12-06 DIAGNOSIS — G8929 Other chronic pain: Secondary | ICD-10-CM

## 2011-12-06 DIAGNOSIS — Z9181 History of falling: Secondary | ICD-10-CM | POA: Insufficient documentation

## 2011-12-06 DIAGNOSIS — I10 Essential (primary) hypertension: Secondary | ICD-10-CM

## 2011-12-06 LAB — BASIC METABOLIC PANEL
CO2: 32 mEq/L (ref 19–32)
Calcium: 8.9 mg/dL (ref 8.4–10.5)
Glucose, Bld: 197 mg/dL — ABNORMAL HIGH (ref 70–99)
Sodium: 138 mEq/L (ref 135–145)

## 2011-12-06 MED ORDER — LEVOTHYROXINE SODIUM 75 MCG PO TABS: 75.0000 ug | ORAL_TABLET | Freq: Every day | ORAL | Status: DC

## 2011-12-06 NOTE — Assessment & Plan Note (Signed)
Pt is elderly with very poor balance and on chronic strong pain medication  I strongly recommend use of walker at all times  She cannot stand from chair on her own and also has 24 hour supervision

## 2011-12-06 NOTE — Assessment & Plan Note (Signed)
Worsening with time but overall content Son will care for her at home as long as possible Declines any meds for dementia at this time

## 2011-12-06 NOTE — Assessment & Plan Note (Signed)
oxycontin is controlling pain  No longer on gout med Goal is comfort care- but pt was d/c from hospice due to not mtg qualifications

## 2011-12-06 NOTE — Assessment & Plan Note (Signed)
bp in fair control at this time  No changes needed  Disc lifstyle change with low sodium diet and exercise   Lab today-bmp Focus on comfort care now at pt's request

## 2011-12-06 NOTE — Progress Notes (Signed)
Subjective:    Patient ID: Sara Lang, female    DOB: September 25, 1920, 78 y.o.   MRN: [id number]  HPI Here for f/u of chronic conditions  Wt is down 15 lb from failure to thrive  bmi is 25  She outlived her prognosis  They very much appreciated her care And medicare rules have changes  No longer accepting the dx of failure to thrive   Son cares for her  No family to help  Strongly wants to avoid facility   Had been under the care of hospice for a while  She was discharged due to not enough dx to qualify  Chronic pain and gout Is getting by with her current pain medicine  Son gives this to her  Hands still hurt the most  Is using a quad cane to get around Has cane and a walker- but walker is broken and she needs a new one - has very poor balance especially on pain med  She cannot stand without assistance   Dementia continues to progress- good days and bad days   DM- sugars have been normal - has not taken insulin in a long time  Has cut down a lot of her medications in general   Hypothyroid Lab Results  Component Value Date   TSH 0.12* 04/07/2010   no change in energy or skin or hair   Flu shot today  Unsure if interested in zoster vaccine-needs to check on coverage  Patient Active Problem List  Diagnosis  . HYPOTHYROIDISM  . DIABETES MELLITUS, TYPE II  . VITAMIN B12 DEFICIENCY  . UNSPECIFIED VITAMIN D DEFICIENCY  . HYPERCHOLESTEROLEMIA  . GOUT  . OTHER CHRONIC PAIN  . SYNDROME, CHRONIC PAIN  . CARPAL TUNNEL SYNDROME  . NEUROPATHY  . HYPERTENSION  . GERD  . PEPTIC ULCER DISEASE  . RENAL FAILURE  . OSTEOARTHRITIS  . BACK PAIN, CHRONIC  . FOOT PAIN, CHRONIC  . OSTEOPENIA  . FATIGUE  . MEMORY LOSS  . JAUNDICE  . ADVERSE DRUG REACTION  . HYPERKALEMIA  . PEDAL EDEMA  . At risk for falling   Past Medical History  Diagnosis Date  . Unspecified adverse effect of unspecified drug, medicinal and biological substance   . Backache, unspecified   . Carpal  tunnel syndrome   . Type II or unspecified type diabetes mellitus without mention of complication, not stated as uncontrolled   . Other malaise and fatigue   . Pain in limb   . Esophageal reflux   . Gout, unspecified   . Pure hypercholesterolemia   . Hyperpotassemia   . Unspecified essential hypertension   . Unspecified hypothyroidism   . Jaundice, unspecified, not of newborn   . Memory loss   . Mononeuritis of unspecified site   . Osteoarthrosis, unspecified whether generalized or localized, unspecified site   . Disorder of bone and cartilage, unspecified   . Other chronic pain   . Edema   . Peptic ulcer, unspecified site, unspecified as acute or chronic, without mention of hemorrhage, perforation, or obstruction   . Renal failure, unspecified     secondary to Vioxx  . Chronic pain syndrome   . Unspecified vitamin D deficiency   . Other B-complex deficiencies   . Abnormal pancreatic function study     elevated enzymes  . History of transfusion of whole blood    Past Surgical History  Procedure Date  . Appendectomy 1928  . Cataract extraction   . Cholecystectomy 1956  .  Thyroidectomy     throat surgery-cold nodule  . Tonsillectomy   . Toe amputation     x2 Hammer toes  . Toe nail procedure   . Esophagogastroduodenoscopy     stricture   History  Substance Use Topics  . Smoking status: Never Smoker   . Smokeless tobacco: Not on file  . Alcohol Use: No   Family History  Problem Relation Age of Onset  . Hyperlipidemia Father   . Transient ischemic attack Son   . Depression Son   . Arthritis Mother   . Arthritis Father   . Arthritis      Grandparents   Allergies  Allergen Reactions  . Ace Inhibitors     REACTION: ace inhibitors cause inc K-  . Calcium     REACTION: nausea  . Donepezil Hydrochloride     REACTION: MS change  . Furosemide     REACTION: reaction not known  . Latex Itching    Red skin and itching  . Memantine     REACTION: MS change  .  Oxycodone-Aspirin     REACTION: reaction not known  . Penicillins     REACTION: reaction not known  . Pregabalin     REACTION: sick  . Rofecoxib     REACTION: renal failure  . Sulfonamide Derivatives     REACTION: reaction not known   Current Outpatient Prescriptions on File Prior to Visit  Medication Sig Dispense Refill  . furosemide (LASIX) 40 MG tablet Take 40 mg by mouth daily.      . Insulin Syringe-Needle U-100 (B-D INS SYR ULTRAFINE .5CC/30G) 30G X 1/2" 0.5 ML MISC by Does not apply route as directed.        . lidocaine (LIDODERM) 5 % Place 1 patch onto the skin daily. Remove & Discard patch within 12 hours or as directed by MD       . oxyCODONE (OXYCONTIN) 10 MG 12 hr tablet Take 1 tablet (10 mg total) by mouth every 12 (twelve) hours. Hospice patient.  60 tablet  0  . ranitidine (ZANTAC) 150 MG tablet Take 150 mg by mouth 2 (two) times daily.      . insulin NPH-insulin regular (NOVOLIN 70/30) (70-30) 100 UNIT/ML injection Inject into the skin as directed.        Marland Kitchen DISCONTD: esomeprazole (NEXIUM) 40 MG capsule Take 1 capsule (40 mg total) by mouth 2 (two) times daily.  60 capsule  11        Review of Systems     Objective:   Physical Exam  Constitutional: She appears well-developed and well-nourished. No distress.  HENT:  Head: Normocephalic and atraumatic.  Mouth/Throat: Oropharynx is clear and moist.  Eyes: Conjunctivae normal and EOM are normal. Pupils are equal, round, and reactive to light. Right eye exhibits no discharge. Left eye exhibits no discharge. No scleral icterus.  Neck: Normal range of motion. Neck supple. No JVD present. Carotid bruit is not present. No thyromegaly present.  Cardiovascular: Normal rate, regular rhythm, normal heart sounds and intact distal pulses.  Exam reveals no gallop.   Pulmonary/Chest: Effort normal and breath sounds normal. No respiratory distress. She has no wheezes.  Abdominal: Soft. Bowel sounds are normal. She exhibits no  distension, no abdominal bruit and no mass. There is no tenderness.  Musculoskeletal: She exhibits tenderness. She exhibits no edema.       Changes of moderate to severe OA and gout- with tenderness in fingers and knees  Lymphadenopathy:  She has no cervical adenopathy.  Neurological: She is alert. She has normal reflexes. No cranial nerve deficit. She exhibits normal muscle tone. Coordination normal.       Mild hand tremor   Skin: Skin is warm and dry. No rash noted. No erythema. No pallor.  Psychiatric: She has a normal mood and affect.          Assessment & Plan:

## 2011-12-06 NOTE — Telephone Encounter (Signed)
a1c up a bit but not bad  Cr (kidney number) is improved  tsh is low so we need to decrease her thyroid dose  Please re check tsh in 6-8 weeks for hypothyroidism Px written for call in

## 2011-12-06 NOTE — Patient Instructions (Addendum)
Continue to stress importance of regular meals  Flu shot today If you are interested in a shingles/zoster vaccine - call your insurance to check on coverage,( you should not get it within 1 month of other vaccines) , then call us for a prescription  for it to take to a pharmacy that gives the shot or get it here  I recommend use of a walker at all times to prevent falls  Labs today

## 2011-12-06 NOTE — Assessment & Plan Note (Signed)
With wt loss since last visit (some failure to thrive geriatric) Pt has not needed insulin at all- diet control  Disc need for regular meals

## 2011-12-06 NOTE — Assessment & Plan Note (Signed)
tsh today No clinical changes besides weight loss

## 2011-12-15 ENCOUNTER — Other Ambulatory Visit: Payer: Self-pay | Admitting: *Deleted

## 2011-12-15 MED ORDER — RANITIDINE HCL 150 MG PO TABS: 150.0000 mg | ORAL_TABLET | Freq: Two times a day (BID) | ORAL | Status: DC

## 2011-12-15 MED ORDER — FUROSEMIDE 40 MG PO TABS: 40.0000 mg | ORAL_TABLET | Freq: Every day | ORAL | Status: DC

## 2012-01-02 ENCOUNTER — Telehealth: Payer: Self-pay

## 2012-01-02 ENCOUNTER — Other Ambulatory Visit: Payer: Self-pay | Admitting: *Deleted

## 2012-01-02 MED ORDER — OXYCODONE HCL 10 MG PO TB12: 10.0000 mg | ORAL_TABLET | Freq: Two times a day (BID) | ORAL | Status: DC

## 2012-01-02 NOTE — Telephone Encounter (Signed)
Ok to refill? Pt d/c from hospice

## 2012-01-02 NOTE — Telephone Encounter (Signed)
Please call for prior auth-thanks

## 2012-01-02 NOTE — Telephone Encounter (Signed)
Aware she is no longer hospice pt  Px printed for pick up in IN box - her son will need to pick it up

## 2012-01-02 NOTE — Telephone Encounter (Signed)
Faxed received and placed in your inbox 

## 2012-01-02 NOTE — Telephone Encounter (Signed)
[facility] faxed note Oxycontin is not covered; Morphine ER or Nucynta is preferred.Please advise. Do you want to change med or try for prior authorization.Please advise.

## 2012-01-02 NOTE — Telephone Encounter (Signed)
Notified son Rx ready for pickup

## 2012-01-03 ENCOUNTER — Telehealth: Payer: Self-pay

## 2012-01-03 NOTE — Telephone Encounter (Signed)
Done and in IN box - bring response to me as soon as we get it , thanks

## 2012-01-03 NOTE — Telephone Encounter (Signed)
Notified pt's son of approval and faxed a copy of approval letter to [facility] and placed in your inbox

## 2012-01-03 NOTE — Telephone Encounter (Signed)
Received approval and placed in your Inbox

## 2012-01-03 NOTE — Telephone Encounter (Signed)
Done and in IN box 

## 2012-01-03 NOTE — Telephone Encounter (Signed)
[name] pts son left v/m; not covered but son contacted insurance co and he request our office to get approval for med. [employer] form for PA has been received and in process of sending to get approval. Pt is out of med. Dondra Prader said hydrocodone does not work.

## 2012-01-03 NOTE — Telephone Encounter (Signed)
Faxed prior auth

## 2012-04-21 ENCOUNTER — Ambulatory Visit: Payer: Self-pay | Admitting: Internal Medicine

## 2012-04-26 ENCOUNTER — Other Ambulatory Visit: Payer: Self-pay | Admitting: Family Medicine

## 2012-05-06 ENCOUNTER — Observation Stay: Payer: Self-pay | Admitting: Internal Medicine

## 2012-05-06 LAB — URINALYSIS, COMPLETE
Bilirubin,UR: NEGATIVE
Glucose,UR: >=500 mg/dL (ref 0–75)
Ketone: NEGATIVE
Nitrite: NEGATIVE
Ph: 6 (ref 4.5–8.0)
RBC,UR: <1 /HPF (ref 0–5)
Specific Gravity: 1.018 (ref 1.003–1.030)
WBC UR: 2 /HPF (ref 0–5)

## 2012-05-06 LAB — CBC
HCT: 46 % (ref 35.0–47.0)
HGB: 15.4 g/dL (ref 12.0–16.0)
MCH: 32.8 pg (ref 26.0–34.0)
MCHC: 33.6 g/dL (ref 32.0–36.0)
MCV: 98 fL (ref 80–100)
Platelet: 211 10*3/uL (ref 150–440)
RBC: 4.71 10*6/uL (ref 3.80–5.20)
RDW: 13.7 % (ref 11.5–14.5)
WBC: 12.7 10*3/uL — ABNORMAL HIGH (ref 3.6–11.0)

## 2012-05-06 LAB — TROPONIN I: Troponin-I: 0.03 ng/mL

## 2012-05-06 LAB — COMPREHENSIVE METABOLIC PANEL
Albumin: 4.1 g/dL (ref 3.4–5.0)
Alkaline Phosphatase: 168 U/L — ABNORMAL HIGH (ref 50–136)
Anion Gap: 6 — ABNORMAL LOW (ref 7–16)
BUN: 38 mg/dL — ABNORMAL HIGH (ref 7–18)
Bilirubin,Total: 1.1 mg/dL — ABNORMAL HIGH (ref 0.2–1.0)
Calcium, Total: 9.1 mg/dL (ref 8.5–10.1)
Chloride: 86 mmol/L — ABNORMAL LOW (ref 98–107)
Co2: 35 mmol/L — ABNORMAL HIGH (ref 21–32)
Creatinine: 2.3 mg/dL — ABNORMAL HIGH (ref 0.60–1.30)
EGFR (African American): 21 — ABNORMAL LOW
EGFR (Non-African Amer.): 18 — ABNORMAL LOW
Glucose: 611 mg/dL (ref 65–99)
Osmolality: 293 (ref 275–301)
Potassium: 4.3 mmol/L (ref 3.5–5.1)
SGOT(AST): 23 U/L (ref 15–37)
SGPT (ALT): 29 U/L (ref 12–78)
Sodium: 127 mmol/L — ABNORMAL LOW (ref 136–145)
Total Protein: 7.4 g/dL (ref 6.4–8.2)

## 2012-05-06 LAB — BASIC METABOLIC PANEL
BUN: 40 mg/dL — ABNORMAL HIGH (ref 7–18)
Calcium, Total: 9 mg/dL (ref 8.5–10.1)
EGFR (Non-African Amer.): 20 — ABNORMAL LOW
Glucose: 171 mg/dL — ABNORMAL HIGH (ref 65–99)
Potassium: 4 mmol/L (ref 3.5–5.1)

## 2012-05-06 LAB — CK TOTAL AND CKMB (NOT AT ARMC)
CK, Total: 82 U/L (ref 21–215)
CK-MB: 1.9 ng/mL (ref 0.5–3.6)

## 2012-05-07 ENCOUNTER — Telehealth: Payer: Self-pay

## 2012-05-07 LAB — BASIC METABOLIC PANEL
Anion Gap: 6 — ABNORMAL LOW (ref 7–16)
Calcium, Total: 8.1 mg/dL — ABNORMAL LOW (ref 8.5–10.1)
Chloride: 101 mmol/L (ref 98–107)
Co2: 30 mmol/L (ref 21–32)
Creatinine: 2.13 mg/dL — ABNORMAL HIGH (ref 0.60–1.30)
EGFR (African American): 23 — ABNORMAL LOW
EGFR (Non-African Amer.): 20 — ABNORMAL LOW
Glucose: 179 mg/dL — ABNORMAL HIGH (ref 65–99)
Potassium: 3.6 mmol/L (ref 3.5–5.1)

## 2012-05-07 LAB — COMPREHENSIVE METABOLIC PANEL
BUN: 42 mg/dL — ABNORMAL HIGH (ref 7–18)
Bilirubin,Total: 0.4 mg/dL (ref 0.2–1.0)
Chloride: 99 mmol/L (ref 98–107)
Co2: 26 mmol/L (ref 21–32)
EGFR (African American): 22 — ABNORMAL LOW
Osmolality: 284 (ref 275–301)
SGOT(AST): 30 U/L (ref 15–37)
SGPT (ALT): 25 U/L (ref 12–78)
Sodium: 134 mmol/L — ABNORMAL LOW (ref 136–145)

## 2012-05-07 LAB — TROPONIN I: Troponin-I: 0.15 ng/mL — ABNORMAL HIGH

## 2012-05-07 LAB — URINALYSIS, COMPLETE
Blood: NEGATIVE
Ketone: NEGATIVE
Nitrite: NEGATIVE
Ph: 7 (ref 4.5–8.0)
Protein: NEGATIVE
RBC,UR: <1 /HPF (ref 0–5)
Squamous Epithelial: 1
WBC UR: 3 /HPF (ref 0–5)

## 2012-05-07 LAB — CBC WITH DIFFERENTIAL/PLATELET
Basophil #: 0 10*3/uL (ref 0.0–0.1)
Basophil %: 0.4 %
Eosinophil #: 0.2 10*3/uL (ref 0.0–0.7)
Eosinophil %: 1.6 %
HGB: 13.1 g/dL (ref 12.0–16.0)
Lymphocyte #: 4.6 10*3/uL — ABNORMAL HIGH (ref 1.0–3.6)
Lymphocyte %: 44 %
MCH: 33.1 pg (ref 26.0–34.0)
MCHC: 34.5 g/dL (ref 32.0–36.0)
MCV: 96 fL (ref 80–100)
Monocyte #: 0.8 x10 3/mm (ref 0.2–0.9)
Monocyte %: 7.3 %
Neutrophil #: 4.9 10*3/uL (ref 1.4–6.5)
Neutrophil %: 46.7 %
Platelet: 169 10*3/uL (ref 150–440)
RBC: 3.94 10*6/uL (ref 3.80–5.20)
RDW: 13.7 % (ref 11.5–14.5)
WBC: 10.4 10*3/uL (ref 3.6–11.0)

## 2012-05-07 LAB — CBC
MCH: 32.1 pg (ref 26.0–34.0)
MCHC: 33 g/dL (ref 32.0–36.0)
MCV: 97 fL (ref 80–100)
RBC: 4.23 10*6/uL (ref 3.80–5.20)

## 2012-05-07 NOTE — Telephone Encounter (Signed)
Records received and placed in your inbox.

## 2012-05-07 NOTE — Telephone Encounter (Signed)
Please give the verbal order for that and send for her discharge summary from [facility]

## 2012-05-07 NOTE — Telephone Encounter (Signed)
Verbal order given and request sent for [facility] d/c summary

## 2012-05-07 NOTE — Telephone Encounter (Signed)
Debra with [facility] is being discharged from [facility] today and [facility] nursing and PT recommended; needs verbal order from Dr Milinda Antis.Please advise.

## 2012-05-08 ENCOUNTER — Inpatient Hospital Stay: Payer: Self-pay | Admitting: Internal Medicine

## 2012-05-08 DIAGNOSIS — R7989 Other specified abnormal findings of blood chemistry: Secondary | ICD-10-CM

## 2012-05-08 LAB — CK TOTAL AND CKMB (NOT AT ARMC)
CK, Total: 127 U/L (ref 21–215)
CK-MB: 2.9 ng/mL (ref 0.5–3.6)

## 2012-05-08 LAB — LIPID PANEL
Cholesterol: 196 mg/dL (ref 0–200)
Ldl Cholesterol, Calc: 108 mg/dL — ABNORMAL HIGH (ref 0–100)
Triglycerides: 246 mg/dL — ABNORMAL HIGH (ref 0–200)
VLDL Cholesterol, Calc: 49 mg/dL — ABNORMAL HIGH (ref 5–40)

## 2012-05-08 LAB — TROPONIN I
Troponin-I: 0.14 ng/mL — ABNORMAL HIGH
Troponin-I: 0.1 ng/mL — ABNORMAL HIGH

## 2012-05-08 LAB — TSH: Thyroid Stimulating Horm: 3.25 u[IU]/mL

## 2012-05-08 NOTE — Telephone Encounter (Signed)
FYI: Debra with [facility] called to let me know their services are on hold because pt had to be rushed back to the hospital last night, pt is back at [facility], once she is discharged they will start their services

## 2012-05-09 ENCOUNTER — Telehealth: Payer: Self-pay | Admitting: Family Medicine

## 2012-05-09 LAB — CBC WITH DIFFERENTIAL/PLATELET
Basophil #: 0 10*3/uL (ref 0.0–0.1)
Basophil %: 0.5 %
Eosinophil #: 0.1 10*3/uL (ref 0.0–0.7)
Eosinophil %: 1.6 %
HCT: 38.1 % (ref 35.0–47.0)
Lymphocyte #: 3.2 10*3/uL (ref 1.0–3.6)
Lymphocyte %: 38.3 %
MCH: 33.5 pg (ref 26.0–34.0)
MCHC: 34.4 g/dL (ref 32.0–36.0)
MCV: 97 fL (ref 80–100)
Monocyte #: 0.7 x10 3/mm (ref 0.2–0.9)
Monocyte %: 8.3 %
Neutrophil #: 4.3 10*3/uL (ref 1.4–6.5)
Platelet: 170 10*3/uL (ref 150–440)
RBC: 3.92 10*6/uL (ref 3.80–5.20)
RDW: 14.1 % (ref 11.5–14.5)
WBC: 8.5 10*3/uL (ref 3.6–11.0)

## 2012-05-09 LAB — BASIC METABOLIC PANEL
Anion Gap: 8 (ref 7–16)
BUN: 29 mg/dL — ABNORMAL HIGH (ref 7–18)
Calcium, Total: 8 mg/dL — ABNORMAL LOW (ref 8.5–10.1)
Co2: 27 mmol/L (ref 21–32)
EGFR (African American): 32 — ABNORMAL LOW
EGFR (Non-African Amer.): 28 — ABNORMAL LOW
Glucose: 227 mg/dL — ABNORMAL HIGH (ref 65–99)
Osmolality: 292 (ref 275–301)
Potassium: 3.9 mmol/L (ref 3.5–5.1)

## 2012-05-09 NOTE — Telephone Encounter (Signed)
Thanks for the update- please send for her H and P when it is ready so I can keep up with her progress

## 2012-05-09 NOTE — Telephone Encounter (Signed)
Faxed request for notes

## 2012-05-09 NOTE — Telephone Encounter (Signed)
Pt's son called to let Dr. Milinda Antis know that pt was admitted to [facility] for dizziness, disorientation and blood glucose over 600.  She is stable now but they are unsure of the cause.

## 2012-05-09 NOTE — Telephone Encounter (Signed)
Notes placed in your inbox. 

## 2012-05-10 ENCOUNTER — Telehealth: Payer: Self-pay | Admitting: *Deleted

## 2012-05-10 DIAGNOSIS — I359 Nonrheumatic aortic valve disorder, unspecified: Secondary | ICD-10-CM

## 2012-05-10 LAB — BASIC METABOLIC PANEL
Anion Gap: 6 — ABNORMAL LOW (ref 7–16)
BUN: 27 mg/dL — ABNORMAL HIGH (ref 7–18)
Calcium, Total: 7.8 mg/dL — ABNORMAL LOW (ref 8.5–10.1)
Chloride: 109 mmol/L — ABNORMAL HIGH (ref 98–107)
EGFR (Non-African Amer.): 28 — ABNORMAL LOW
Glucose: 245 mg/dL — ABNORMAL HIGH (ref 65–99)
Osmolality: 296 (ref 275–301)
Potassium: 3.9 mmol/L (ref 3.5–5.1)

## 2012-05-10 NOTE — Telephone Encounter (Signed)
Thanks for the update  I will rev discharge summary when it comes

## 2012-05-10 NOTE — Telephone Encounter (Signed)
FYI: pt is out of hospital and [facility] ([facility]) said pt was discharged with home nursing, OT, PT, and home aid, they are going out to pt's home to start services tomorrow (05/11/12)

## 2012-05-16 ENCOUNTER — Ambulatory Visit: Payer: Medicare Other | Admitting: Family Medicine

## 2012-05-21 ENCOUNTER — Encounter: Payer: Self-pay | Admitting: Family Medicine

## 2012-05-22 ENCOUNTER — Ambulatory Visit: Payer: Self-pay | Admitting: Internal Medicine

## 2012-05-22 ENCOUNTER — Ambulatory Visit: Payer: Medicare Other | Admitting: Family Medicine

## 2012-05-25 ENCOUNTER — Encounter: Payer: Self-pay | Admitting: Family Medicine

## 2012-05-27 DIAGNOSIS — N189 Chronic kidney disease, unspecified: Secondary | ICD-10-CM

## 2012-05-27 DIAGNOSIS — I129 Hypertensive chronic kidney disease with stage 1 through stage 4 chronic kidney disease, or unspecified chronic kidney disease: Secondary | ICD-10-CM

## 2012-05-27 DIAGNOSIS — E119 Type 2 diabetes mellitus without complications: Secondary | ICD-10-CM

## 2012-05-29 LAB — CBC
HCT: 39.6 % (ref 35.0–47.0)
MCH: 33.3 pg (ref 26.0–34.0)
Platelet: 215 10*3/uL (ref 150–440)
RBC: 4.1 10*6/uL (ref 3.80–5.20)

## 2012-05-29 LAB — COMPREHENSIVE METABOLIC PANEL
Alkaline Phosphatase: 99 U/L (ref 50–136)
Anion Gap: 7 (ref 7–16)
BUN: 30 mg/dL — ABNORMAL HIGH (ref 7–18)
Bilirubin,Total: 0.4 mg/dL (ref 0.2–1.0)
Calcium, Total: 8.5 mg/dL (ref 8.5–10.1)
EGFR (African American): 38 — ABNORMAL LOW
EGFR (Non-African Amer.): 33 — ABNORMAL LOW
Glucose: 172 mg/dL — ABNORMAL HIGH (ref 65–99)
Osmolality: 288 (ref 275–301)
Potassium: 3.9 mmol/L (ref 3.5–5.1)
Sodium: 139 mmol/L (ref 136–145)

## 2012-05-30 ENCOUNTER — Inpatient Hospital Stay: Payer: Self-pay | Admitting: Internal Medicine

## 2012-05-30 DIAGNOSIS — R4182 Altered mental status, unspecified: Secondary | ICD-10-CM

## 2012-05-30 DIAGNOSIS — I498 Other specified cardiac arrhythmias: Secondary | ICD-10-CM

## 2012-05-30 LAB — URINALYSIS, COMPLETE
Bilirubin,UR: NEGATIVE
Blood: NEGATIVE
Hyaline Cast: 4
Nitrite: NEGATIVE
Ph: 5 (ref 4.5–8.0)
Protein: NEGATIVE
RBC,UR: 1 /HPF (ref 0–5)
WBC UR: 4 /HPF (ref 0–5)

## 2012-05-31 ENCOUNTER — Telehealth: Payer: Self-pay

## 2012-05-31 ENCOUNTER — Telehealth: Payer: Self-pay | Admitting: Family Medicine

## 2012-05-31 LAB — CBC WITH DIFFERENTIAL/PLATELET
Basophil %: 0.4 %
Eosinophil #: 0.1 10*3/uL (ref 0.0–0.7)
Eosinophil %: 1.7 %
HCT: 36 % (ref 35.0–47.0)
HGB: 12.2 g/dL (ref 12.0–16.0)
Lymphocyte %: 50.7 %
MCH: 33.1 pg (ref 26.0–34.0)
MCV: 97 fL (ref 80–100)
Neutrophil #: 3.1 10*3/uL (ref 1.4–6.5)
RBC: 3.7 10*6/uL — ABNORMAL LOW (ref 3.80–5.20)
RDW: 14.2 % (ref 11.5–14.5)

## 2012-05-31 LAB — BASIC METABOLIC PANEL
BUN: 32 mg/dL — ABNORMAL HIGH (ref 7–18)
Calcium, Total: 8.5 mg/dL (ref 8.5–10.1)
Chloride: 109 mmol/L — ABNORMAL HIGH (ref 98–107)
Glucose: 158 mg/dL — ABNORMAL HIGH (ref 65–99)
Osmolality: 295 (ref 275–301)

## 2012-05-31 NOTE — Telephone Encounter (Signed)
TCM  

## 2012-05-31 NOTE — Telephone Encounter (Signed)
Amil Amen with [facility] left v/m; pt discharged from [facility] today; Amil Amen request verbal orders for resuming home health nursing,PT and OT.Please advise.

## 2012-05-31 NOTE — Telephone Encounter (Signed)
Message copied by [facility], Zurisadai Helminiak E on Thu May 31, 2012  2:07 PM ------      Message from: Sandre Kitty F      Created: Thu May 31, 2012  2:05 PM      Regarding: tcm/ph       Pt scheduled 06/05/12 with Dr. Mariah Milling ------

## 2012-05-31 NOTE — Telephone Encounter (Signed)
Please give the ok, and request discharge summary- and please see how she is doing and make f/u with me within the next 2 weeks thanks

## 2012-05-31 NOTE — Telephone Encounter (Signed)
On 05/29/12 at approximately 4:00pm, patient's son, Al, came in to our office today demanding to speak to the Engineer, site". Ricki Miller, who was at the front window, told him that our manager was not here but that the RN team lead was and he wanted to speak with me. I introduced myself and took him into Adrienne's office. He had a pen and paper and said to me "I'm going to write down every single word you say". The entire time he spoke to me, he had his teeth clenched and a raised voice. He told me that he was reporting Dr. Milinda Antis to the Board of Medicine for prescribing his mother a "bag full" of medication that did not help her, it "made her worse". He said that he is reporting our office for Medicare fraud for charging his mother "inappropriate co-pays". He said that his brother, Marton Redwood, called to report that his mother had been readmitted to the hospital within 8 hours of discharge and that Dr. Milinda Antis never called to check on her and that it was "inappropriate care". He said that he is hiring an Pensions consultant and will take every person that works here to court. He also took his finger and slid it across his throat while saying "I will take down everyone in this place". He said he is going to get a copy of "every piece of medical information in her chart". I apologized to him and told him I understand his frustration. I told him that he can request her medical records if he has permission. I offered to have Adrienne call or meet with him once she returns and he replied "I don't need or want to speak with her." He said that this better not be mentioned to his brother when he brings their mother in for her appointments. He was very threatening during his entire conversation with me, so much so that the front office staff thought they may need to call the police.  Charline Bills, office manager, and Rolinda Roan, site director, notified.

## 2012-06-01 NOTE — Telephone Encounter (Signed)
Sounds good, I will see her then

## 2012-06-01 NOTE — Telephone Encounter (Signed)
Verbal order given to [facility], requested pt's d/c summary but [facility] said d/c not available yet but they will fax it when it is ready, and I scheduled f/u appt with pt's son on 06/15/12, pt's son said pt is doing better and back at baseline. They put her on amlodipine and that seems to be helping with her hypertension

## 2012-06-01 NOTE — Telephone Encounter (Signed)
TCM call I was able to speak with son who is pt's POA He is with pt now and is giving her meds and checking blood sugar Says pt dizziness has improved Denies sob HH nurse coming to home at 2 pm today Confirms compliance with medications as prescribed Has all meds from pharm Confirms knowledge of appt with [place] 4/15 Will call us sooner if needed

## 2012-06-05 ENCOUNTER — Encounter: Payer: Medicare Other | Admitting: Cardiovascular Disease

## 2012-06-05 ENCOUNTER — Other Ambulatory Visit: Payer: Self-pay | Admitting: Family Medicine

## 2012-06-05 NOTE — Telephone Encounter (Signed)
It is 100 now -on her hosp discharge Please refil for 6 months Thanks  She will be seeing me later this month

## 2012-06-05 NOTE — Telephone Encounter (Signed)
Ok to refill? We have pt taking on med list, please advise

## 2012-06-05 NOTE — Telephone Encounter (Signed)
done

## 2012-06-15 ENCOUNTER — Ambulatory Visit: Payer: Medicare Other | Admitting: Family Medicine

## 2012-06-15 DIAGNOSIS — Z0289 Encounter for other administrative examinations: Secondary | ICD-10-CM

## 2012-06-18 ENCOUNTER — Telehealth: Payer: Self-pay | Admitting: *Deleted

## 2012-06-18 NOTE — Telephone Encounter (Signed)
I called pt's son because pt missed her appt on Friday, pt's son returned my call and said he fell down his stairs and hurt his head and back and he was not able to lift pt. Pt is having some leg pain and a little swelling in her right leg, and also pain in knees and she has having a little difficulty walking and her son couldn't help her. Pt's son did want me to let you know that the [facility] path nurse came out to check on pt on Thursday 06/14/12 and all her vitals were normal (BP 117/70 an o2 99%), While in the hospital doc put pt on insulin and cut her lasix in 1/2 and that seems to be helping. Pt's son said he doesn't feel like pt has anything emergent that she needs to be seen asap for but once his back and head is healed he will call back an scheduled pt a hospital f/u

## 2012-06-18 NOTE — Telephone Encounter (Signed)
I am reassured that nurse is coming to check on her - please get in for a follow up when you can - if not able to within the next several weeks- please let me know , thanks

## 2012-06-27 ENCOUNTER — Telehealth: Payer: Self-pay

## 2012-06-27 NOTE — Telephone Encounter (Signed)
Adrienne -I do not think this is Dominic (the POA) of this patient - see last few phone notes- Dominic  was unable to bring her in after her hospitalization due to a back problem (the caregiver's)

## 2012-06-27 NOTE — Telephone Encounter (Signed)
I spoke to him - he is aware of his brother's behavior and promises it will not happen again - he is trying to get some help for his brother  Sara Lang is reportedly doing ok - her dementia waxes and wanes/ mobility is poor and blood sugar stable, home nurse has been helpful and he does not feel overwhelmed with her care at this time He will call back to make her hospital follow up appt as soon as he can

## 2012-06-27 NOTE — Telephone Encounter (Signed)
Received cal from Dominic he wants Dr. Milinda Antis and Hansel Starling to both call him back he wants to discuss his brother Sara Lang's behavior and apologize for him, he said his mom is ok today but she has been up and down lately

## 2012-06-27 NOTE — Telephone Encounter (Signed)
Patient's son Jalessa Peyser called today asking to speak with me (the site Production designer, theatre/television/film).  When we first got on the phone he was extremely upset and speaking in a harsh tone about his mother's healthcare and how he is going to sue [place] for all we are worth.  I explained to him that he was not to come back on our property because he acted inappropriately and scared everyone who works here when he came into the office 2 weeks ago. I apologized that he was upset but regardless, he could not come back.  He started crying and apologized for his behavior.  He understands he cannot come back to our office.  Also spoke with patient's other son Dominic.  He said he brother Al called and told him about his behaviors.  Dominic also apologized profusely for his brother and stated no legal action would be taken because Dr. Milinda Antis hasn't done anything wrong and she is a great doctor. He also stated he was going to fill out a new release of information sheet stating NO ONE else could get his mother's medical information.  Not even his brother, Al.

## 2012-06-27 NOTE — Telephone Encounter (Signed)
[facility] (After Hours Triage) Fax: [phone] From: [facility] Date/ Time: 06/27/2012 4:05 AM Taken By: Gypsy Decant, RN Caller: Al Facility: not collected Patient: Sara Lang, Sara Lang DOB: 1920-12-25 Phone: [phone] Reason for Call: Needs office Administrator to call him asap regarding his mom

## 2012-07-27 ENCOUNTER — Other Ambulatory Visit: Payer: Self-pay | Admitting: Family Medicine

## 2012-07-27 MED ORDER — AMLODIPINE BESYLATE 5 MG PO TABS: 5.0000 mg | ORAL_TABLET | Freq: Two times a day (BID) | ORAL | Status: DC

## 2012-07-27 NOTE — Telephone Encounter (Signed)
Received fax from pharmacy that this Rx was prescribed by a doctor when pt was in hospital, and pt is requesting that we start refilling medication, please advise

## 2012-07-27 NOTE — Telephone Encounter (Signed)
That is ok - please refill for 6 mo

## 2012-07-27 NOTE — Telephone Encounter (Signed)
done

## 2012-08-08 ENCOUNTER — Telehealth: Payer: Self-pay | Admitting: *Deleted

## 2012-08-08 NOTE — Telephone Encounter (Signed)
FYI, pharmacy changed brands of levothyroxine, they just wanted to make you aware in case you wanted to keep an eye on her levels.

## 2012-08-08 NOTE — Telephone Encounter (Signed)
That is fine 

## 2012-09-10 ENCOUNTER — Other Ambulatory Visit: Payer: Self-pay | Admitting: Family Medicine

## 2012-09-10 NOTE — Telephone Encounter (Signed)
Please schedule f/u in sept (let's see if her son Marton Redwood can get her in then) and refill until then

## 2012-09-10 NOTE — Telephone Encounter (Signed)
Electronic refill request, no recent/future appt., please advise  

## 2012-09-11 NOTE — Telephone Encounter (Signed)
F/u appt scheduled for 10/29/12 and med refilled until then

## 2012-10-29 ENCOUNTER — Ambulatory Visit: Payer: Medicare Other | Admitting: Family Medicine

## 2012-11-02 ENCOUNTER — Encounter: Payer: Self-pay | Admitting: Family Medicine

## 2012-11-02 ENCOUNTER — Ambulatory Visit (INDEPENDENT_AMBULATORY_CARE_PROVIDER_SITE_OTHER): Payer: Medicare Other | Admitting: Family Medicine

## 2012-11-02 VITALS — BP 132/72 | HR 68 | Temp 98.5°F | Ht <= 58 in | Wt 114.5 lb

## 2012-11-02 DIAGNOSIS — E039 Hypothyroidism, unspecified: Secondary | ICD-10-CM

## 2012-11-02 DIAGNOSIS — N19 Unspecified kidney failure: Secondary | ICD-10-CM

## 2012-11-02 DIAGNOSIS — Z23 Encounter for immunization: Secondary | ICD-10-CM

## 2012-11-02 DIAGNOSIS — E119 Type 2 diabetes mellitus without complications: Secondary | ICD-10-CM

## 2012-11-02 DIAGNOSIS — I10 Essential (primary) hypertension: Secondary | ICD-10-CM

## 2012-11-02 DIAGNOSIS — R413 Other amnesia: Secondary | ICD-10-CM

## 2012-11-02 LAB — COMPREHENSIVE METABOLIC PANEL
BUN: 24 mg/dL — ABNORMAL HIGH (ref 6–23)
CO2: 29 mEq/L (ref 19–32)
Calcium: 9.1 mg/dL (ref 8.4–10.5)
Chloride: 104 mEq/L (ref 96–112)
Creatinine, Ser: 1.4 mg/dL — ABNORMAL HIGH (ref 0.4–1.2)
GFR: 39.15 mL/min — ABNORMAL LOW (ref >60.00)

## 2012-11-02 NOTE — Progress Notes (Signed)
Subjective:    Patient ID: Sara Lang, female    DOB: 1920/09/30, 79 y.o.   MRN: [id number]  HPI Here for f/u of chronic medical problems   Is doing ok overall  A lot of chronic aches and pains physically  Her mental state and attitude are quite good   Wt is up 6 lb with bmi of 26 Eating very well - loves to eat  Does eat some junk food - in moderation     bp is stable today  No cp or palpitations or headaches or edema  No side effects to medicines  BP Readings from Last 3 Encounters:  11/02/12 132/72  12/06/11 128/74  04/23/10 138/64      Renal insuff   Chemistry      Component Value Date/Time   NA 138 12/06/2011 0922   K 4.0 12/06/2011 0922   CL 97 12/06/2011 0922   CO2 32 12/06/2011 0922   BUN 30* 12/06/2011 0922   CREATININE 1.6* 12/06/2011 0922      Component Value Date/Time   CALCIUM 8.9 12/06/2011 0922   ALKPHOS 93 04/07/2010 1202   AST 15 04/07/2010 1202   ALT 12 04/07/2010 1202   BILITOT 0.4 04/07/2010 1202     no urinary symptoms at all  Has not been uremic and she declines tx or eval further for renal insuff  DM Lab Results  Component Value Date   HGBA1C 6.9* 12/06/2011   sugar at home tends to run in the 120s No hypoglycemia , and no very high sugars  Eats good meals - tries to get lean protein  On sliding scale insulin - if she really needs it -- trying to avoid any hypoglycemia    Son cares for her at home - overall doing ok   Due to check thyroid -no clinical changes   Uses a walker at home  Has a 4 prong cane to use going out when she is with someone to hold her arm   No falls recently - very careful and always assisted with ambulation  supp hose - wears most of the time for edema Lasix cut in 1/2 is helping  Due for K change   Dementia- memory is gradually worsening  She realizes it - and tolerating it ok  Son knows she will get combative at times - but he is able to handle that    Patient Active Problem List   Diagnosis  Date Noted  . At risk for falling 12/06/2011  . HYPERKALEMIA 04/23/2010  . ADVERSE DRUG REACTION 03/27/2009  . GOUT 09/25/2008  . OTHER CHRONIC PAIN 08/14/2008  . VITAMIN B12 DEFICIENCY 07/02/2008  . UNSPECIFIED VITAMIN D DEFICIENCY 07/02/2008  . MEMORY LOSS 07/02/2008  . FATIGUE 06/25/2008  . BACK PAIN, CHRONIC 08/04/2006  . FOOT PAIN, CHRONIC 08/04/2006  . OSTEOPENIA 08/04/2006  . HYPOTHYROIDISM 08/02/2006  . DIABETES MELLITUS, TYPE II 08/02/2006  . HYPERCHOLESTEROLEMIA 08/02/2006  . SYNDROME, CHRONIC PAIN 08/02/2006  . CARPAL TUNNEL SYNDROME 08/02/2006  . NEUROPATHY 08/02/2006  . HYPERTENSION 08/02/2006  . GERD 08/02/2006  . PEPTIC ULCER DISEASE 08/02/2006  . RENAL FAILURE 08/02/2006  . OSTEOARTHRITIS 08/02/2006  . JAUNDICE 08/02/2006  . PEDAL EDEMA 08/02/2006   Past Medical History  Diagnosis Date  . Unspecified adverse effect of unspecified drug, medicinal and biological substance   . Backache, unspecified   . Carpal tunnel syndrome   . Type II or unspecified type diabetes mellitus without mention of complication, not stated as  uncontrolled   . Other malaise and fatigue   . Pain in limb   . Esophageal reflux   . Gout, unspecified   . Pure hypercholesterolemia   . Hyperpotassemia   . Unspecified essential hypertension   . Unspecified hypothyroidism   . Jaundice, unspecified, not of newborn   . Memory loss   . Mononeuritis of unspecified site   . Osteoarthrosis, unspecified whether generalized or localized, unspecified site   . Disorder of bone and cartilage, unspecified   . Other chronic pain   . Edema   . Peptic ulcer, unspecified site, unspecified as acute or chronic, without mention of hemorrhage, perforation, or obstruction   . Renal failure, unspecified     secondary to Vioxx  . Chronic pain syndrome   . Unspecified vitamin D deficiency   . Other B-complex deficiencies   . Abnormal pancreatic function study     elevated enzymes  . History of  transfusion of whole blood    Past Surgical History  Procedure Laterality Date  . Appendectomy  1928  . Cataract extraction    . Cholecystectomy  1956  . Thyroidectomy      throat surgery-cold nodule  . Tonsillectomy    . Toe amputation      x2 Hammer toes  . Toe nail procedure    . Esophagogastroduodenoscopy      stricture   History  Substance Use Topics  . Smoking status: Never Smoker   . Smokeless tobacco: Not on file  . Alcohol Use: No   Family History  Problem Relation Age of Onset  . Hyperlipidemia Father   . Transient ischemic attack Son   . Depression Son   . Arthritis Mother   . Arthritis Father   . Arthritis      Grandparents   Allergies  Allergen Reactions  . Ace Inhibitors     REACTION: ace inhibitors cause inc K-  . Calcium     REACTION: nausea  . Donepezil Hydrochloride     REACTION: MS change  . Furosemide     REACTION: reaction not known  . Latex Itching    Red skin and itching  . Memantine     REACTION: MS change  . Oxycodone-Aspirin     REACTION: reaction not known  . Penicillins     REACTION: reaction not known  . Pregabalin     REACTION: sick  . Rofecoxib     REACTION: renal failure  . Sulfonamide Derivatives     REACTION: reaction not known   Current Outpatient Prescriptions on File Prior to Visit  Medication Sig Dispense Refill  . amLODipine (NORVASC) 5 MG tablet Take 1 tablet (5 mg total) by mouth every 12 (twelve) hours.  60 tablet  5  . furosemide (LASIX) 40 MG tablet Take 1 tablet (40 mg total) by mouth daily.  30 tablet  11  . Insulin Syringe-Needle U-100 (B-D INS SYR ULTRAFINE .5CC/30G) 30G X 1/2" 0.5 ML MISC by Does not apply route as directed.        Marland Kitchen levothyroxine (SYNTHROID, LEVOTHROID) 100 MCG tablet TAKE ONE (1) TABLET BY MOUTH EVERY DAY  30 tablet  5  . lidocaine (LIDODERM) 5 % Place 1 patch onto the skin daily. Remove & Discard patch within 12 hours or as directed by MD       . omeprazole (PRILOSEC) 20 MG capsule  TAKE ONE CAPSULE BY MOUTH DAILY  30 capsule  1  . ranitidine (ZANTAC) 150 MG tablet Take  1 tablet (150 mg total) by mouth 2 (two) times daily.  60 tablet  11  . insulin NPH-insulin regular (NOVOLIN 70/30) (70-30) 100 UNIT/ML injection Inject into the skin as directed.        . [DISCONTINUED] esomeprazole (NEXIUM) 40 MG capsule Take 1 capsule (40 mg total) by mouth 2 (two) times daily.  60 capsule  11   No current facility-administered medications on file prior to visit.    Review of Systems Review of Systems  Constitutional: Negative for fever, appetite change,  and unexpected weight change.  Eyes: Negative for pain and visual disturbance.  Respiratory: Negative for cough and shortness of breath.   Cardiovascular: Negative for cp or palpitations    Gastrointestinal: Negative for nausea, diarrhea and constipation.  Genitourinary: Negative for urgency and frequency.  Skin: Negative for pallor or rash  MSK pos for OA pain - esp in knees and feet/ no recent joint redness   Neurological: Negative for weakness, light-headedness, numbness and headaches.  Hematological: Negative for adenopathy. Does not bruise/bleed easily.  Psychiatric/Behavioral: Negative for dysphoric mood. The patient is not nervous/anxious.         Objective:   Physical Exam  Constitutional: She appears well-developed and well-nourished. No distress.  Frail appearing elderly female-no distress  HENT:  Head: Normocephalic and atraumatic.  Mouth/Throat: Oropharynx is clear and moist. No oropharyngeal exudate.  Eyes: Conjunctivae and EOM are normal. Pupils are equal, round, and reactive to light. Right eye exhibits no discharge. Left eye exhibits no discharge. No scleral icterus.  Neck: Normal range of motion. Neck supple. No JVD present. Carotid bruit is not present. No thyromegaly present.  Cardiovascular: Normal rate, regular rhythm, normal heart sounds and intact distal pulses.  Exam reveals no gallop.    Pulmonary/Chest: Effort normal and breath sounds normal. No respiratory distress. She has no wheezes. She has no rales.  Abdominal: Soft. Bowel sounds are normal. She exhibits no distension, no abdominal bruit and no mass. There is no tenderness.  Musculoskeletal: She exhibits tenderness. She exhibits no edema.  Poor rom of knees/ ankles with evidence of OA in peripheral joints  Slow gait  Lymphadenopathy:    She has no cervical adenopathy.  Neurological: She is alert. She has normal reflexes. No cranial nerve deficit. She exhibits normal muscle tone. Coordination normal.  Skin: Skin is warm and dry. No rash noted. No erythema. No pallor.  Psychiatric: Her mood appears not anxious. She is not agitated and not aggressive. Cognition and memory are impaired. She does not exhibit a depressed mood. She exhibits abnormal recent memory.  Mentally fairly sharp today (per son a good day) Poor short term memory          Assessment & Plan:

## 2012-11-02 NOTE — Patient Instructions (Addendum)
I'm glad you are doing so well  And you had your flu shot today Labs for chemistry / kidney function/A1C and thyroid function  Take care of yourself  Use walker when you need it - be very careful to prevent falls

## 2012-11-04 NOTE — Assessment & Plan Note (Signed)
Pt chooses not to treat Avoiding nephrotoxic meds Watching DM control and bp control  Stressed imp of water intake

## 2012-11-04 NOTE — Assessment & Plan Note (Signed)
Pt uses insulin on a sliding scale and she has a good appetite At her age - does not watch her diet too carefully No hypoglycemia episodes  Most interested in comfort at this time Flu vaccine today Lab today

## 2012-11-04 NOTE — Assessment & Plan Note (Signed)
Hypothyroidism  Pt has no clinical changes No change in energy level/ hair or skin/ edema and no tremor tsh today 

## 2012-11-04 NOTE — Assessment & Plan Note (Signed)
bp in fair control at this time  No changes needed  Disc lifstyle change with low sodium diet and exercise  Edema is fairly controlled with low dose lasix

## 2012-11-04 NOTE — Assessment & Plan Note (Signed)
This continues to progress Not interested in medicines for this  Per son - has occ agitation -but not severe  She has 24 hour care with son  Disc safety

## 2012-11-06 ENCOUNTER — Other Ambulatory Visit: Payer: Self-pay | Admitting: *Deleted

## 2012-11-06 MED ORDER — LEVOTHYROXINE SODIUM 75 MCG PO TABS: 75.0000 ug | ORAL_TABLET | Freq: Every day | ORAL | Status: DC

## 2012-11-08 ENCOUNTER — Other Ambulatory Visit: Payer: Self-pay | Admitting: Family Medicine

## 2012-12-21 ENCOUNTER — Other Ambulatory Visit: Payer: Self-pay | Admitting: Family Medicine

## 2012-12-21 DIAGNOSIS — E039 Hypothyroidism, unspecified: Secondary | ICD-10-CM

## 2012-12-25 ENCOUNTER — Other Ambulatory Visit: Payer: Medicare Other

## 2012-12-26 ENCOUNTER — Other Ambulatory Visit (INDEPENDENT_AMBULATORY_CARE_PROVIDER_SITE_OTHER): Payer: Medicare Other

## 2012-12-26 DIAGNOSIS — E039 Hypothyroidism, unspecified: Secondary | ICD-10-CM

## 2012-12-26 LAB — TSH: TSH: 0.75 u[IU]/mL (ref 0.35–5.50)

## 2012-12-27 ENCOUNTER — Encounter: Payer: Self-pay | Admitting: *Deleted

## 2013-01-11 ENCOUNTER — Other Ambulatory Visit: Payer: Self-pay | Admitting: Family Medicine

## 2013-02-14 ENCOUNTER — Inpatient Hospital Stay: Payer: Self-pay | Admitting: Internal Medicine

## 2013-02-14 LAB — URINALYSIS, COMPLETE
Bilirubin,UR: NEGATIVE
Glucose,UR: >=500 mg/dL (ref 0–75)
Leukocyte Esterase: NEGATIVE
Nitrite: NEGATIVE
Protein: NEGATIVE
RBC,UR: <1 /HPF (ref 0–5)
Specific Gravity: 1.018 (ref 1.003–1.030)
Squamous Epithelial: NONE SEEN

## 2013-02-14 LAB — CBC
HCT: 44.3 % (ref 35.0–47.0)
HGB: 14.6 g/dL (ref 12.0–16.0)
MCH: 32.1 pg (ref 26.0–34.0)
MCV: 97 fL (ref 80–100)
Platelet: 202 10*3/uL (ref 150–440)
RBC: 4.56 10*6/uL (ref 3.80–5.20)
RDW: 14.1 % (ref 11.5–14.5)

## 2013-02-14 LAB — COMPREHENSIVE METABOLIC PANEL
Anion Gap: 12 (ref 7–16)
BUN: 32 mg/dL — ABNORMAL HIGH (ref 7–18)
Calcium, Total: 9.1 mg/dL (ref 8.5–10.1)
Chloride: 84 mmol/L — ABNORMAL LOW (ref 98–107)
Co2: 27 mmol/L (ref 21–32)
Osmolality: 304 (ref 275–301)
Potassium: 3.7 mmol/L (ref 3.5–5.1)
SGPT (ALT): 28 U/L (ref 12–78)
Sodium: 123 mmol/L — ABNORMAL LOW (ref 136–145)
Total Protein: 7.3 g/dL (ref 6.4–8.2)

## 2013-02-14 LAB — PHOSPHORUS: Phosphorus: 4.5 mg/dL (ref 2.5–4.9)

## 2013-02-14 LAB — TROPONIN I: Troponin-I: <0.02 ng/mL

## 2013-02-15 ENCOUNTER — Telehealth: Payer: Self-pay

## 2013-02-15 LAB — CBC WITH DIFFERENTIAL/PLATELET
Basophil %: 0.8 %
Eosinophil #: 0.1 10*3/uL (ref 0.0–0.7)
HCT: 37.2 % (ref 35.0–47.0)
HGB: 12.9 g/dL (ref 12.0–16.0)
Lymphocyte %: 25.4 %
MCH: 31.7 pg (ref 26.0–34.0)
MCHC: 34.5 g/dL (ref 32.0–36.0)
MCV: 92 fL (ref 80–100)
Monocyte #: 1 x10 3/mm — ABNORMAL HIGH (ref 0.2–0.9)
RBC: 4.05 10*6/uL (ref 3.80–5.20)

## 2013-02-15 LAB — BASIC METABOLIC PANEL
Anion Gap: 9 (ref 7–16)
BUN: 25 mg/dL — ABNORMAL HIGH (ref 7–18)
Chloride: 103 mmol/L (ref 98–107)
Co2: 28 mmol/L (ref 21–32)
EGFR (African American): 28 — ABNORMAL LOW
Glucose: 103 mg/dL — ABNORMAL HIGH (ref 65–99)
Osmolality: 284 (ref 275–301)
Potassium: 2.8 mmol/L — ABNORMAL LOW (ref 3.5–5.1)
Sodium: 140 mmol/L (ref 136–145)

## 2013-02-15 LAB — TSH: Thyroid Stimulating Horm: 2.67 u[IU]/mL

## 2013-02-15 MED ORDER — "INSULIN SYRINGE-NEEDLE U-100 30G X 1/2"" 0.5 ML MISC": Status: AC

## 2013-02-15 MED ORDER — INSULIN NPH (HUMAN) (ISOPHANE) 100 UNIT/ML ~~LOC~~ SUSP: SUBCUTANEOUS | Status: DC

## 2013-02-15 NOTE — Telephone Encounter (Signed)
I'm glad she is feeling better - please send for notes when ready  Can refill insulin now if he prefers or we can wait until we hear from Rob Thanks for the heads up

## 2013-02-15 NOTE — Telephone Encounter (Signed)
Dom pts son left v/m pt was admitted to [facility] on 02/14/13 with BS 990; this AM BS 180.pt feeling much better this AM. Dom said after opens insulin bottle after 3 weeks insulin loses potency; insulin pt was receiving had lost potency. Pt is taking Humulin N U 100. Novolin 70/30 is on med list. Rob from [facility] will send refill request for Humulin insulin.

## 2013-02-15 NOTE — Telephone Encounter (Signed)
Thanks - they can call if any changes  I would like to see her in follow up about 2 weeks after d/c

## 2013-02-15 NOTE — Telephone Encounter (Signed)
Spoke with Dom and pt is getting Humulin N 100 units on a sliding scale, pt usually gets between 4-7 units prn. Med refilled and pt's son notified  D/C summary not available because they decided to keep pt over night and she may be released tomorrow if she is still stable

## 2013-02-15 NOTE — Telephone Encounter (Signed)
Dom left v/m that pt is coming home on 02/16/13 and pt does not have any insulin at home. Pt request insulin to be sent to [facility] and [facility] request cb. Rob at [facility] pt last got Humulin N U 100 in March of 2014 was prescribed Dr Elpidio Anis with instructions 7 units twice a day (? Hospitalist);Rob has never filled Novolog 70/30.Please advise.

## 2013-02-19 ENCOUNTER — Encounter: Payer: Self-pay | Admitting: Family Medicine

## 2013-02-19 ENCOUNTER — Ambulatory Visit (INDEPENDENT_AMBULATORY_CARE_PROVIDER_SITE_OTHER): Payer: Medicare Other | Admitting: Family Medicine

## 2013-02-19 VITALS — BP 134/72 | HR 74 | Temp 97.6°F | Ht <= 58 in | Wt 133.8 lb

## 2013-02-19 DIAGNOSIS — E039 Hypothyroidism, unspecified: Secondary | ICD-10-CM

## 2013-02-19 DIAGNOSIS — N19 Unspecified kidney failure: Secondary | ICD-10-CM

## 2013-02-19 DIAGNOSIS — E119 Type 2 diabetes mellitus without complications: Secondary | ICD-10-CM

## 2013-02-19 DIAGNOSIS — I10 Essential (primary) hypertension: Secondary | ICD-10-CM

## 2013-02-19 MED ORDER — INSULIN DETEMIR 100 UNIT/ML ~~LOC~~ SOLN: 12.0000 [IU] | Freq: Every day | SUBCUTANEOUS | Status: DC

## 2013-02-19 MED ORDER — INSULIN NPH (HUMAN) (ISOPHANE) 100 UNIT/ML ~~LOC~~ SUSP: SUBCUTANEOUS | Status: DC

## 2013-02-19 NOTE — Assessment & Plan Note (Signed)
BP: 134/72 mmHg  bp in fair control at this time  No changes needed Disc lifstyle change with low sodium diet and exercise

## 2013-02-19 NOTE — Assessment & Plan Note (Signed)
In hosp when dehydrated cr was 2.67 Baseline is 1.6  Pt does not wish to see nephrologist at this time Re check with lytes today - since hydration status is much improved

## 2013-02-19 NOTE — Progress Notes (Signed)
Subjective:    Patient ID: Sara Lang, female    DOB: 1920/11/15, 79 y.o.   MRN: [id number]  HPI Here for f/u of hosp at [facility] 12/25-12/27 Admitted for very high blood sugar -monitored in ICU ? uti -no growth on cx however- tx with cipro (had indwelling cath and this was for prev)- her son had that filled but has not given it (will not unless she has symptoms)   No symptoms of a uti thus far   Sugar was 990 at admit  Given IV insulin and lytes replaced  Per son -her sugars were running "normal" prior - and he only gives insulin if she needs it -- highest was 220   Weight is up 19 lb - had a lot of fluids in the hospital   Needs re check of lytes and magnesium  Renal failure worsened with cr of 2.67 then coming down to 1.8- baseline is 1.6   Placed on sliding scale of humulin n with meals and also insulin delemir 10 u daily  Of note she has symptoms of hypoglycemia if sugar goes below 160  A1C was up to 12.5 in hosp (was 7.5 here last check) Pt states her glucometer is new and seems to be accurate  He found out that her short acting insulin - was not as effective due to age (she was not using enough of it )- he will need to refill it more   Since her hospitalization - has changed diet significantly -- ( her son has been to DM ed)  Coffee in am a little bit of 1/2 and 1/2  2 small snacks per day - like granola bar or no sugar added fruit  Oatmeal at 10 am  Lunch one slice of bread/ a green vegetable  Eve meal- fish/ meat/ salad and fruit  All portions mirror what she had in the hospital  (1800 cal diet)  No juice/ avoiding sweets and bananas (sugary fruits)    Note: prior to her hosp- was eating much too much - for example a box of cereal (hides it)  She was stealing son's junk food and bananas and eating them secretly  Limited in terms of activity - due to chronic pain and mobility issues   Sugars at home 200s-300s primarily She cannot have ace/ [facility]B Eye exam   bp is  stable today  No cp or palpitations or headaches or edema  No side effects to medicines  BP Readings from Last 3 Encounters:  02/19/13 134/72  11/02/12 132/72  12/06/11 128/74     Hypothyroid- tsh [facility] therapeutic in hospital   She feels good now  Not craving sweets as much  Still urinates frequent - less thirsty  Mental status is back to her baseline (some short term memory loss)   Her son dec her raniditine to 1 pill daily (no gerd symptoms at all)  Uses her antivert -as needed (usually one a day)  Patient Active Problem List   Diagnosis Date Noted  . At risk for falling 12/06/2011  . HYPERKALEMIA 04/23/2010  . ADVERSE DRUG REACTION 03/27/2009  . GOUT 09/25/2008  . OTHER CHRONIC PAIN 08/14/2008  . VITAMIN B12 DEFICIENCY 07/02/2008  . UNSPECIFIED VITAMIN D DEFICIENCY 07/02/2008  . MEMORY LOSS 07/02/2008  . FATIGUE 06/25/2008  . BACK PAIN, CHRONIC 08/04/2006  . FOOT PAIN, CHRONIC 08/04/2006  . OSTEOPENIA 08/04/2006  . HYPOTHYROIDISM 08/02/2006  . DIABETES MELLITUS, TYPE II 08/02/2006  . HYPERCHOLESTEROLEMIA 08/02/2006  .  SYNDROME, CHRONIC PAIN 08/02/2006  . CARPAL TUNNEL SYNDROME 08/02/2006  . NEUROPATHY 08/02/2006  . HYPERTENSION 08/02/2006  . GERD 08/02/2006  . PEPTIC ULCER DISEASE 08/02/2006  . RENAL FAILURE 08/02/2006  . OSTEOARTHRITIS 08/02/2006  . JAUNDICE 08/02/2006  . PEDAL EDEMA 08/02/2006   Past Medical History  Diagnosis Date  . Unspecified adverse effect of unspecified drug, medicinal and biological substance   . Backache, unspecified   . Carpal tunnel syndrome   . Type II or unspecified type diabetes mellitus without mention of complication, not stated as uncontrolled   . Other malaise and fatigue   . Pain in limb   . Esophageal reflux   . Gout, unspecified   . Pure hypercholesterolemia   . Hyperpotassemia   . Unspecified essential hypertension   . Unspecified hypothyroidism   . Jaundice, unspecified, not of newborn   . Memory loss   .  Mononeuritis of unspecified site   . Osteoarthrosis, unspecified whether generalized or localized, unspecified site   . Disorder of bone and cartilage, unspecified   . Other chronic pain   . Edema   . Peptic ulcer, unspecified site, unspecified as acute or chronic, without mention of hemorrhage, perforation, or obstruction   . Renal failure, unspecified     secondary to Vioxx  . Chronic pain syndrome   . Unspecified vitamin D deficiency   . Other B-complex deficiencies   . Abnormal pancreatic function study     elevated enzymes  . History of transfusion of whole blood    Past Surgical History  Procedure Laterality Date  . Appendectomy  1928  . Cataract extraction    . Cholecystectomy  1956  . Thyroidectomy      throat surgery-cold nodule  . Tonsillectomy    . Toe amputation      x2 Hammer toes  . Toe nail procedure    . Esophagogastroduodenoscopy      stricture   History  Substance Use Topics  . Smoking status: Never Smoker   . Smokeless tobacco: Not on file  . Alcohol Use: No   Family History  Problem Relation Age of Onset  . Hyperlipidemia Father   . Transient ischemic attack Son   . Depression Son   . Arthritis Mother   . Arthritis Father   . Arthritis      Grandparents   Allergies  Allergen Reactions  . Ace Inhibitors     REACTION: ace inhibitors cause inc K-  . Calcium     REACTION: nausea  . Donepezil Hydrochloride     REACTION: MS change  . Furosemide     REACTION: reaction not known  . Latex Itching    Red skin and itching  . Memantine     REACTION: MS change  . Oxycodone-Aspirin     REACTION: reaction not known  . Penicillins     REACTION: reaction not known  . Pregabalin     REACTION: sick  . Rofecoxib     REACTION: renal failure  . Sulfonamide Derivatives     REACTION: reaction not known   Current Outpatient Prescriptions on File Prior to Visit  Medication Sig Dispense Refill  . amLODipine (NORVASC) 5 MG tablet Take 1 tablet (5 mg  total) by mouth every 12 (twelve) hours.  60 tablet  5  . furosemide (LASIX) 40 MG tablet TAKE ONE (1) TABLET BY MOUTH EVERY DAY  30 tablet  5  . Insulin Syringe-Needle U-100 (B-D INS SYR ULTRAFINE .5CC/30G) 30G X 1/2"  0.5 ML MISC Use as directed  100 each  0  . levothyroxine (SYNTHROID, LEVOTHROID) 75 MCG tablet Take 1 tablet (75 mcg total) by mouth daily.  30 tablet  5  . lidocaine (LIDODERM) 5 % Place 1 patch onto the skin daily. Remove & Discard patch within 12 hours or as directed by MD       . meclizine (ANTIVERT) 32 MG tablet Take 32 mg by mouth as needed.      Marland Kitchen omeprazole (PRILOSEC) 20 MG capsule TAKE ONE CAPSULE BY MOUTH DAILY  30 capsule  5  . ranitidine (ZANTAC) 150 MG tablet TAKE ONE (1) TABLET BY MOUTH TWO (2) TIMES DAILY  60 tablet  5  . [DISCONTINUED] esomeprazole (NEXIUM) 40 MG capsule Take 1 capsule (40 mg total) by mouth 2 (two) times daily.  60 capsule  11   No current facility-administered medications on file prior to visit.     Review of Systems Review of Systems  Constitutional: Negative for fever, appetite change, fatigue and unexpected weight change.  Eyes: Negative for pain and visual disturbance.  Respiratory: Negative for cough and shortness of breath.   Cardiovascular: Negative for cp or palpitations    Gastrointestinal: Negative for nausea, diarrhea and constipation.  Genitourinary: Negative for urgency and frequency. neg for dysuria neg for hematuria  Skin: Negative for pallor or rash   MSK pos for poor mobility and mod to severe arthritis aches and pains  Neurological: Negative for weakness, light-headedness, numbness and headaches.  Hematological: Negative for adenopathy. Does not bruise/bleed easily.  Psychiatric/Behavioral: Negative for dysphoric mood. The patient is at times  nervous/anxious.         Objective:   Physical Exam  Constitutional: She appears well-developed and well-nourished. No distress.  Well appearing elderly female/ frail appearing    HENT:  Head: Normocephalic and atraumatic.  Mouth/Throat: Oropharynx is clear and moist.  Eyes: Conjunctivae and EOM are normal. Pupils are equal, round, and reactive to light. Right eye exhibits no discharge. Left eye exhibits no discharge. No scleral icterus.  Neck: Normal range of motion. Neck supple. No JVD present. Carotid bruit is not present. No thyromegaly present.  Cardiovascular: Normal rate, regular rhythm and intact distal pulses.   Pulmonary/Chest: Effort normal and breath sounds normal. No respiratory distress. She has no wheezes. She has no rales.  No crackles  Pt has pursed lip breathing - baseline (worse when anxious)  Abdominal: Soft. Bowel sounds are normal. She exhibits no distension, no abdominal bruit and no mass. There is no tenderness.  No cva tenderness   Musculoskeletal: She exhibits edema and tenderness.  Trace to one plus pedal edema   Joints in hands and feet are tender  Deformities in toes are baseline -with crossover and callus formation   Lymphadenopathy:    She has no cervical adenopathy.  Neurological: She is alert. She has normal reflexes.  Skin: Skin is warm and dry. No rash noted. No erythema. No pallor.  Psychiatric: She has a normal mood and affect.          Assessment & Plan:

## 2013-02-19 NOTE — Assessment & Plan Note (Signed)
tsh was therapeutic in hospital  No clinica changes except wt ain  Will continue to follow

## 2013-02-19 NOTE — Patient Instructions (Signed)
Labs today  Follow up in 3 months with labs prior for diabetes  Make sure to schedule an eye exam  Increase the levemir (long acting insulin) to 12 units per day  Continue sliding scale - check sugar 3-4 times per day Stick with a diabetic diet  Watch for signs of urinary tract infection

## 2013-02-19 NOTE — Assessment & Plan Note (Signed)
S/p recent hosp for DKA Hosp records and studies rev in detail with pt and her son  Rev 2 d of blood sugars on 10 u of levimir and SS NPH- starting to come down  Big diet change noted - and disc in detail (they have had DM teaching) Plan today to inc levimir to 12 u daily and continue SS insulin with top dose of 10 u - checking sugar 3- times daily  Control will be tricky since pt has hypoglycemic symptoms with sugar less than 160 Enc opthy exam Cannot take ace  a1c and f/u in 3 mo

## 2013-02-19 NOTE — Telephone Encounter (Signed)
Pt's son schedule f/u appt for today

## 2013-02-20 LAB — RENAL FUNCTION PANEL
BUN: 19 mg/dL (ref 6–23)
CO2: 26 mEq/L (ref 19–32)
Calcium: 8.9 mg/dL (ref 8.4–10.5)
Creatinine, Ser: 1.7 mg/dL — ABNORMAL HIGH (ref 0.4–1.2)
GFR: 30.4 mL/min — ABNORMAL LOW (ref >60.00)
Glucose, Bld: 158 mg/dL — ABNORMAL HIGH (ref 70–99)
Sodium: 136 mEq/L (ref 135–145)

## 2013-02-22 ENCOUNTER — Encounter: Payer: Self-pay | Admitting: *Deleted

## 2013-02-27 ENCOUNTER — Telehealth: Payer: Self-pay

## 2013-02-27 DIAGNOSIS — E119 Type 2 diabetes mellitus without complications: Secondary | ICD-10-CM

## 2013-02-27 NOTE — Telephone Encounter (Signed)
pts son left v/m; pts blood sugar going up and down; Dom thinks long acting insulin given in hospital may be causing problems. FBS today was 94 after pt ate BS was 170; Dom did not give pt morning insulin. Novolog given at 2pm  8 units and BS at 2pm was 344. At 4 Pm BS was 200. 4pm gave pt 10 units of long acting insulin,Levemir. Dom wants Dr Royden Purlower's advice about dosing insulin. Pt is lucid and walking normally. Dom is watching pts diet carefully. Dom wants to know more about nurse at our office that can talk with Dom about pts diabetes (Dom said discussed at last visit with Dr Milinda Antisower). Dom  Said CB on 02/28/13 would be OK.

## 2013-02-28 NOTE — Telephone Encounter (Signed)
Most of those sugars are high rather than low - and 94 is fine, however I know that she feels hypoglycemic if she gets under 160--- so this is quite tricky We have and endocrinologist here- Dr Lafe GarinGherge -- she is here on wed ams and I would love to get them an appt with her - let me know if agreeable and I will do the referral  If he is worried about the low sugar - cut the long acting insulin by 3 units and keep me posted (if her sugars stay low in ams then a bedtime snack is a good idea

## 2013-02-28 NOTE — Telephone Encounter (Signed)
[name] advised.  He would like the patient to have an appt with Dr. Lafe GarinGherge, please do referral.

## 2013-02-28 NOTE — Telephone Encounter (Signed)
Ref to endo 

## 2013-03-08 ENCOUNTER — Ambulatory Visit: Payer: Medicare Other | Admitting: Internal Medicine

## 2013-03-12 ENCOUNTER — Other Ambulatory Visit: Payer: Self-pay | Admitting: Family Medicine

## 2013-03-17 DIAGNOSIS — E039 Hypothyroidism, unspecified: Secondary | ICD-10-CM

## 2013-03-17 DIAGNOSIS — N19 Unspecified kidney failure: Secondary | ICD-10-CM

## 2013-03-17 DIAGNOSIS — E119 Type 2 diabetes mellitus without complications: Secondary | ICD-10-CM

## 2013-05-01 ENCOUNTER — Ambulatory Visit: Payer: Medicare Other | Admitting: Internal Medicine

## 2013-05-09 ENCOUNTER — Other Ambulatory Visit: Payer: Self-pay | Admitting: Family Medicine

## 2013-05-13 ENCOUNTER — Other Ambulatory Visit (INDEPENDENT_AMBULATORY_CARE_PROVIDER_SITE_OTHER): Payer: Medicare Other

## 2013-05-13 DIAGNOSIS — E875 Hyperkalemia: Secondary | ICD-10-CM

## 2013-05-13 DIAGNOSIS — E119 Type 2 diabetes mellitus without complications: Secondary | ICD-10-CM

## 2013-05-13 LAB — RENAL FUNCTION PANEL
Albumin: 4 g/dL (ref 3.5–5.2)
BUN: 22 mg/dL (ref 6–23)
CALCIUM: 9.2 mg/dL (ref 8.4–10.5)
CO2: 30 mEq/L (ref 19–32)
Chloride: 103 mEq/L (ref 96–112)
Creatinine, Ser: 1.5 mg/dL — ABNORMAL HIGH (ref 0.4–1.2)
GFR: 34.89 mL/min — ABNORMAL LOW (ref >60.00)
GLUCOSE: 135 mg/dL — AB (ref 70–99)
PHOSPHORUS: 3.1 mg/dL (ref 2.3–4.6)
POTASSIUM: 3.2 meq/L — AB (ref 3.5–5.1)
Sodium: 142 mEq/L (ref 135–145)

## 2013-05-13 LAB — HEMOGLOBIN A1C: HEMOGLOBIN A1C: 7.3 % — AB (ref 4.6–6.5)

## 2013-05-20 ENCOUNTER — Encounter: Payer: Self-pay | Admitting: Family Medicine

## 2013-05-20 ENCOUNTER — Ambulatory Visit (INDEPENDENT_AMBULATORY_CARE_PROVIDER_SITE_OTHER): Payer: Medicare Other | Admitting: Family Medicine

## 2013-05-20 VITALS — BP 122/64 | HR 64 | Temp 97.4°F | Ht <= 58 in | Wt 126.8 lb

## 2013-05-20 DIAGNOSIS — I10 Essential (primary) hypertension: Secondary | ICD-10-CM

## 2013-05-20 DIAGNOSIS — N19 Unspecified kidney failure: Secondary | ICD-10-CM

## 2013-05-20 DIAGNOSIS — E876 Hypokalemia: Secondary | ICD-10-CM

## 2013-05-20 DIAGNOSIS — E119 Type 2 diabetes mellitus without complications: Secondary | ICD-10-CM

## 2013-05-20 MED ORDER — POTASSIUM CHLORIDE CRYS ER 10 MEQ PO TBCR: 10.0000 meq | EXTENDED_RELEASE_TABLET | Freq: Every day | ORAL | Status: DC

## 2013-05-20 NOTE — Progress Notes (Signed)
Subjective:    Patient ID: Sara Lang, female    DOB: 02/09/1921, 80 y.o.   MRN: [id number]  HPI Here for f/u of chronic medical problems   Feels fair overall  Chronic pain is still her biggest complaint   bp is stable today  No cp or palpitations or headaches or edema  No side effects to medicines  BP Readings from Last 3 Encounters:  05/20/13 122/64  02/19/13 134/72  11/02/12 132/72     Wt is down 7 lb  Is eating better --son is making a big effort with her --- he is looking at menus on the web - and that has been a good experiment  She tends to snack (sneaky) in between meals -that is her biggest problem Eating more salmon/ fish /controlled carbohydrates  bmi of 29   K is lower  Lab Results  Component Value Date   K 3.2* 05/13/2013    DM  Lab Results  Component Value Date   HGBA1C 7.3* 05/13/2013    Still feels bad if her sugar runs under 160 or so  Son has to be careful about her short acting insulin  On a sliding scale  She in general does better with her levemir- son titrates 10- 12 units once per day   Son watches her all the time - and he makes short daily trips to the store     Chemistry      Component Value Date/Time   NA 142 05/13/2013 0911   K 3.2* 05/13/2013 0911   CL 103 05/13/2013 0911   CO2 30 05/13/2013 0911   BUN 22 05/13/2013 0911   CREATININE 1.5* 05/13/2013 0911      Component Value Date/Time   CALCIUM 9.2 05/13/2013 0911   ALKPHOS 81 11/02/2012 1102   AST 15 11/02/2012 1102   ALT 10 11/02/2012 1102   BILITOT 0.8 11/02/2012 1102     excellent Cr - this is down from baseline   Patient Active Problem List   Diagnosis Date Noted  . At risk for falling 12/06/2011  . HYPERKALEMIA 04/23/2010  . ADVERSE DRUG REACTION 03/27/2009  . GOUT 09/25/2008  . OTHER CHRONIC PAIN 08/14/2008  . VITAMIN B12 DEFICIENCY 07/02/2008  . UNSPECIFIED VITAMIN D DEFICIENCY 07/02/2008  . MEMORY LOSS 07/02/2008  . FATIGUE 06/25/2008  . BACK PAIN, CHRONIC 08/04/2006   . FOOT PAIN, CHRONIC 08/04/2006  . OSTEOPENIA 08/04/2006  . HYPOTHYROIDISM 08/02/2006  . DIABETES MELLITUS, TYPE II 08/02/2006  . HYPERCHOLESTEROLEMIA 08/02/2006  . SYNDROME, CHRONIC PAIN 08/02/2006  . CARPAL TUNNEL SYNDROME 08/02/2006  . NEUROPATHY 08/02/2006  . HYPERTENSION 08/02/2006  . GERD 08/02/2006  . PEPTIC ULCER DISEASE 08/02/2006  . RENAL FAILURE 08/02/2006  . OSTEOARTHRITIS 08/02/2006  . JAUNDICE 08/02/2006  . PEDAL EDEMA 08/02/2006   Past Medical History  Diagnosis Date  . Unspecified adverse effect of unspecified drug, medicinal and biological substance   . Backache, unspecified   . Carpal tunnel syndrome   . Type II or unspecified type diabetes mellitus without mention of complication, not stated as uncontrolled   . Other malaise and fatigue   . Pain in limb   . Esophageal reflux   . Gout, unspecified   . Pure hypercholesterolemia   . Hyperpotassemia   . Unspecified essential hypertension   . Unspecified hypothyroidism   . Jaundice, unspecified, not of newborn   . Memory loss   . Mononeuritis of unspecified site   . Osteoarthrosis, unspecified whether generalized or  localized, unspecified site   . Disorder of bone and cartilage, unspecified   . Other chronic pain   . Edema   . Peptic ulcer, unspecified site, unspecified as acute or chronic, without mention of hemorrhage, perforation, or obstruction   . Renal failure, unspecified     secondary to Vioxx  . Chronic pain syndrome   . Unspecified vitamin D deficiency   . Other B-complex deficiencies   . Abnormal pancreatic function study     elevated enzymes  . History of transfusion of whole blood    Past Surgical History  Procedure Laterality Date  . Appendectomy  1928  . Cataract extraction    . Cholecystectomy  1956  . Thyroidectomy      throat surgery-cold nodule  . Tonsillectomy    . Toe amputation      x2 Hammer toes  . Toe nail procedure    . Esophagogastroduodenoscopy      stricture    History  Substance Use Topics  . Smoking status: Never Smoker   . Smokeless tobacco: Not on file  . Alcohol Use: No   Family History  Problem Relation Age of Onset  . Hyperlipidemia Father   . Transient ischemic attack Son   . Depression Son   . Arthritis Mother   . Arthritis Father   . Arthritis      Grandparents   Allergies  Allergen Reactions  . Ace Inhibitors     REACTION: ace inhibitors cause inc K-  . Calcium     REACTION: nausea  . Donepezil Hydrochloride     REACTION: MS change  . Furosemide     REACTION: reaction not known  . Latex Itching    Red skin and itching  . Memantine     REACTION: MS change  . Oxycodone-Aspirin     REACTION: reaction not known  . Penicillins     REACTION: reaction not known  . Pregabalin     REACTION: sick  . Rofecoxib     REACTION: renal failure  . Sulfonamide Derivatives     REACTION: reaction not known   Current Outpatient Prescriptions on File Prior to Visit  Medication Sig Dispense Refill  . amLODipine (NORVASC) 5 MG tablet TAKE 1 TABLET BY MOUTH EVERY 12 HOURS  60 tablet  5  . furosemide (LASIX) 40 MG tablet TAKE ONE (1) TABLET BY MOUTH EVERY DAY  30 tablet  5  . insulin detemir (LEVEMIR) 100 UNIT/ML injection Inject 0.12 mLs (12 Units total) into the skin daily.  10 mL  11  . insulin NPH (HUMULIN N) 100 UNIT/ML injection Inject 4-7 units as needed for high blood sugar readings due to DM 250.0  5 mL  5  . Insulin Syringe-Needle U-100 (B-D INS SYR ULTRAFINE .5CC/30G) 30G X 1/2" 0.5 ML MISC Use as directed  100 each  0  . levothyroxine (SYNTHROID, LEVOTHROID) 75 MCG tablet TAKE 1 TABLET BY MOUTH DAILY  30 tablet  5  . lidocaine (LIDODERM) 5 % Place 1 patch onto the skin daily. Remove & Discard patch within 12 hours or as directed by MD       . meclizine (ANTIVERT) 32 MG tablet Take 32 mg by mouth as needed.      Marland Kitchen omeprazole (PRILOSEC) 20 MG capsule TAKE ONE CAPSULE BY MOUTH DAILY  30 capsule  5  . ranitidine (ZANTAC) 150  MG tablet TAKE ONE (1) TABLET BY MOUTH TWO (2) TIMES DAILY  60 tablet  5  . [  DISCONTINUED] esomeprazole (NEXIUM) 40 MG capsule Take 1 capsule (40 mg total) by mouth 2 (two) times daily.  60 capsule  11   No current facility-administered medications on file prior to visit.      Review of Systems Review of Systems  Constitutional: Negative for fever, appetite change, fatigue and unexpected weight change.  Eyes: Negative for pain and visual disturbance.  Respiratory: Negative for cough and shortness of breath.   Cardiovascular: Negative for cp or palpitations    Gastrointestinal: Negative for nausea, diarrhea and constipation.  Genitourinary: Negative for urgency and frequency. neg for dysuria or hematuria  Skin: Negative for pallor or rash   MSK pos for baseline joint pain -esp in hands and knees  Neurological: Negative for weakness, light-headedness, numbness and headaches.  Hematological: Negative for adenopathy. Does not bruise/bleed easily.  Psychiatric/Behavioral: Negative for dysphoric mood. The patient is not nervous/anxious.         Objective:   Physical Exam  Constitutional: She appears well-developed and well-nourished. No distress.  Frail appearing elderly female who is fairly mentally sharp today  HENT:  Head: Normocephalic and atraumatic.  Mouth/Throat: Oropharynx is clear and moist.  Eyes: Conjunctivae and EOM are normal. Pupils are equal, round, and reactive to light. Right eye exhibits no discharge. Left eye exhibits no discharge. No scleral icterus.  Neck: Normal range of motion. Neck supple. No JVD present. Carotid bruit is not present.  Cardiovascular: Normal rate, regular rhythm and intact distal pulses.  Exam reveals no gallop.   Pulmonary/Chest: Effort normal and breath sounds normal. No respiratory distress. She has no wheezes. She has no rales.  No crackles  Abdominal: Soft. Bowel sounds are normal. She exhibits no distension and no mass. There is no  tenderness.  Musculoskeletal: She exhibits no edema.  Baseline limited rom of knees and other joints due to OA  Lymphadenopathy:    She has no cervical adenopathy.  Neurological: She is alert. No cranial nerve deficit. She exhibits normal muscle tone. Coordination normal.  Skin: Skin is warm and dry. No rash noted. No erythema. No pallor.  Psychiatric: She has a normal mood and affect.  Pt is cheerful and resp to questions appropriately  She is fairly mentally sharp today  Baseline dementia           Assessment & Plan:

## 2013-05-20 NOTE — Assessment & Plan Note (Signed)
In elderly pt with HTN/ DM and dementia Rev labs- her cr is under baseline today Disc imp of mt hydration and also avoiding nephrotoxic meds

## 2013-05-20 NOTE — Assessment & Plan Note (Signed)
bp in fair control at this time  BP Readings from Last 1 Encounters:  05/20/13 122/64   No changes needed Disc lifstyle change with low sodium diet and exercise  Labs reviewed

## 2013-05-20 NOTE — Assessment & Plan Note (Signed)
Will replace cautiously in light of renal failure 10 meq ER KCL daily as tol Re check level in 1 mo  Urged to update if problems or side eff

## 2013-05-20 NOTE — Patient Instructions (Signed)
Start on the potassium once pill per day  Schedule labs in about a month for potassium level  Follow up with me for an annual exam in 6 months with labs prior  Continue watching diet  Labs are stable

## 2013-05-20 NOTE — Assessment & Plan Note (Signed)
Lab Results  Component Value Date   HGBA1C 7.3* 05/13/2013    This continues to improve  Hx of DKA within the year  Complex case-pt's son does use SSI and does alter basal insulin as needed She is symptomatic with glucose under 160  Also renal failure Diet better -stressed imp of minimizing snacking  Has upcoming appt with endocrinology

## 2013-05-20 NOTE — Progress Notes (Signed)
Pre visit review using our clinic review tool, if applicable. No additional management support is needed unless otherwise documented below in the visit note. 

## 2013-05-21 ENCOUNTER — Telehealth: Payer: Self-pay | Admitting: Family Medicine

## 2013-05-21 NOTE — Telephone Encounter (Signed)
Relevant patient education mailed to patient.  

## 2013-05-22 ENCOUNTER — Ambulatory Visit (INDEPENDENT_AMBULATORY_CARE_PROVIDER_SITE_OTHER): Payer: Medicare Other | Admitting: Internal Medicine

## 2013-05-22 ENCOUNTER — Encounter: Payer: Self-pay | Admitting: Internal Medicine

## 2013-05-22 VITALS — BP 118/64 | HR 63 | Temp 97.9°F | Resp 14 | Ht <= 58 in | Wt 126.8 lb

## 2013-05-22 DIAGNOSIS — E119 Type 2 diabetes mellitus without complications: Secondary | ICD-10-CM

## 2013-05-22 MED ORDER — INSULIN PEN NEEDLE 31G X 5 MM MISC: Status: DC

## 2013-05-22 MED ORDER — INSULIN LISPRO 100 UNIT/ML (KWIKPEN): PEN_INJECTOR | SUBCUTANEOUS | Status: AC

## 2013-05-22 NOTE — Patient Instructions (Addendum)
Please stop the Humulin N. Continue Levemir at 12 units, but move it at bedtime.  Start mealtime Humalog: - 4 units with a small meal - 5 units with a large meal. Inject Humalog sliding scale as follows: - 175-200: + 1 unit  - 201-225: + 2 units  - 226-250: + 3 units   - >250: + 4 units   Please return in 1 month with your sugar log.   PATIENT INSTRUCTIONS FOR TYPE 2 DIABETES:  **Please join MyChart!** - see attached instructions about how to join   DIET AND EXERCISE Diet and exercise is an important part of diabetic treatment.  We recommended aerobic exercise in the form of brisk walking (working between 40-60% of maximal aerobic capacity, similar to brisk walking) for 150 minutes per week (such as 30 minutes five days per week) along with 3 times per week performing 'resistance' training (using various gauge rubber tubes with handles) 5-10 exercises involving the major muscle groups (upper body, lower body and core) performing 10-15 repetitions (or near fatigue) each exercise. Start at half the above goal but build slowly to reach the above goals. If limited by weight, joint pain, or disability, we recommend daily walking in a swimming pool with water up to waist to reduce pressure from joints while allow for adequate exercise.    BLOOD GLUCOSES Monitoring your blood glucoses is important for continued management of your diabetes. Please check your blood glucoses 2-4 times a day: fasting, before meals and at bedtime (you can rotate these measurements - e.g. one day check before the 3 meals, the next day check before 2 of the meals and before bedtime, etc.   HYPOGLYCEMIA (low blood sugar) Hypoglycemia is usually a reaction to not eating, exercising, or taking too much insulin/ other diabetes drugs.  Symptoms include tremors, sweating, hunger, confusion, headache, etc. Treat IMMEDIATELY with 15 grams of Carbs:   4 glucose tablets    cup regular juice/soda   2 tablespoons raisins   4  teaspoons sugar   1 tablespoon honey Recheck blood glucose in 15 mins and repeat above if still symptomatic/blood glucose <100. Please contact our office at [phone] if you have questions about how to next handle your insulin.  RECOMMENDATIONS TO REDUCE YOUR RISK OF DIABETIC COMPLICATIONS: * Take your prescribed MEDICATION(S). * Follow a DIABETIC diet: Complex carbs, fiber rich foods, heart healthy fish twice weekly, (monounsaturated and polyunsaturated) fats * AVOID saturated/trans fats, high fat foods, >2,300 mg salt per day. * EXERCISE at least 5 times a week for 30 minutes or preferably daily.  * DO NOT SMOKE OR DRINK more than 1 drink a day. * Check your FEET every day. Do not wear tightfitting shoes. Contact us if you develop an ulcer * See your EYE doctor once a year or more if needed * Get a FLU shot once a year * Get a PNEUMONIA vaccine once before and once after age 78 years  GOALS:  * Your Hemoglobin A1c of <7%  * fasting sugars need to be <130 * after meals sugars need to be <180 (2h after you start eating) * Your Systolic BP should be 140 or lower  * Your Diastolic BP should be 80 or lower  * Your HDL (Good Cholesterol) should be 40 or higher  * Your LDL (Bad Cholesterol) should be 100 or lower  * Your Triglycerides should be 150 or lower  * Your Urine microalbumin (kidney function) should be <30 * Your Body Mass Index  should be 25 or lower   We will be glad to help you achieve these goals. Our telephone number is: [phone].

## 2013-05-22 NOTE — Progress Notes (Signed)
Patient ID: Sara Lang, female   DOB: January 18, 1921, 80 y.o.   MRN: [id number]  HPI: Sara Lang is a 80 y.o.-year-old female, referred by her PCP, Dr. Milinda Lang, for management of DM2, insulin-dependent, uncontrolled, with complications (PN, CKD). She is here with her son, who offers most of the hx.   Patient has been diagnosed with diabetes in 1980s; she started insulin 1 week after dx. Last hemoglobin A1c was: Lab Results  Component Value Date   HGBA1C 7.3* 05/13/2013   HGBA1C 7.5* 11/02/2012   HGBA1C 6.9* 12/06/2011   Pt is on a regimen of: - Levemir  10 (<200) -12 (>200) units once a day before lunch - Humulin N by SS! No Humulin if <150 b/c she develops hypoglycemia, >150: 4-7 units E.g. 200: 5 She was on Humulin 70/30 12 units - at last hospitalization 01/2014  Pt checks her sugars 2 a day and they are: - am: 250s - 2h after b'fast: n/c - before lunch: 150-250 - 2h after lunch: n/c - before dinner: 150-250 - 2h after dinner: n/c - bedtime: n/c No recent lows. Lowest sugar was 55; she has hypoglycemia awareness at 100  Highest sugar was 470s in last 3 mo.   Pt's meals are: - Breakfast: coffee, corn flakes - Lunch: fish/other meat, vegetables - Dinner: fish/other meat, vegetables - Snacks: 2-3: sugar free, fruits  - + CKD, last BUN/creatinine:  Lab Results  Component Value Date   BUN 22 05/13/2013   CREATININE 1.5* 05/13/2013  She stopped ACEI b/c increased K.  - last set of lipids: Lab Results  Component Value Date   CHOL 189 04/07/2010   HDL 34.40* 04/07/2010   LDLDIRECT 105.2 04/07/2010   TRIG 300.0* 04/07/2010   CHOLHDL 5 04/07/2010   - last eye exam was in 2013. No DR.  - + numbness and tingling in her feet.  Pt has no FH of DM.  ROS: Constitutional: + weight loss, + fatigue, + feeling cold, + nocturia Eyes: no blurry vision, no xerophthalmia ENT: no sore throat, no nodules palpated in throat, no dysphagia/odynophagia, no hoarseness, +  hypoacusis Cardiovascular: no CP/SOB/no palpitations/+ leg swelling Respiratory: no cough/SOB Gastrointestinal: + N/+ V/+ D/+ C, + heartburn Musculoskeletal: + muscle aches/+ joint aches Skin: no rashes, + easy bruising Neurological: no tremors/numbness/tingling/dizziness, + HA occas. Psychiatric: + both: depression/anxiety  Past Medical History  Diagnosis Date  . Unspecified adverse effect of unspecified drug, medicinal and biological substance   . Backache, unspecified   . Carpal tunnel syndrome   . Type II or unspecified type diabetes mellitus without mention of complication, not stated as uncontrolled   . Other malaise and fatigue   . Pain in limb   . Esophageal reflux   . Gout, unspecified   . Pure hypercholesterolemia   . Hyperpotassemia   . Unspecified essential hypertension   . Unspecified hypothyroidism   . Jaundice, unspecified, not of newborn   . Memory loss   . Mononeuritis of unspecified site   . Osteoarthrosis, unspecified whether generalized or localized, unspecified site   . Disorder of bone and cartilage, unspecified   . Other chronic pain   . Edema   . Peptic ulcer, unspecified site, unspecified as acute or chronic, without mention of hemorrhage, perforation, or obstruction   . Renal failure, unspecified     secondary to Vioxx  . Chronic pain syndrome   . Unspecified vitamin D deficiency   . Other B-complex deficiencies   . Abnormal pancreatic  function study     elevated enzymes  . History of transfusion of whole blood    Past Surgical History  Procedure Laterality Date  . Appendectomy  1928  . Cataract extraction    . Cholecystectomy  1956  . Thyroidectomy      throat surgery-cold nodule  . Tonsillectomy    . Toe amputation      x2 Hammer toes  . Toe nail procedure    . Esophagogastroduodenoscopy      stricture   History   Social History  . Marital Status: Widowed    Spouse Name: N/A    Number of Children: 3  . Years of Education: N/A   Lives with son, who is retired. He is administering her injections and checks her sugars.  Occupational History  . Retired    Social History Main Topics  . Smoking status: Never Smoker   . Smokeless tobacco: Not on file  . Alcohol Use: No  . Drug Use: No   Social History Narrative   Has 4 dogs      Lives with her son   Current Outpatient Prescriptions on File Prior to Visit  Medication Sig Dispense Refill  . amLODipine (NORVASC) 5 MG tablet TAKE 1 TABLET BY MOUTH EVERY 12 HOURS  60 tablet  5  . insulin detemir (LEVEMIR) 100 UNIT/ML injection Inject 0.12 mLs (12 Units total) into the skin daily.  10 mL  11  . insulin NPH (HUMULIN N) 100 UNIT/ML injection Inject 4-7 units as needed for high blood sugar readings due to DM 250.0  5 mL  5  . Insulin Syringe-Needle U-100 (B-D INS SYR ULTRAFINE .5CC/30G) 30G X 1/2" 0.5 ML MISC Use as directed  100 each  0  . levothyroxine (SYNTHROID, LEVOTHROID) 75 MCG tablet TAKE 1 TABLET BY MOUTH DAILY  30 tablet  5  . lidocaine (LIDODERM) 5 % Place 1 patch onto the skin daily. Remove & Discard patch within 12 hours or as directed by MD       . meclizine (ANTIVERT) 32 MG tablet Take 32 mg by mouth as needed.      Marland Kitchen. omeprazole (PRILOSEC) 20 MG capsule TAKE ONE CAPSULE BY MOUTH DAILY  30 capsule  5  . potassium chloride (K-DUR,KLOR-CON) 10 MEQ tablet Take 1 tablet (10 mEq total) by mouth daily. Take with a meal  30 tablet  11  . [DISCONTINUED] esomeprazole (NEXIUM) 40 MG capsule Take 1 capsule (40 mg total) by mouth 2 (two) times daily.  60 capsule  11   No current facility-administered medications on file prior to visit.   Allergies  Allergen Reactions  . Ace Inhibitors     REACTION: ace inhibitors cause inc K-  . Calcium     REACTION: nausea  . Donepezil Hydrochloride     REACTION: MS change  . Furosemide     REACTION: reaction not known  . Latex Itching    Red skin and itching  . Memantine     REACTION: MS change  . Oxycodone-Aspirin      REACTION: reaction not known  . Penicillins     REACTION: reaction not known  . Pregabalin     REACTION: sick  . Rofecoxib     REACTION: renal failure  . Sulfonamide Derivatives     REACTION: reaction not known   Family History  Problem Relation Age of Onset  . Hyperlipidemia Father   . Transient ischemic attack Son   . Depression Son   .  Arthritis Mother   . Arthritis Father   . Arthritis      Grandparents    PE: BP 118/64  Pulse 63  Temp(Src) 97.9 F (36.6 C) (Oral)  Resp 14  Ht 4\' 10"  (1.473 m)  Wt 126 lb 12.8 oz (57.516 kg)  BMI 26.51 kg/m2  SpO2 98% Wt Readings from Last 3 Encounters:  05/22/13 126 lb 12.8 oz (57.516 kg)  05/20/13 126 lb 12.8 oz (57.516 kg)  02/19/13 133 lb 12 oz (60.669 kg)   Constitutional: normal weight, in NAD Eyes: PERRLA, EOMI, no exophthalmos ENT: moist mucous membranes, no thyromegaly, no cervical lymphadenopathy Cardiovascular: RRR, No MRG, + bilat pitting edema up to knees Respiratory: CTA B Gastrointestinal: abdomen soft, NT, ND, BS+ Musculoskeletal:+ hand deformities 2/2 OA, strength intact in all 4 Skin: moist, warm, no rashes Neurological: no tremor with outstretched hands, DTR normal in all 4  ASSESSMENT: 1. DM2, insulin-dependent, uncontrolled, with complications - PN - CKD  PLAN:  1. Patient with long-standing, recently controlled diabetes, on one long acting insulin and one intermediate acting insulin - We discussed about options for treatment, and I suggested to:  Patient Instructions  Please stop the Humulin N. Continue Levemir at 12 units, but move it at bedtime.  Start mealtime Humalog: - 4 units with a small meal - 5 units with a large meal. Inject Humalog sliding scale as follows: - 175-200: + 1 unit  - 201-225: + 2 units  - 226-250: + 3 units   - >250: + 4 units  Please return in 1 month with your sugar log.  - discussed correct insulin adm - demonstrated pen use - sent Humalog pens to pharmacy -  continue checking sugars at different times of the day - check 2 times a day, rotating checks - given sugar log and advised how to fill it and to bring it at next appt  - given foot care handout and explained the principles  - given instructions for hypoglycemia management "15-15 rule"  - advised for yearly eye exams - Return to clinic in 1 mo with sugar log

## 2013-06-13 ENCOUNTER — Telehealth: Payer: Self-pay

## 2013-06-13 NOTE — Telephone Encounter (Signed)
Relevant patient education mailed to patient.  

## 2013-06-20 ENCOUNTER — Other Ambulatory Visit: Payer: Medicare Other

## 2013-06-24 ENCOUNTER — Other Ambulatory Visit (INDEPENDENT_AMBULATORY_CARE_PROVIDER_SITE_OTHER): Payer: Medicare Other

## 2013-06-24 DIAGNOSIS — E876 Hypokalemia: Secondary | ICD-10-CM

## 2013-06-24 LAB — POTASSIUM: POTASSIUM: 3.7 meq/L (ref 3.5–5.1)

## 2013-06-25 ENCOUNTER — Encounter: Payer: Self-pay | Admitting: Family Medicine

## 2013-07-08 ENCOUNTER — Other Ambulatory Visit: Payer: Self-pay | Admitting: Family Medicine

## 2013-09-11 ENCOUNTER — Other Ambulatory Visit: Payer: Self-pay | Admitting: Family Medicine

## 2013-09-25 ENCOUNTER — Telehealth: Payer: Self-pay | Admitting: Family Medicine

## 2013-09-25 NOTE — Telephone Encounter (Signed)
Son dropped off handicap placard form On shapale desk

## 2013-09-25 NOTE — Telephone Encounter (Signed)
Mailed back to pt per pt request.

## 2013-09-25 NOTE — Telephone Encounter (Signed)
Done and in IN box 

## 2013-09-25 NOTE — Telephone Encounter (Signed)
Form placed in your inbox

## 2013-11-11 ENCOUNTER — Telehealth: Payer: Self-pay | Admitting: Family Medicine

## 2013-11-11 DIAGNOSIS — M949 Disorder of cartilage, unspecified: Secondary | ICD-10-CM

## 2013-11-11 DIAGNOSIS — I10 Essential (primary) hypertension: Secondary | ICD-10-CM

## 2013-11-11 DIAGNOSIS — E039 Hypothyroidism, unspecified: Secondary | ICD-10-CM

## 2013-11-11 DIAGNOSIS — E119 Type 2 diabetes mellitus without complications: Secondary | ICD-10-CM

## 2013-11-11 DIAGNOSIS — E559 Vitamin D deficiency, unspecified: Secondary | ICD-10-CM

## 2013-11-11 DIAGNOSIS — E7800 Pure hypercholesterolemia, unspecified: Secondary | ICD-10-CM

## 2013-11-11 DIAGNOSIS — E538 Deficiency of other specified B group vitamins: Secondary | ICD-10-CM

## 2013-11-11 DIAGNOSIS — M899 Disorder of bone, unspecified: Secondary | ICD-10-CM

## 2013-11-11 NOTE — Telephone Encounter (Signed)
Message copied by Judy Pimple on Mon Nov 11, 2013  9:38 PM ------      Message from: Alvina Chou      Created: Thu Nov 07, 2013  2:50 PM      Regarding: Lab orders for Tuesday, 9.22.15       Patient is scheduled for CPX labs, please order future labs, Thanks , Terri       ------

## 2013-11-12 ENCOUNTER — Other Ambulatory Visit: Payer: Medicare Other

## 2013-11-19 ENCOUNTER — Encounter: Payer: Medicare Other | Admitting: Family Medicine

## 2013-11-21 ENCOUNTER — Other Ambulatory Visit: Payer: Self-pay | Admitting: Family Medicine

## 2013-12-23 ENCOUNTER — Other Ambulatory Visit: Payer: Self-pay | Admitting: Family Medicine

## 2014-01-21 ENCOUNTER — Other Ambulatory Visit: Payer: Self-pay | Admitting: Family Medicine

## 2014-04-14 ENCOUNTER — Telehealth: Payer: Self-pay

## 2014-04-14 MED ORDER — RIVASTIGMINE 4.6 MG/24HR TD PT24: 4.6000 mg | MEDICATED_PATCH | Freq: Every day | TRANSDERMAL | Status: DC

## 2014-04-14 NOTE — Telephone Encounter (Signed)
Dom pts son left v/m; pt has alzheimers and pt's anxiety level is very high;pts son request rx of exelon patch to [facility]. Pt has appt scheduled for CPX 04/25/2014. Dom request cb.

## 2014-04-14 NOTE — Telephone Encounter (Signed)
Watch for side eff of this including nausea and dizziness (keep me posted) or mental status change   This will not restore lost memory- but may slow down progression of dementia  Then we can see how she is doing with it at her f/u   Will send electronically

## 2014-04-14 NOTE — Telephone Encounter (Signed)
Spoken to patient's son. Notified him of Dr Royden Purlower's comments. Inform med send to [facility].

## 2014-04-15 NOTE — Telephone Encounter (Signed)
I start with the lowest dosage to see how the pt tolerates it and then we raise it from there

## 2014-04-15 NOTE — Telephone Encounter (Addendum)
Sara PraderDominique pt son left v/m requesting cb about exelon patch that was sent to pharmacy. After discussing with the pharmacist Sara Lang thinks dosage is too low and request cb.Please advise.[facility]

## 2014-04-15 NOTE — Telephone Encounter (Signed)
Called and spoken with patient's son. Notified him of Dr Royden Purlower's comments. Patient's son verbalized understanding.

## 2014-04-18 ENCOUNTER — Other Ambulatory Visit (INDEPENDENT_AMBULATORY_CARE_PROVIDER_SITE_OTHER): Payer: Medicare Other

## 2014-04-18 DIAGNOSIS — E7800 Pure hypercholesterolemia, unspecified: Secondary | ICD-10-CM

## 2014-04-18 DIAGNOSIS — E539 Vitamin B deficiency, unspecified: Secondary | ICD-10-CM | POA: Diagnosis not present

## 2014-04-18 DIAGNOSIS — E119 Type 2 diabetes mellitus without complications: Secondary | ICD-10-CM | POA: Diagnosis not present

## 2014-04-18 DIAGNOSIS — I10 Essential (primary) hypertension: Secondary | ICD-10-CM | POA: Diagnosis not present

## 2014-04-18 DIAGNOSIS — E559 Vitamin D deficiency, unspecified: Secondary | ICD-10-CM

## 2014-04-18 DIAGNOSIS — E039 Hypothyroidism, unspecified: Secondary | ICD-10-CM

## 2014-04-18 DIAGNOSIS — M949 Disorder of cartilage, unspecified: Secondary | ICD-10-CM

## 2014-04-18 DIAGNOSIS — M899 Disorder of bone, unspecified: Secondary | ICD-10-CM

## 2014-04-18 LAB — LDL CHOLESTEROL, DIRECT: LDL DIRECT: 147 mg/dL

## 2014-04-18 LAB — COMPREHENSIVE METABOLIC PANEL
ALBUMIN: 3.9 g/dL (ref 3.5–5.2)
ALK PHOS: 130 U/L — AB (ref 39–117)
ALT: 8 U/L (ref 0–35)
AST: 12 U/L (ref 0–37)
BUN: 35 mg/dL — ABNORMAL HIGH (ref 6–23)
CHLORIDE: 99 meq/L (ref 96–112)
CO2: 30 mEq/L (ref 19–32)
Calcium: 9.5 mg/dL (ref 8.4–10.5)
Creatinine, Ser: 1.72 mg/dL — ABNORMAL HIGH (ref 0.40–1.20)
GFR: 29.5 mL/min — ABNORMAL LOW (ref >60.00)
Glucose, Bld: 202 mg/dL — ABNORMAL HIGH (ref 70–99)
POTASSIUM: 3.9 meq/L (ref 3.5–5.1)
SODIUM: 138 meq/L (ref 135–145)
Total Bilirubin: 1 mg/dL (ref 0.2–1.2)
Total Protein: 6.7 g/dL (ref 6.0–8.3)

## 2014-04-18 LAB — LIPID PANEL
CHOLESTEROL: 238 mg/dL — AB (ref 0–200)
HDL: 44.2 mg/dL (ref >39.00)
NonHDL: 193.8
Total CHOL/HDL Ratio: 5
Triglycerides: 235 mg/dL — ABNORMAL HIGH (ref 0.0–149.0)
VLDL: 47 mg/dL — ABNORMAL HIGH (ref 0.0–40.0)

## 2014-04-18 LAB — CBC WITH DIFFERENTIAL/PLATELET
Basophils Absolute: 0.1 10*3/uL (ref 0.0–0.1)
Basophils Relative: 0.7 % (ref 0.0–3.0)
Eosinophils Absolute: 0.2 10*3/uL (ref 0.0–0.7)
Eosinophils Relative: 1.8 % (ref 0.0–5.0)
HCT: 42.6 % (ref 36.0–46.0)
Hemoglobin: 14.7 g/dL (ref 12.0–15.0)
LYMPHS PCT: 42 % (ref 12.0–46.0)
Lymphs Abs: 3.8 10*3/uL (ref 0.7–4.0)
MCHC: 34.5 g/dL (ref 30.0–36.0)
MCV: 92.8 fl (ref 78.0–100.0)
MONO ABS: 0.7 10*3/uL (ref 0.1–1.0)
MONOS PCT: 8.1 % (ref 3.0–12.0)
NEUTROS PCT: 47.4 % (ref 43.0–77.0)
Neutro Abs: 4.3 10*3/uL (ref 1.4–7.7)
PLATELETS: 264 10*3/uL (ref 150.0–400.0)
RBC: 4.59 Mil/uL (ref 3.87–5.11)
RDW: 13.9 % (ref 11.5–15.5)
WBC: 9 10*3/uL (ref 4.0–10.5)

## 2014-04-18 LAB — TSH: TSH: 5.92 u[IU]/mL — AB (ref 0.35–4.50)

## 2014-04-18 LAB — HEMOGLOBIN A1C: HEMOGLOBIN A1C: 7.8 % — AB (ref 4.6–6.5)

## 2014-04-18 LAB — VITAMIN B12: VITAMIN B 12: 528 pg/mL (ref 211–911)

## 2014-04-18 LAB — VITAMIN D 25 HYDROXY (VIT D DEFICIENCY, FRACTURES): VITD: 23.3 ng/mL — AB (ref 30.00–100.00)

## 2014-04-24 NOTE — Telephone Encounter (Signed)
pts son Dondra PraderDominique left v/m; pt has appt for wellness exam on 04/25/14. Dondra PraderDominique wants Dr Milinda Antisower to know that the exelon patches do not seem to be helping pts anxiety and [name] request cb.

## 2014-04-24 NOTE — Telephone Encounter (Signed)
They are to slow down memory loss- and do not tend to have an affect on mood / do not expect them to really help anxiety

## 2014-04-24 NOTE — Telephone Encounter (Signed)
Called and spoken with patient's son Charna Archer(Dom). He has a suggestion of medication and will discuss it with Dr Milinda Antisower at appt 04/25/14.

## 2014-04-25 ENCOUNTER — Encounter: Payer: Self-pay | Admitting: Family Medicine

## 2014-04-25 ENCOUNTER — Ambulatory Visit (INDEPENDENT_AMBULATORY_CARE_PROVIDER_SITE_OTHER): Payer: Medicare Other | Admitting: Family Medicine

## 2014-04-25 VITALS — BP 122/72 | HR 66 | Temp 97.4°F | Ht <= 58 in | Wt 127.4 lb

## 2014-04-25 DIAGNOSIS — E039 Hypothyroidism, unspecified: Secondary | ICD-10-CM | POA: Diagnosis not present

## 2014-04-25 DIAGNOSIS — Z23 Encounter for immunization: Secondary | ICD-10-CM | POA: Diagnosis not present

## 2014-04-25 DIAGNOSIS — N19 Unspecified kidney failure: Secondary | ICD-10-CM

## 2014-04-25 DIAGNOSIS — F068 Other specified mental disorders due to known physiological condition: Secondary | ICD-10-CM

## 2014-04-25 DIAGNOSIS — I10 Essential (primary) hypertension: Secondary | ICD-10-CM | POA: Diagnosis not present

## 2014-04-25 DIAGNOSIS — E119 Type 2 diabetes mellitus without complications: Secondary | ICD-10-CM

## 2014-04-25 DIAGNOSIS — Z0000 Encounter for general adult medical examination without abnormal findings: Secondary | ICD-10-CM | POA: Diagnosis not present

## 2014-04-25 DIAGNOSIS — M8580 Other specified disorders of bone density and structure, unspecified site: Secondary | ICD-10-CM

## 2014-04-25 DIAGNOSIS — E559 Vitamin D deficiency, unspecified: Secondary | ICD-10-CM

## 2014-04-25 DIAGNOSIS — F0390 Unspecified dementia without behavioral disturbance: Secondary | ICD-10-CM

## 2014-04-25 DIAGNOSIS — E7800 Pure hypercholesterolemia, unspecified: Secondary | ICD-10-CM

## 2014-04-25 MED ORDER — ALPRAZOLAM 0.5 MG PO TABS: 0.5000 mg | ORAL_TABLET | Freq: Two times a day (BID) | ORAL | Status: DC | PRN

## 2014-04-25 NOTE — Progress Notes (Signed)
Pre visit review using our clinic review tool, if applicable. No additional management support is needed unless otherwise documented below in the visit note. 

## 2014-04-25 NOTE — Progress Notes (Signed)
Subjective:    Patient ID: Sara Lang, female    DOB: Oct 04, 1920, 81 y.o.   MRN: [id number]  HPI Here for annual medicare wellness visit as well as chronic/acute medical problems   Wt is up 1 lb with bmi of 26  I have personally reviewed the Medicare Annual Wellness questionnaire and have noted 1. The patient's medical and social history 2. Their use of alcohol, tobacco or illicit drugs 3. Their current medications and supplements 4. The patient's functional ability including ADL's, fall risks, home safety risks and hearing or visual             impairment. 5. Diet and physical activities 6. Evidence for depression or mood disorders  The patients weight, height, BMI have been recorded in the chart and visual acuity is per eye clinic.  I have made referrals, counseling and provided education to the patient based review of the above and I have provided the pt with a written personalized care plan for preventive services.  More problems with agitation with her dementia Started exelon  Is out of control OCD and obsessed with food - then runs sugar up   Had a fall after having blood drawn when they got home  Did not get hurt -- (crumples over instead of falls)- uses a cane and needs a walker   He "cannot" put her in a nursing home because he "made her a promise" -says he will never do it  He tries to provide a safe environment     See scanned forms.  Routine anticipatory guidance given to patient.  See health maintenance. Colon cancer screening 6/08 - not interested in screening  Breast cancer screening- declines mammogram  Self breast exam-no complaints of lumps  Flu vaccine - had it this season  Tetanus vaccine 6/08 Pneumovax 6/08  Zoster vaccine-nont interested in   Advance directive- has an advanced directive and POA Cognitive function addressed- see scanned forms- and if abnormal then additional documentation follows.  Poor memory and moderate dementia    PMH and  SH reviewed  Meds, vitals, and allergies reviewed.   ROS: See HPI.  Otherwise negative.     opth exam - years ago-not interested in going to more optthy exams   Osteopenia- not interested in bone density tests  dexa D level is 23- will inc that intake   DM2 Lab Results  Component Value Date   HGBA1C 7.8* 04/18/2014  was 7.3 last time  Has not done a lot of bolusing / has 12 levemir in the evening  No low sugars  enourmous appetite   Hypothyroidism  Pt has no clinical changes No change in energy level/ hair or skin/ edema and no tremor Lab Results  Component Value Date   TSH 5.92* 04/18/2014    No missed doses   Lipids Lab Results  Component Value Date   CHOL 238* 04/18/2014   CHOL 189 04/07/2010   CHOL 179 06/25/2008   Lab Results  Component Value Date   HDL 44.20 04/18/2014   HDL 34.40* 04/07/2010   HDL 29.40* 06/25/2008   No results found for: [facility] Lab Results  Component Value Date   TRIG 235.0* 04/18/2014   TRIG 300.0* 04/07/2010   TRIG 485.0* 06/25/2008   Lab Results  Component Value Date   CHOLHDL 5 04/18/2014   CHOLHDL 5 04/07/2010   CHOLHDL 6 06/25/2008   Lab Results  Component Value Date   LDLDIRECT 147.0 04/18/2014   LDLDIRECT 105.2 04/07/2010  LDLDIRECT 88.8 06/25/2008    Not interested in cholesterol medicine   bp is stable today  No cp or palpitations or headaches or edema  No side effects to medicines  BP Readings from Last 3 Encounters:  04/25/14 122/72  05/22/13 118/64  05/20/13 122/64     gerd- is on zantac 150 once per day instead of twice - less burping with that    Patient Active Problem List   Diagnosis Date Noted  . Hypokalemia 05/20/2013  . At risk for falling 12/06/2011  . HYPERKALEMIA 04/23/2010  . ADVERSE DRUG REACTION 03/27/2009  . GOUT 09/25/2008  . OTHER CHRONIC PAIN 08/14/2008  . VITAMIN B12 DEFICIENCY 07/02/2008  . UNSPECIFIED VITAMIN D DEFICIENCY 07/02/2008  . MEMORY LOSS 07/02/2008  . FATIGUE  06/25/2008  . BACK PAIN, CHRONIC 08/04/2006  . FOOT PAIN, CHRONIC 08/04/2006  . OSTEOPENIA 08/04/2006  . HYPOTHYROIDISM 08/02/2006  . DIABETES MELLITUS, TYPE II 08/02/2006  . HYPERCHOLESTEROLEMIA 08/02/2006  . SYNDROME, CHRONIC PAIN 08/02/2006  . CARPAL TUNNEL SYNDROME 08/02/2006  . NEUROPATHY 08/02/2006  . HYPERTENSION 08/02/2006  . GERD 08/02/2006  . PEPTIC ULCER DISEASE 08/02/2006  . RENAL FAILURE 08/02/2006  . OSTEOARTHRITIS 08/02/2006  . JAUNDICE 08/02/2006  . PEDAL EDEMA 08/02/2006   Past Medical History  Diagnosis Date  . Unspecified adverse effect of unspecified drug, medicinal and biological substance   . Backache, unspecified   . Carpal tunnel syndrome   . Type II or unspecified type diabetes mellitus without mention of complication, not stated as uncontrolled   . Other malaise and fatigue   . Pain in limb   . Esophageal reflux   . Gout, unspecified   . Pure hypercholesterolemia   . Hyperpotassemia   . Unspecified essential hypertension   . Unspecified hypothyroidism   . Jaundice, unspecified, not of newborn   . Memory loss   . Mononeuritis of unspecified site   . Osteoarthrosis, unspecified whether generalized or localized, unspecified site   . Disorder of bone and cartilage, unspecified   . Other chronic pain   . Edema   . Peptic ulcer, unspecified site, unspecified as acute or chronic, without mention of hemorrhage, perforation, or obstruction   . Renal failure, unspecified     secondary to Vioxx  . Chronic pain syndrome   . Unspecified vitamin D deficiency   . Other B-complex deficiencies   . Abnormal pancreatic function study     elevated enzymes  . History of transfusion of whole blood    Past Surgical History  Procedure Laterality Date  . Appendectomy  1928  . Cataract extraction    . Cholecystectomy  1956  . Thyroidectomy      throat surgery-cold nodule  . Tonsillectomy    . Toe amputation      x2 Hammer toes  . Toe nail procedure    .  Esophagogastroduodenoscopy      stricture   History  Substance Use Topics  . Smoking status: Never Smoker   . Smokeless tobacco: Not on file  . Alcohol Use: No   Family History  Problem Relation Age of Onset  . Hyperlipidemia Father   . Transient ischemic attack Son   . Depression Son   . Arthritis Mother   . Arthritis Father   . Arthritis      Grandparents   Allergies  Allergen Reactions  . Ace Inhibitors     REACTION: ace inhibitors cause inc K-  . Calcium     REACTION: nausea  .  Donepezil Hydrochloride     REACTION: MS change  . Furosemide     REACTION: reaction not known  . Latex Itching    Red skin and itching  . Memantine     REACTION: MS change  . Oxycodone-Aspirin     REACTION: reaction not known  . Penicillins     REACTION: reaction not known  . Pregabalin     REACTION: sick  . Rofecoxib     REACTION: renal failure  . Sulfonamide Derivatives     REACTION: reaction not known   Current Outpatient Prescriptions on File Prior to Visit  Medication Sig Dispense Refill  . amLODipine (NORVASC) 5 MG tablet TAKE ONE TABLET BY MOUTH EVERY 12 HOURS 60 tablet 3  . insulin detemir (LEVEMIR) 100 UNIT/ML injection Inject 0.12 mLs (12 Units total) into the skin daily. 10 mL 11  . insulin lispro (HUMALOG KWIKPEN) 100 UNIT/ML KiwkPen Inject under skin up to 8 units 3x a day 15 min before a meal 15 mL 11  . Insulin Pen Needle (FIFTY50 PEN NEEDLES) 31G X 5 MM MISC Use 4x a day (Patient taking differently: Use 3x a day) 200 each 11  . Insulin Syringe-Needle U-100 (B-D INS SYR ULTRAFINE .5CC/30G) 30G X 1/2" 0.5 ML MISC Use as directed 100 each 0  . levothyroxine (SYNTHROID, LEVOTHROID) 75 MCG tablet Take 1 tablet (75 mcg total) by mouth daily. 30 tablet 3  . lidocaine (LIDODERM) 5 % Place 1 patch onto the skin daily. Remove & Discard patch within 12 hours or as directed by MD     . omeprazole (PRILOSEC) 20 MG capsule Take 1 capsule (20 mg total) by mouth daily. 30 capsule 3  .  potassium chloride (K-DUR,KLOR-CON) 10 MEQ tablet Take 1 tablet (10 mEq total) by mouth daily. Take with a meal 30 tablet 11  . ranitidine (ZANTAC) 150 MG tablet TAKE 1 TABLET BY MOUTH TWICE A DAY 60 tablet 3  . rivastigmine (EXELON) 4.6 mg/24hr Place 1 patch (4.6 mg total) onto the skin daily. 30 patch 5  . meclizine (ANTIVERT) 32 MG tablet Take 32 mg by mouth as needed.    . [DISCONTINUED] esomeprazole (NEXIUM) 40 MG capsule Take 1 capsule (40 mg total) by mouth 2 (two) times daily. 60 capsule 11   No current facility-administered medications on file prior to visit.    Review of Systems Review of Systems  Constitutional: Negative for fever, appetite change,  and unexpected weight change.  Eyes: Negative for pain and visual disturbance.  Respiratory: Negative for cough and shortness of breath.   Cardiovascular: Negative for cp or palpitations    Gastrointestinal: Negative for nausea, diarrhea and constipation.  Genitourinary: Negative for urgency and frequency.  Skin: Negative for pallor or rash   MSK pos for arthritis stiffness and pain  Neurological: Negative for weakness, light-headedness, numbness and headaches.  Hematological: Negative for adenopathy. Does not bruise/bleed easily.  Psychiatric/Behavioral: Negative for dysphoric mood. The patient is  nervous/anxious.  pos for dementia with occ agitation        Objective:   Physical Exam  Constitutional: She appears well-developed and well-nourished. No distress.  Frail app elderly female  Pleasantly confused -baseline dementia Babbles  Not agitated   HENT:  Head: Normocephalic and atraumatic.  Right Ear: External ear normal.  Left Ear: External ear normal.  Nose: Nose normal.  Mouth/Throat: Oropharynx is clear and moist.  Scant cerumen   Eyes: Conjunctivae and EOM are normal. Pupils are equal, round, and  reactive to light. Right eye exhibits no discharge. Left eye exhibits no discharge. No scleral icterus.  Neck: Normal  range of motion. Neck supple. No JVD present. Carotid bruit is not present. No thyromegaly present.  Cardiovascular: Normal rate, regular rhythm, normal heart sounds and intact distal pulses.  Exam reveals no gallop.   Pulmonary/Chest: Effort normal and breath sounds normal. No respiratory distress. She has no wheezes. She has no rales.  Abdominal: Soft. Bowel sounds are normal. She exhibits no distension and no mass. There is no tenderness.  Musculoskeletal: She exhibits tenderness. She exhibits no edema.  Changes of OA in many joints  kyphosis  Lymphadenopathy:    She has no cervical adenopathy.  Neurological: She is alert. She has normal reflexes. No cranial nerve deficit. She exhibits normal muscle tone. Coordination normal.  Skin: Skin is warm and dry. No rash noted. No erythema. No pallor.  Psychiatric: She has a normal mood and affect. Her mood appears not anxious. Her speech is tangential. She is not agitated. Thought content is not paranoid. Cognition and memory are impaired. She does not exhibit a depressed mood. She expresses no homicidal and no suicidal ideation. She exhibits abnormal recent memory.  Babbling to herself  Dementia noted occ answers a question  Will repeat phrases over and over  She is inattentive.          Assessment & Plan:   Problem List Items Addressed This Visit      Cardiovascular and Mediastinum   Essential hypertension    bp in fair control at this time , in pt with baseline renal failure  BP Readings from Last 1 Encounters:  04/25/14 122/72   No changes needed Labs reviewed       Relevant Medications   furosemide (LASIX) 20 MG tablet     Endocrine   Diabetes type 2, controlled    Lab Results  Component Value Date   HGBA1C 7.8* 04/18/2014   Pt cannot tolerate lower A1C at risk of hypoglycemia No symptoms  Working with SS basal and short acting insulin-her son manages this well  Cannot make the trip farther to see endocrine Cannot  tolerate eye exam       Hypothyroidism    Hypothyroidism  Pt has no clinical changes No change in energy level/ hair or skin/ edema and no tremor Lab Results  Component Value Date   TSH 5.92* 04/18/2014    Will re check this in 3 mo -hesitate to inc dose of levothyroxine unless abs needed          Nervous and Auditory   Dementia arising in the senium and presenium    Worsening agitation lately  Pt's son cares for her and refuses care facility at this time Long disc re: safety On exelon patch to slow down progression-will inc dose in another mo if well tolerated  For agitation- xanax bid only if watched closely for falls- son did voice understanding  Will continue to update       Relevant Medications   ALPRAZolam (XANAX) tablet     Musculoskeletal and Integument   Osteopenia    Declines further dexa  Rev ca and D recommendations-will inc her D Disc need for calcium/ vitamin D/ wt bearing exercise  Disc safety/ fracture risk in detail          Genitourinary   Renal failure    Fairly stable Lab Results  Component Value Date   CREATININE 1.72* 04/18/2014   Pt chooses  not to see renal or treat at this time No symptoms  DM is as well controlled as she can tolerate Continue bp control  Avoid renal toxic meds Enc water intake         Other   Encounter for Medicare annual wellness exam - Primary    Reviewed health habits including diet and exercise and skin cancer prevention Reviewed appropriate screening tests for age  Also reviewed health mt list, fam hx and immunization status , as well as social and family history   See HPI Declines most health mt at her age  Labs reviewed  prevnar vaccine today        HYPERCHOLESTEROLEMIA    Disc goals for lipids and reasons to control them Rev labs with pt Rev low sat fat diet in detail Pt declines treatment of this        Relevant Medications   furosemide (LASIX) 20 MG tablet   Vitamin D deficiency    In  elderly pt with bone loss  Disc supplementation-see AVS Level low at 23        Other Visit Diagnoses    Need for pneumococcal vaccination        Relevant Orders    Pneumococcal conjugate vaccine 13-valent IM (Completed)

## 2014-04-25 NOTE — Patient Instructions (Signed)
Safety is the number one priority right now  To prevent falls - please get a light weight metal walker and use at all times Try xanax for agitation from her dementia - and watch for sedation and falls with this  prevnar vaccine today  Get vitamin D3 and give 2000 iu daily  Schedule lab appt for 3 months for thyroid to re check it  Use current dose of exelon for another month then call and let me know if she tolerates it and we will increase dose (this slows down the dementia progression)

## 2014-04-26 ENCOUNTER — Other Ambulatory Visit: Payer: Self-pay | Admitting: Family Medicine

## 2014-04-27 DIAGNOSIS — Z0000 Encounter for general adult medical examination without abnormal findings: Secondary | ICD-10-CM | POA: Insufficient documentation

## 2014-04-27 NOTE — Assessment & Plan Note (Signed)
Declines further dexa  Rev ca and D recommendations-will inc her D Disc need for calcium/ vitamin D/ wt bearing exercise  Disc safety/ fracture risk in detail

## 2014-04-27 NOTE — Assessment & Plan Note (Signed)
Worsening agitation lately  Pt's son cares for her and refuses care facility at this time Long disc re: safety On exelon patch to slow down progression-will inc dose in another mo if well tolerated  For agitation- xanax bid only if watched closely for falls- son did voice understanding  Will continue to update

## 2014-04-27 NOTE — Assessment & Plan Note (Signed)
Lab Results  Component Value Date   HGBA1C 7.8* 04/18/2014   Pt cannot tolerate lower A1C at risk of hypoglycemia No symptoms  Working with SS basal and short acting insulin-her son manages this well  Cannot make the trip farther to see endocrine Cannot tolerate eye exam

## 2014-04-27 NOTE — Assessment & Plan Note (Signed)
In elderly pt with bone loss  Disc supplementation-see AVS Level low at 23

## 2014-04-27 NOTE — Assessment & Plan Note (Signed)
Disc goals for lipids and reasons to control them Rev labs with pt Rev low sat fat diet in detail Pt declines treatment of this

## 2014-04-27 NOTE — Assessment & Plan Note (Signed)
Hypothyroidism  Pt has no clinical changes No change in energy level/ hair or skin/ edema and no tremor Lab Results  Component Value Date   TSH 5.92* 04/18/2014    Will re check this in 3 mo -hesitate to inc dose of levothyroxine unless abs needed

## 2014-04-27 NOTE — Assessment & Plan Note (Signed)
bp in fair control at this time , in pt with baseline renal failure  BP Readings from Last 1 Encounters:  04/25/14 122/72   No changes needed Labs reviewed

## 2014-04-27 NOTE — Assessment & Plan Note (Signed)
Fairly stable Lab Results  Component Value Date   CREATININE 1.72* 04/18/2014   Pt chooses not to see renal or treat at this time No symptoms  DM is as well controlled as she can tolerate Continue bp control  Avoid renal toxic meds Enc water intake

## 2014-04-27 NOTE — Assessment & Plan Note (Signed)
Reviewed health habits including diet and exercise and skin cancer prevention Reviewed appropriate screening tests for age  Also reviewed health mt list, fam hx and immunization status , as well as social and family history   See HPI Declines most health mt at her age  Labs reviewed  prevnar vaccine today

## 2014-05-21 ENCOUNTER — Telehealth: Payer: Self-pay

## 2014-05-21 NOTE — Telephone Encounter (Signed)
Agree with f/u with PCP next week. Sounds like progressive worsening of dementia. Ensure no fever, abd pain, worsening urinary sxs like incontinence or dysuria, cough or dyspnea.

## 2014-05-21 NOTE — Telephone Encounter (Signed)
Patient son is concerned that patient is declining.  He was told her rivastigmine was denied.  Prior Berkley Harveyauth is being reprocessed.  When he got up this morning he found the patient laying on the floor.  She told him she liked to sleep there.  Patient son is worried about her.  Appointment made to discuss changes.  Advised son that if she gets worse or any significant changes to take her to the hospital.  He says she is sleeping a lot.  Please advise if any other recommendations.

## 2014-05-21 NOTE — Telephone Encounter (Signed)
Dom pts son found out reason PA was denied was due to medication not coinciding with diagnosis. Dom request cb. Dom advised as instructed by Dr Reece AgarG and Dr Timoteo ExposeG's symptoms were listed and Dom said pt's appetite has decreased a lot and pt cannot remember even how to dress herself. Dom will cb if pt condition changes or worsens prior to cb.

## 2014-05-22 NOTE — Telephone Encounter (Signed)
As we discussed in the office as well - the exelon patch is not expected to improve condition- just slightly slow down progression  I am not in [place] currently- but would they like me to refer them to neurology for further eval in the meantime?  Also - do take her to ED if sudden delerium or loss of function  Thanks

## 2014-05-22 NOTE — Telephone Encounter (Signed)
Tried to contact patient but voicemail was full.  Appeal for medication (patches) was sent 05/21/2014.  Also re-processed prior auth with a new diagnosis.  If patient is no better she should be seen at the office or go to ER.

## 2014-05-23 ENCOUNTER — Telehealth: Payer: Self-pay | Admitting: Family Medicine

## 2014-05-23 ENCOUNTER — Telehealth: Payer: Self-pay

## 2014-05-23 NOTE — Telephone Encounter (Signed)
Noted  

## 2014-05-23 NOTE — Telephone Encounter (Signed)
Exelon 4.6 patch has been approved #[id number].

## 2014-05-23 NOTE — Telephone Encounter (Signed)
The expedited appeal has been approved and we should be getting a letter stating this.

## 2014-05-26 ENCOUNTER — Ambulatory Visit: Payer: Medicare Other | Admitting: Family Medicine

## 2014-05-26 ENCOUNTER — Telehealth: Payer: Self-pay | Admitting: Family Medicine

## 2014-05-26 ENCOUNTER — Other Ambulatory Visit: Payer: Self-pay

## 2014-05-26 NOTE — Telephone Encounter (Signed)
Canceled, pt's son scheduled appointment / lt

## 2014-05-26 NOTE — Telephone Encounter (Signed)
Error

## 2014-05-27 ENCOUNTER — Other Ambulatory Visit: Payer: Self-pay | Admitting: Family Medicine

## 2014-05-30 ENCOUNTER — Encounter: Payer: Self-pay | Admitting: Family Medicine

## 2014-05-30 ENCOUNTER — Ambulatory Visit (INDEPENDENT_AMBULATORY_CARE_PROVIDER_SITE_OTHER): Payer: Medicare Other | Admitting: Family Medicine

## 2014-05-30 VITALS — BP 116/80 | HR 63 | Ht <= 58 in | Wt 126.1 lb

## 2014-05-30 DIAGNOSIS — M1990 Unspecified osteoarthritis, unspecified site: Secondary | ICD-10-CM | POA: Diagnosis not present

## 2014-05-30 DIAGNOSIS — F0390 Unspecified dementia without behavioral disturbance: Secondary | ICD-10-CM

## 2014-05-30 DIAGNOSIS — F068 Other specified mental disorders due to known physiological condition: Secondary | ICD-10-CM | POA: Diagnosis not present

## 2014-05-30 NOTE — Progress Notes (Signed)
Pre visit review using our clinic review tool, if applicable. No additional management support is needed unless otherwise documented below in the visit note. 

## 2014-05-30 NOTE — Patient Instructions (Signed)
[facility] has a program for dementia/consult - call [phone] [facility]([place]) - ask for an appt.  I will put in a referral for home health -we will call you about that  See how we do with the exelon patch at the low dose- after a month- we can increase the dose to 9.5 mg - let me know in a month if you want to do this  Keep your lab appt for June

## 2014-05-30 NOTE — Progress Notes (Signed)
Subjective:    Patient ID: Sara Lang, female    DOB: Jun 09, 1920, 81 y.o.   MRN: [id number]  HPI Here for dementia   Using the exelon patch - was on it for a while (had to stop when ins would not cover)- a rough week It seems to help her   Problems using utensils to eat  Eating slower but more delibrately  She has help dressing  Appetite has evened out -that is very helpful  Son eats with her  Using thicken up for her liquids (has some trouble swallowing water)  Does not want a swallowing study at this time     Cannot leave her alone to go to the store  His brother moved in with them- helpful (is a Engineer, civil (consulting))   Son is educated Belongs to the [employer]   Great mobility issues  Getting hard on her son  -especially bathing her     Has fallen when she does not have a patch on - with the patch she seems more steady  Agitation is well controlled with xanax - does not think this caused her falls    Patient Active Problem List   Diagnosis Date Noted  . Encounter for Medicare annual wellness exam 04/27/2014  . Hypokalemia 05/20/2013  . At risk for falling 12/06/2011  . HYPERKALEMIA 04/23/2010  . ADVERSE DRUG REACTION 03/27/2009  . GOUT 09/25/2008  . OTHER CHRONIC PAIN 08/14/2008  . VITAMIN B12 DEFICIENCY 07/02/2008  . Vitamin D deficiency 07/02/2008  . Dementia arising in the senium and presenium 07/02/2008  . FATIGUE 06/25/2008  . BACK PAIN, CHRONIC 08/04/2006  . FOOT PAIN, CHRONIC 08/04/2006  . Osteopenia 08/04/2006  . Hypothyroidism 08/02/2006  . Diabetes type 2, controlled 08/02/2006  . HYPERCHOLESTEROLEMIA 08/02/2006  . SYNDROME, CHRONIC PAIN 08/02/2006  . CARPAL TUNNEL SYNDROME 08/02/2006  . NEUROPATHY 08/02/2006  . Essential hypertension 08/02/2006  . GERD 08/02/2006  . PEPTIC ULCER DISEASE 08/02/2006  . Renal failure 08/02/2006  . OSTEOARTHRITIS 08/02/2006  . JAUNDICE 08/02/2006  . PEDAL EDEMA 08/02/2006   Past Medical History  Diagnosis Date    . Unspecified adverse effect of unspecified drug, medicinal and biological substance   . Backache, unspecified   . Carpal tunnel syndrome   . Type II or unspecified type diabetes mellitus without mention of complication, not stated as uncontrolled   . Other malaise and fatigue   . Pain in limb   . Esophageal reflux   . Gout, unspecified   . Pure hypercholesterolemia   . Hyperpotassemia   . Unspecified essential hypertension   . Unspecified hypothyroidism   . Jaundice, unspecified, not of newborn   . Memory loss   . Mononeuritis of unspecified site   . Osteoarthrosis, unspecified whether generalized or localized, unspecified site   . Disorder of bone and cartilage, unspecified   . Other chronic pain   . Edema   . Peptic ulcer, unspecified site, unspecified as acute or chronic, without mention of hemorrhage, perforation, or obstruction   . Renal failure, unspecified     secondary to Vioxx  . Chronic pain syndrome   . Unspecified vitamin D deficiency   . Other B-complex deficiencies   . Abnormal pancreatic function study     elevated enzymes  . History of transfusion of whole blood    Past Surgical History  Procedure Laterality Date  . Appendectomy  1928  . Cataract extraction    . Cholecystectomy  1956  . Thyroidectomy  throat surgery-cold nodule  . Tonsillectomy    . Toe amputation      x2 Hammer toes  . Toe nail procedure    . Esophagogastroduodenoscopy      stricture   History  Substance Use Topics  . Smoking status: Never Smoker   . Smokeless tobacco: Not on file  . Alcohol Use: No   Family History  Problem Relation Age of Onset  . Hyperlipidemia Father   . Transient ischemic attack Son   . Depression Son   . Arthritis Mother   . Arthritis Father   . Arthritis      Grandparents   Allergies  Allergen Reactions  . Ace Inhibitors     REACTION: ace inhibitors cause inc K-  . Calcium     REACTION: nausea  . Donepezil Hydrochloride     REACTION: MS  change  . Furosemide     REACTION: reaction not known  . Latex Itching    Red skin and itching  . Memantine     REACTION: MS change  . Oxycodone-Aspirin     REACTION: reaction not known  . Penicillins     REACTION: reaction not known  . Pregabalin     REACTION: sick  . Rofecoxib     REACTION: renal failure  . Sulfonamide Derivatives     REACTION: reaction not known   Current Outpatient Prescriptions on File Prior to Visit  Medication Sig Dispense Refill  . ALPRAZolam (XANAX) 0.5 MG tablet Take 1 tablet (0.5 mg total) by mouth 2 (two) times daily as needed for anxiety (for agitation- watching for falls). 60 tablet 3  . amLODipine (NORVASC) 5 MG tablet TAKE ONE (1) TABLET BY MOUTH EVERY 12 HOURS 180 tablet 3  . furosemide (LASIX) 20 MG tablet Take 20 mg by mouth.    . insulin detemir (LEVEMIR) 100 UNIT/ML injection Inject 0.12 mLs (12 Units total) into the skin daily. 10 mL 11  . insulin lispro (HUMALOG KWIKPEN) 100 UNIT/ML KiwkPen Inject under skin up to 8 units 3x a day 15 min before a meal 15 mL 11  . Insulin Pen Needle (FIFTY50 PEN NEEDLES) 31G X 5 MM MISC Use 4x a day (Patient taking differently: Use 3x a day) 200 each 11  . Insulin Syringe-Needle U-100 (B-D INS SYR ULTRAFINE .5CC/30G) 30G X 1/2" 0.5 ML MISC Use as directed 100 each 0  . levothyroxine (SYNTHROID, LEVOTHROID) 75 MCG tablet TAKE 1 TABLET BY MOUTH DAILY 90 tablet 3  . lidocaine (LIDODERM) 5 % Place 1 patch onto the skin daily. Remove & Discard patch within 12 hours or as directed by MD     . meclizine (ANTIVERT) 32 MG tablet Take 32 mg by mouth as needed.    Marland Kitchen. omeprazole (PRILOSEC) 20 MG capsule TAKE ONE CAPSULE BY MOUTH DAILY 90 capsule 3  . potassium chloride (K-DUR,KLOR-CON) 10 MEQ tablet TAKE 1 TABLET BY MOUTH DAILY WITH A MEAL 30 tablet 0  . ranitidine (ZANTAC) 150 MG tablet TAKE 1 TABLET BY MOUTH TWICE A DAY 60 tablet 3  . rivastigmine (EXELON) 4.6 mg/24hr Place 1 patch (4.6 mg total) onto the skin daily. 30  patch 5  . [DISCONTINUED] esomeprazole (NEXIUM) 40 MG capsule Take 1 capsule (40 mg total) by mouth 2 (two) times daily. 60 capsule 11   No current facility-administered medications on file prior to visit.     Review of Systems Review of Systems  Constitutional: Negative for fever, appetite change, fatigue and unexpected weight  change.  Eyes: Negative for pain and visual disturbance.  Respiratory: Negative for cough and shortness of breath.   Cardiovascular: Negative for cp or palpitations    Gastrointestinal: Negative for nausea, diarrhea and constipation.  Genitourinary: Negative for urgency and frequency.  MSK pos for arthritis pain and stiffness causing immobility  Skin: Negative for pallor or rash   Neurological: Negative for weakness, light-headedness, numbness and headaches. pos for short term memory loss and poor balance  Hematological: Negative for adenopathy. Does not bruise/bleed easily.  Psychiatric/Behavioral: Negative for dysphoric mood. The patient is anxious/agitated at times with dementia        Objective:   Physical Exam  Constitutional: She appears well-developed and well-nourished. No distress.  HENT:  Head: Normocephalic and atraumatic.  Mouth/Throat: Oropharynx is clear and moist.  Eyes: Conjunctivae and EOM are normal. Pupils are equal, round, and reactive to light.  Neck: Normal range of motion. Neck supple. No JVD present. Carotid bruit is not present. No thyromegaly present.  Cardiovascular: Normal rate, regular rhythm, normal heart sounds and intact distal pulses.  Exam reveals no gallop.   Pulmonary/Chest: Effort normal and breath sounds normal. No respiratory distress. She has no wheezes. She has no rales.  No crackles  Abdominal: Soft. Bowel sounds are normal. She exhibits no distension, no abdominal bruit and no mass. There is no tenderness.  Musculoskeletal: She exhibits no edema.  Needs both a walker and physical help with ambulation   Joint  deformities are baseline in hands and feet   Lymphadenopathy:    She has no cervical adenopathy.  Neurological: She is alert. She has normal reflexes.  Unable to ambulate without walker and assistance of another person    Skin: Skin is warm and dry. No rash noted.  Psychiatric: Cognition and memory are impaired. She exhibits abnormal remote memory.  Pt is confused but pleasant and content  Repeats phrases            Assessment & Plan:   Problem List Items Addressed This Visit      Nervous and Auditory   Dementia arising in the senium and presenium - Primary    Worsening fairly rapidly Has chosen to avoid aggressive work up  So far sons have been able to care for her at home -disc safety in detail  Her primary caregiver feels that the exelon patch is helping her function globally  Can inc dose in 1 mo if she tolerates well - he will update  For extra help with ADLs- such as bathing- will ref for home care (she is home bound)

## 2014-06-01 NOTE — Assessment & Plan Note (Signed)
Worsening fairly rapidly Has chosen to avoid aggressive work up  So far sons have been able to care for her at home -disc safety in detail  Her primary caregiver feels that the exelon patch is helping her function globally  Can inc dose in 1 mo if she tolerates well - he will update  For extra help with ADLs- such as bathing- will ref for home care (she is home bound)

## 2014-06-02 ENCOUNTER — Other Ambulatory Visit: Payer: Self-pay | Admitting: Family Medicine

## 2014-06-06 ENCOUNTER — Ambulatory Visit: Payer: Medicare Other | Admitting: Family Medicine

## 2014-06-09 ENCOUNTER — Telehealth: Payer: Self-pay

## 2014-06-09 NOTE — Telephone Encounter (Signed)
[facility] has contacted him  They are coming on wed 4/20.

## 2014-06-09 NOTE — Telephone Encounter (Signed)
Thanks- that is unusual -please try calling her son Sara Lang (who lives with her and cares for her)  - I'm surprised they cannot reach him - ? If they have the incorrect number Thanks

## 2014-06-09 NOTE — Telephone Encounter (Signed)
Mandy RN with [facility] left v/m; order was received for home care services and Angelica ChessmanMandy has not been able to reach pt or pts emergency contact to open home health services since 06/05/14 and wanted Dr Milinda Antisower to be aware pt did not have opened acct with [facility] yet.

## 2014-06-09 NOTE — Telephone Encounter (Signed)
Thanks

## 2014-06-11 ENCOUNTER — Telehealth: Payer: Self-pay

## 2014-06-11 DIAGNOSIS — E538 Deficiency of other specified B group vitamins: Secondary | ICD-10-CM | POA: Diagnosis not present

## 2014-06-11 DIAGNOSIS — F068 Other specified mental disorders due to known physiological condition: Secondary | ICD-10-CM | POA: Diagnosis not present

## 2014-06-11 DIAGNOSIS — I10 Essential (primary) hypertension: Secondary | ICD-10-CM | POA: Diagnosis not present

## 2014-06-11 DIAGNOSIS — M1990 Unspecified osteoarthritis, unspecified site: Secondary | ICD-10-CM | POA: Diagnosis not present

## 2014-06-11 DIAGNOSIS — E119 Type 2 diabetes mellitus without complications: Secondary | ICD-10-CM | POA: Diagnosis not present

## 2014-06-11 DIAGNOSIS — Z794 Long term (current) use of insulin: Secondary | ICD-10-CM | POA: Diagnosis not present

## 2014-06-11 DIAGNOSIS — E875 Hyperkalemia: Secondary | ICD-10-CM | POA: Diagnosis not present

## 2014-06-11 NOTE — Telephone Encounter (Signed)
Please ok that verbal order  

## 2014-06-11 NOTE — Telephone Encounter (Signed)
Mandy RN with [facility] left v/m requesting verbal order for home health aide.Please advise.

## 2014-06-11 NOTE — Telephone Encounter (Signed)
Left voicemail giving Angelica ChessmanMandy the verbal order for home health aide
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
## 2014-06-13 NOTE — Discharge Summary (Signed)
PATIENT NAME:  Sara Lang, Sara Lang MR#:  [id number] DATE OF BIRTH:  May 29, 1920  DATE OF ADMISSION:  05/30/2012 DATE OF DISCHARGE:  05/31/2012  DISCHARGE DIAGNOSES:  1.  Metabolic encephalopathy, likely due to urinary tract infection with underlying dementia.  2.  Sinus bradycardia, likely from clonidine and metoprolol, possibly underlying sick sinus syndrome, now stopped clonidine and metoprolol both and started on Norvasc, doing well.  3.  Urinary tract infection based on urinalysis, urine culture growing mixed organisms.   SECONDARY DIAGNOSES:  1.  Hypothyroidism.  2.  Type 2 diabetes.  3.  Gout.  4.  Chronic pain syndrome.  5.  Arthritis.  6.  Neuropathy.  7.  Gastroesophageal reflux disease.  8.  Peptic ulcer disease.  9.  Chronic kidney disease.  10. Chronic back pain.  11. Osteopenia. 12. Dementia.  13. Hypertension.   CONSULTATION: Cardiology, Dr. Mariah MillingGollan.   PROCEDURES/RADIOLOGY: None.   MAJOR LABORATORY PANEL: Urinalysis on admission showed trace bacteria, 4 WBCs, trace leukocyte esterase. Urine culture grew mixed organisms.   HISTORY AND SHORT HOSPITAL COURSE: The patient is an 81 year old female with the above-mentioned medical problems, who was admitted for metabolic encephalopathy thought to be secondary to UTI. The patient was also found to have sinus bradycardia with heart rate as low as 40s and 50s. The patient was taken off clonidine and metoprolol. which was thought to be contributing reason for her bradycardia. Cardiology consultation was obtained with Dr. Mariah MillingGollan, who agreed with the same and recommended starting Norvasc for better control of her blood pressure, which was done. The patient was doing much better and was discharged back home in stable condition. On the date of discharge, her vital signs were as follows: Temperature 98.3, heart rate 64 per minute, respirations 18 per minute, blood pressure 151/73 mm/hg, she was saturating 97% on room air.   PERTINENT PHYSICAL  EXAMINATION ON THE DATE OF DISCHARGE:  CARDIOVASCULAR: S1, S2 normal. No murmurs, rubs, or gallop.  LUNGS: Clear to auscultation bilaterally. No wheezes, rales, rhonchi, or crepitation.  ABDOMEN: Soft, benign.  NEUROLOGIC: Nonfocal examination. Her mental status was quite close to her baseline and was much alert. All other physical examination remained at baseline.   DISCHARGE MEDICATIONS:  1.  Levothyroxine 100 mcg p.o. daily. 2.  Omeprazole 20 mg p.o. daily. 3.  Meclizine 12.5 mg p.o. every 6 hours as needed.  4.  Ranitidine 150 mg p.o. b.i.d.  5.  Lasix 20 mg p.o. daily.  6.  Humulin N sliding scale 3 times a day.  7.  Amlodipine 5 mg p.o. b.i.d.  8.  Levaquin 250 mg p.o. daily for 3 more days.   DISCHARGE DIET: Low sodium.   DISCHARGE ACTIVITY: As tolerated.   DISCHARGE INSTRUCTIONS AND FOLLOWUP: The patient was instructed to follow up with her primary care physician, Dr. Roxy MannsMarne Tower, in 1 to 2 weeks. She will need followup with Dr. Mariah MillingGollan from cardiology in 2 to 4 weeks. She was set up to get home health with physical therapy, occupational therapy along with nursing aide.   TOTAL TIME DISCHARGING THIS PATIENT: 45 minutes.     ____________________________ Ellamae SiaVipul S. Sherryll BurgerShah, MD vss:aw D: 06/04/2012 11:26:07 ET T: 06/04/2012 11:47:55 ET JOB#: [id number]  cc: Rajean Desantiago S. Sherryll BurgerShah, MD, <Dictator> Marne A. Milinda Antisower, MD Antonieta Ibaimothy J. Gollan, MD Ellamae SiaVIPUL S [facility] MD ELECTRONICALLY SIGNED 06/04/2012 12:09

## 2014-06-13 NOTE — Discharge Summary (Signed)
DATE OF BIRTH:  08-17-1920  DATE OF ADMISSION:  05/08/2012  DATE OF DISCHARGE:  05/10/2012  HISTORY OF PRESENT ILLNESS: An 81 year old female who was discharged previously from the hospital after treatment of uncontrolled diabetes mellitus, brought back by family with complaint of dizziness, and she was found having elevated troponin of 0.15. No chest pain or EKG changes. Admitted for further management.   HOSPITAL COURSE AND STAY:  For complaint of dizziness and elevated troponin, she was admitted to follow cardiac status. Cardiology consult was done, and troponins were followed. Her troponins remained stable, without any further rise. Cardiology consult with Dr. Marilynne HalstedBoland was done, and his impression was as follows: No significant changes in troponin, blood pressure  elevated on arrival, no significant EKG changes, no chest pain. Advised to continue on low dose aspirin. An echocardiogram was done. Echo reported normal ejection fraction 55% to 60%, impaired relaxation pattern of LV diastolic filling, mild aortic valve sclerosis without stenosis, normal right ventricular systolic pressure. CT scan of the head was done, which was negative, without any abnormalities, appropriate change for age. Her  complaint of dizziness persisted. She was not orthostatic also, and the dizziness was happening even when lying down with movement of head sometimes. Started on meclizine, with possible diagnosis of benign positional vertigo. I explained to her and her family about the findings and what to expect. They agreed, as there are older, major and serious things like cardiac and neurological were less likely in her case, so they agreed, and she was discharged home.   Other medical issues addressed during this hospital stay:  1.  Chronic kidney disease. Her baseline was unknown, but creatinine came down to 1.6.  2.  Elevated troponin, likely due to acute illness, like episode of hypoglycemia on hospital admission was  trending down from 0.1, and so no further workup advised by Dr. Mariah MillingGollan.   3.  Diabetes. Came under control with NPH.  4.  Hypertension. Metoprolol started, and blood pressure remained better controlled.  5.  Dementia, stable.  IMPORTANT LAB VALUES IN THE HOSPITAL:  HbA1c was 16.5. Troponin on admission 0.14, which came down to 0.10. CBC within normal limits. Creatinine was 1.6, and remained stable at that level. Echocardiogram result as mentioned above.  CONSULT:  With cardiologist, Dr. Mariah MillingGollan.   FINAL DIAGNOSES: 1.  Dizziness. 2.  Benign positional vertigo.  3.  Diabetes.  4.  Dementia.  5.  Chronic kidney disease.  6.  Hypertension.  7.  Elevated troponin, no cardiac causes.  CODE STATUS ON DISCHARGE:   NO CODE, DO NOT RESUSCITATE.  HOME MEDICATIONS ADVISED AFTER DISCHARGE:  1.  Levothyroxine 100 mcg once a day. 2.  Omeprazole 20 mg once a day. 3.  Insulin pen 100 units/mL use 7 units subcutaneous 2 times a day before meals. 4.  Meclizine 12.5 mg oral tablet every 6 hours as needed for dizziness. 5.  Metoprolol 25 mg oral extended release 1 tablet orally once a day.  6.  Furosemide 20 mg oral tablet take 0.5 mg oral once a day.  HOME HEALTH SERVICES: Advised on discharge.   HOME OXYGEN:  No.   DIET: Low-sodium, carbohydrate-controlled ADA diet, regular consistency,    FOLLOWUP: I asked her to follow up within 1 to 2 weeks with Dr.  Dana AllanMarwan Powers.  TOTAL TIME SPENT ON THIS DISCHARGE:  45 minutes.     ____________________________ Hope PigeonVaibhavkumar G. Elisabeth PigeonVachhani, MD vgv:mr D: 05/13/2012 21:58:00 ET T: 05/13/2012 22:31:03 ET JOB#: [id number]  cc: Hope Pigeon. Elisabeth Pigeon, MD, <Dictator> Marwan T. Powers, MD Altamese Dilling MD ELECTRONICALLY SIGNED 05/24/2012 14:48

## 2014-06-13 NOTE — H&P (Signed)
PATIENT NAME:  Sara Lang, Sara Lang MR#:  [id number] DATE OF BIRTH:  05-May-1920  DATE OF ADMISSION:  05/30/2012  REFERRING PHYSICIAN: Dr. Manson PasseyBrown.   CHIEF COMPLAINT: Confusion.   HISTORY OF PRESENT ILLNESS: The patient is an 81 year old female with a past medical history of dementia, hypothyroidism, arthritis, diabetes mellitus, hypertension and chronic kidney disease. She is brought into the ER by her 2 sons with a chief complaint of confusion. Her 2 sons, who are at bedside, are reporting that the patient is more confused than her baseline for the past 2 days. She is doing weird things. Started washing the same clothes again and again in the middle of the night at around 2:00 and also using wrong dish detergent in the dishwasher. As they are worried about her altered mental status, she was brought into the ER. The patient's blood pressure was high initially at 204/81. The ER physician has given clonidine 0.1 mg p.o. stat regarding her hypertensive urgency. Following that, the patient's blood pressure trended down to 174/79, but the patient became bradycardic. The patient was asymptomatic, but as she is pleasantly confused, the patient's 2 sons are worried. Her heart rate was fluctuating between 49 to 50 during my examination. The patient's sons are reporting that their mom was never bradycardic like this and are worried about their mom. Eventually, her blood pressure dropped down to 108/61 with a pulse rate of 49. The patient has answered most of my questions appropriately, but she could not recognize her second son, Mr. Mindi Junkerl, and recognized him as her brother but she has answered her name, date of birth and the hospital name appropriately. The patient is just saying that she is feeling fine. She denies any chest pain or shortness of breath. Denies any abdominal pain, nausea, vomiting. Denies any palpitations either. The patient seemed to be pleasantly confused intermittently. Answered a few questions accurately, but  she was confused with a few of the questions.   PAST MEDICAL HISTORY: Hypothyroidism, diabetes mellitus type 2, gout, chronic pain syndrome, arthritis, neuropathy, GERD, peptic ulcer disease, chronic kidney disease, chronic back pain, osteopenia, dementia, hypertension.   PAST SURGICAL HISTORY: Cholecystectomy, tonsillectomy, total thyroidectomy and throat surgery for a cold nodule, toe amputation, toenail procedure, cataract surgery.   ALLERGIES: AMOXICILLIN, NSAIDS, PENICILLIN, PERCOCET, SULFA DRUGS, LATEX.    HOME MEDICATIONS:  omeprazole 20 mg once daily, metoprolol 25 mg once daily, meclizine 12.5 mg every 6 hours, levothyroxine 100 mcg once daily, Lasix 20 mg once daily, Humulin subcutaneously 3 times a day.   PSYCHOSOCIAL HISTORY: The patient lives with her son. No history of smoking, alcohol or illicit drug usage.   FAMILY HISTORY: TIA and hyperlipidemia, as well as depression runs in her family.   REVIEW OF SYSTEMS: Unobtainable as the patient is pleasantly confused on top of her baseline dementia.   PHYSICAL EXAMINATION:  VITAL SIGNS: Temperature 97.7, pulse 49, respirations 18, blood pressure latest one is 108/61, pulse ox 97% on room air.  GENERAL APPEARANCE: Not in acute distress, moderately built and moderately nourished.  HEENT: Normocephalic, atraumatic. Pupils are equally reacting to light and accommodation. No scleral icterus. Extraocular movements are intact. Moist mucous membranes. No sinus tenderness. No pharyngeal exudates.  NECK: Supple. No JVD. No thyromegaly. No lymphadenopathy.  LUNGS: Clear to auscultation bilaterally. No accessory muscle usage. No anterior chest wall tenderness on palpation.  CARDIAC: S1, S2 normal. Regular rhythm, sinus bradycardic. No murmurs. No thrills. No edema.  GASTROINTESTINAL: Soft. Bowel sounds are positive in all 4  quadrants. Nontender, nondistended. No masses felt. No hepatosplenomegaly.  NEUROLOGIC: Awake, alert, oriented x2 with  intermittent episodes of confusion. Reflexes are 2+. I could not examine motor and sensory and cranial nerves as the patient is pleasantly confused.  SKIN: Warm to touch. Normal turgor. No lesions, rashes noticed.   MUSCULOSKELETAL: No joint effusion, tenderness or erythema noticed.  PSYCHIATRIC: Mood and affect cannot be determined as the patient is confused and demented.   LABS AND IMAGING STUDIES: A 12-LEAD EKG: Sinus bradycardia, normal PR interval. URINALYSIS: Yellow in color, clear in appearance, glucose negative, bilirubin negative, ketones negative, specific gravity 1.010, nitrite negative.  WBC 8.0, hemoglobin 13.7, hematocrit 39.6, platelet count is 215. Troponin T less than 0.02. LFTs are within normal range. Glucose 172, BUN 30, creatinine 1.42, sodium 139, potassium 3.9, chloride 104, CO2 28. GFR is 33. Anion gap 7. Osmolality of the serum 288. Calcium 8.5. The patient's previous creatinine was at a baseline 1.60 ten days ago.   ASSESSMENT AND PLAN: An 81 year old Caucasian female with baseline dementia, was brought into the ER for worsening of mentation. Will be admitted with the following assessment and plan.  1. Altered mental status, probably from acute cystitis: Will admit her to telemetry. Will get neuro checks. The patient's son, Mr. Eda Magnussen, who is also the medical power of attorney, has deferred CAT scan of the head for now as it would cause a lot of radiation exposure to his mom. He would like [place] to treat his mom for urinary tract infection. Because of the confusion, the patient will be n.p.o. Will provide her intravenous fluids while the patient is n.p.o. Will provide aspiration and fall precautions.  2. Sinus bradycardia, probably from clonidine: Would not give clonidine in the future given the side effect. The patient will be monitored continuously on telemetry. Cardiology consult is placed to Dr. Mariah Milling. Will hold her home medications, beta blocker and other rate-limiting  drugs at this time.  3. Acute cystitis: Will obtain urine culture and sensitivity. Will give her levofloxacin while the culture is pending and monitor renal function closely given the history of chronic renal insufficiency. Pharmacy to dose.  4. Chronic kidney disease: Will avoid nephrotoxins and will provide her gentle hydration and monitor renal function closely.  5. Diabetes mellitus: The patient being n.p.o. Will hold off her home medications and put her on insulin sliding scale.  6. Given the history of gastroesophageal reflux disease and peptic ulcer disease, will provide her gastrointestinal prophylaxis.  7. Deep vein thrombosis prophylaxis with heparin subcutaneous.   CODE STATUS: The patient's son, Mr. Kelcey Korus, who is the medical power of attorney, will decide the code status soon. They are debating between limited code versus full code. The rounding physician to discuss with the sons regarding the code status.  They are requesting daily update of their mom's status from rounding physician. The diagnosis and plan of care was discussed in detail with both sons at bedside. All of their questions were answered to their satisfaction. They both verbalized good understanding of the plan. The patient's ER nurse, Ms. [place], and ER chart nurse, Ms. Raquel, are present during the entire encounter.   TOTAL TIME SPENT ON THE ADMISSION: 60 minutes.    ____________________________ Ramonita Lab, MD ag:gb D: 05/30/2012 04:22:26 ET T: 05/30/2012 05:13:47 ET JOB#: [id number]  cc: Ramonita Lab, MD, <Dictator> Antonieta Iba, MD Ramonita Lab MD ELECTRONICALLY SIGNED 06/03/2012 5:37

## 2014-06-13 NOTE — Discharge Summary (Signed)
PATIENT NAME:  Sara Lang, Sara Lang MR#:  [id number] DATE OF BIRTH:  1920-04-01  DATE OF ADMISSION:  05/06/2012 DATE OF DISCHARGE:  05/07/2012  PRIMARY CARE PHYSICIAN: Roxy MannsMarne Tower, MD  DISCHARGE DIAGNOSES: 1.  Hyperosmolar nonketotic hyperglycemia.  2.  Dehydration.  3.  Weakness.  4.  Acute renal failure/chronic kidney disease.   CONSULTANTS: None.   IMAGING STUDIES:  Included CT of the head without contrast which showed no acute abnormalities.   ADMITTING HISTORY AND PHYSICAL: Please see detailed H and P dictated by Dr. Randol KernElgergawy on 05/06/2012.  In brief, an 81 year old Caucasian female patient with prior history of diabetes but with episodes of hypoglycemia, not on insulin, who presented to the Emergency Room complaining of weakness and unable to get up and walk. The patient was found to have blood sugars elevated at 611 with hyponatremia of 127, worsening renal function, and admitted to the hospitalist service.   HOSPITAL COURSE:  Hyperosmolar nonketotic coma: The patient was started on insulin drip with IV fluid resuscitation with which her blood sugars improved well. She has been started on NPH insulin of 7 units twice a day instead of Humulin 70/30 that she was on at home causing hypoglycemia. In the hospital, blood sugars have ranged between 190 to 300. The patient will follow up with primary care physician for better control of blood sugars. Presently her blood sugars are okay to run mildly elevated secondary to prior episodes of hypoglycemia. The patient was also rehydrated with her kidney function improving from 2.9 to 2.1, close to her baseline.   On the day of discharge, the patient is feeling well without any concerns. Her cardiovascular examination was normal with temperature 98.4, blood pressure 129/68, and is being discharged home in a fair condition. The case was discussed with her son, on the day of discharge, who verbalized understanding.   DISCHARGE MEDICATIONS: 1.  Levothyroxine  100 mcg oral once a day.  2.  Omeprazole 20 mg oral once a day.  3.  Insulin NPH 7 units subcutaneous 2 times a day.  4.  Lasix 40 mg 1/2 tablet oral once a day.   DISCHARGE INSTRUCTIONS: Diabetic diet and check blood sugars 4 times a day before meals and at bedtime. The patient has been set up with home health with physical therapy and nursing. Follow up with primary care physician in a week.   TIME SPENT:  On day of discharge in discharge activity was 35 minutes.  ____________________________ Molinda BailiffSrikar R. Modene Andy, MD srs:sb D: 05/07/2012 13:18:11 ET T: 05/07/2012 14:10:07 ET JOB#: [id number]  cc: Wardell HeathSrikar R. Correll Denbow, MD, <Dictator> [place] A. Milinda Antisower, MD Wardell HeathSRIKAR West Bali Travor Royce MD ELECTRONICALLY SIGNED 05/10/2012 13:21

## 2014-06-13 NOTE — H&P (Signed)
PATIENT NAME:  Sara Lang, Sara Lang MR#:  [id number] DATE OF BIRTH:  06/19/1920  DATE OF ADMISSION:  05/08/2012  PRIMARY CARE PHYSICIAN:  Dana Allan, MD, at [facility]  REFERRING PHYSICIAN:  Orvilla Cornwall, MD  CHIEF COMPLAINT: Dizziness.   HISTORY OF PRESENT ILLNESS: This is an 81 year old female who was recently discharged from [facility]. The patient had complaints of dizziness and not feeling well which prompted her family to bring her back.  The patient was discharged 03/17 uncontrolled diabetes mellitus with hyperglycemia, dehydration and generalized weakness and worsening renal failure. The patient is known to have history of chronic kidney disease, diabetes mellitus, hypothyroidism, dementia with memory loss and peptic ulcer disease. The patient reports she has been feeling dizziness, mainly when she was walking. The patient reports her dizziness gets worse upon sudden turns and head movement and upon suddenly standing up. She reports she has been having this for some time, the last time around a month ago. The patient denies any other focal deficits. Denies any chest pain, any shortness of breath. The patient was not orthostatic in the ED.  The patient had basic work-up done which did show her creatinine to be around her baseline at 2.26, but she was found to have elevated troponin at 0.15, where her previous value on 03/16 was within normal limits at 0.03.  The patient denies any chest pain and shortness of breath, any sweating or palpitations. The patient's EKG did not show any new changes. Repeat troponin was done which did show trending down level at 0.1.  The patient was given 324 mg of aspirin in the ED.  The patient had a CT of the brain done in the ED which did not show any acute findings so the hospitalist service was requested to admit the patient for further work-up of her elevated troponin.   PAST MEDICAL HISTORY: 1.  Hypothyroidism.  2.  Diabetes mellitus, type  2. 3.  Gout.  4.  Chronic pain syndrome.  5.  Arthritis.  6.  Neuropathic.  7.  Gastroesophageal reflux disease.  8.  Peptic ulcer disease.  9.  Chronic kidney failure.  10.  Chronic back pain.  11.  Osteopenia.  12.  Dementia/memory loss.  PAST SURGICAL HISTORY: 1.  Cataract surgery.  2.  Cholecystectomy.  3.  Total thyroidectomy and throat surgery for a cold nodule.  4.  Tonsillectomy.  5.  Toe amputation x 2 for hammertoes. 6.  Toenail procedure.   SOCIAL HISTORY: The patient lives with her son at home who takes care of her.  Denies any history of smoking, alcohol in the past.   FAMILY HISTORY: Significant for hyperlipidemia and TIA and depression in the family.   ALLERGIES: 1.  AMOXICILLIN. 2.  PENICILLIN. 3.  PERCOCET. 4.  PERCODAN. 5.  SULFA DRUGS. 6.  LATEX.  HOME MEDICATIONS: 1.  Humulin 7 units subcutaneous 2 times a day before meals.  2.  Lasix 20 mg oral daily.  3.  Omeprazole 20 mg oral daily.  4.  Levothyroxine 100 mcg oral daily.   REVIEW OF SYSTEMS: CONSTITUTIONAL: The patient denies any fever or chills. Has generalized weakness and fatigue.  EYES: Denies blurry vision, double vision, inflammation.  ENT: Denies tinnitus, ear pain, hearing loss, epistaxis or discharge.   RESPIRATORY: Denies cough, wheezing, hemoptysis or dyspnea.  CARDIOVASCULAR: Denies any chest pain, edema, arrhythmia, palpitations, syncope. Has complaints of dizziness.  GASTROINTESTINAL: Denies nausea, vomiting, diarrhea, abdominal pain, hematemesis, rectal bleed or coffee-ground emesis.  GENITOURINARY: The patient is incontinent of urine. No reports of dysuria or hematuria.  ENDOCRINE: No polyuria, polydipsia, heat or cold intolerance.  HEMATOLOGY: Easy bruising, bleeding diathesis or history of blood clots.  INTEGUMENTARY: No acne, rash or skin lesions.  MUSCULOSKELETAL: The patient has complaints of multiple joint pains, mainly in her knees lower back pain, has history of  arthritis.  NEUROLOGICAL: The patient has a history of dementia and memory loss, no history of seizures, headache, vertigo, ataxia, focal deficits, tingling, numbness or tremors, has dizziness only on sudden movement and turning head.  Describes it as room spinning around her. PSYCHIATRIC: No history of substance or alcohol abuse, insomnia, anxiety.   PHYSICAL EXAMINATION: VITAL SIGNS: Temperature 98.2, pulse 63, respiratory rate 20, blood pressure 151/67, saturating 95% on room air.  GENERAL: Elderly female who is comfortable in bed in no apparent distress.  HEENT: Head atraumatic, normocephalic. Pupils reactive to light. Pink conjunctivae. Anicteric sclerae. Moist oral mucosa.  NECK: Supple. No thyromegaly. No JVD.  CHEST: Good air entry bilaterally. No wheezing, rales, rhonchi.  CARDIOVASCULAR: S1, S2 heard. No rubs, murmurs or gallops.  ABDOMEN: Soft, nontender, nondistended. Bowel sounds present.  EXTREMITIES: No edema. No clubbing. No cyanosis.  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  MUSCULOSKELETAL: b/l kneess swollen which is chronic. No erythema or tenderness to palpation due to severe arthritis. PSYCHIATRIC: The patient is a pleasant, awake, alert x 2.  NEUROLOGIC: Cranial nerves grossly intact. Motor 5/5. No focal deficits. Sensation is symmetrical, the patient has intact finger-to-nose test.   PERTINENT LABORATORY AND DIAGNOSTIC DATA:  Glucose 197, BUN 42, creatinine 2.26, sodium 134, potassium 3.9, chloride 99. Troponin 0.15, repeat 0.14.  White blood cells 10.3, hemoglobin 13.6, hematocrit 41.1, platelets 197.  Urinalysis trace leukocyte esterase with 3 white blood cells.  EKG showing normal sinus rhythm without significant ST or T wave changes with ventricular rate of 75.  CT brain does not show any acute findings.   ASSESSMENT AND PLAN: 1.  Elevated troponins. The patient was found to have elevated troponin at 0.15, repeat level is trending down at 0.14. The patient does not  have any EKG changes, as well, no complaints of chest pain or shortness of breath, given 324 mg of aspirin. The patient had recent admission with dehydration and worsening renal failure, elevated troponin might be attributed to demand ischemia from her previous sickness. Unlikely acute coronary syndrome.  We will continue to cycle her troponins. Will check echocardiogram in a.m. We will check, as well, lipid panel. We will consult cardiology service terminal ileum see if there is any further work-up indicated here. We will hold on starting intravenous heparin drip unless her repeat cardiac enzymes are trending up.  2.  Dizziness. The patient is not orthostatic in the Emergency Department, has no focal deficits. Repeat CT brain does not show any acute finding, appears to be due to benign positional vertigo. We will have her on p.r.n. meclizine.  As well, we will check her TSH.  3.  Diabetes mellitus. Will start the patient on insulin sliding scale while inpatient. 4.  Hypothyroidism. Continue with Synthroid.  5.  Gastroesophageal reflux disease. Continue with proton pump inhibitor.  6.  Deep vein thrombosis prophylaxis. Subcutaneous heparin.   CODE STATUS: The patient is DO NOT RESUSCITATE as per her son who is at bedside.   Total time spent on admission and patient care: 55 minutes.    ____________________________ Starleen Arms, MD dse:ct D: 05/08/2012 04:50:05 ET T: 05/08/2012  07:58:58 ET JOB#: [id number]  cc: Starleen Armsawood S. Zackariah Vanderpol, MD, <Dictator> Marwan T. Powers, MD Alfonza Toft Teena IraniS Destiney Sanabia MD ELECTRONICALLY SIGNED 05/09/2012 0:25

## 2014-06-13 NOTE — Consult Note (Signed)
General Aspect 81 year old female with a history of dementia, hypothyroidism, arthritis and deconditioning who lives with her son, used to be on Hospice care in the past, previously admitted with  generalized weakness, hyperglycemia, now presenting with confusion and bradycardia.  Patient is a poor historian. Notes indicate the patient was been getting progressively more confused over the past few days. Sons brought her in to the hospital. Clonidine given for HTN. She take metoprolol at home.  In the ER, noted to have severe HTN. Bradycardia.    Previous admission, had profound weakness and inability to walk.  Found to have elevated glucose at 611.  she was on Humulin 70/30, but she has been off insulin for the last 6 months.  found to have a creatinine of 2.3, which appears to be around her baseline in the past. sodium of 127. She was managed medically and d/c home.  she got home but had "spinning"/dizziness, some nausea. She presented back to the hospital, found to have a troponin of 0.15. She was admitted for elevated cardiac enz and possible NSTEMI, dizziness/spinning.She ruled out, medical management.   Repeat troponin 0.14. Still with "spinning" in bed this AM. Denies having this before. Different son at the bedside. Indicated he does not want significant cardiac workup. He not the POA per nursing. Son also reports that we should not use b-blockers and be careful with other meds as she is sensitive. "No pain meds".   Present Illness . PSYCHOSOCIAL HISTORY: The patient lives with her son. No history of smoking, alcohol or illicit drug usage.   FAMILY HISTORY: TIA and hyperlipidemia, as well as depression runs in her family.   Physical Exam:  GEN well developed, well nourished, no acute distress   HEENT pink conjunctivae   NECK supple   RESP normal resp effort  clear BS   CARD Regular rate and rhythm  No murmur   ABD denies tenderness  soft   LYMPH negative neck   EXTR negative  edema   SKIN normal to palpation   NEURO motor/sensory function intact   PSYCH alert   Review of Systems:  Subjective/Chief Complaint no complaints apart from fuzzy in the head, posterior neck pain   General: Weakness   Skin: No Complaints   ENT: No Complaints   Eyes: No Complaints   Neck: No Complaints   Respiratory: No Complaints   Cardiovascular: No Complaints   Gastrointestinal: No Complaints   Genitourinary: No Complaints   Vascular: No Complaints   Musculoskeletal: No Complaints   Neurologic: No Complaints   Hematologic: No Complaints   Endocrine: No Complaints   Psychiatric: No Complaints   Review of Systems: All other systems were reviewed and found to be negative   Medications/Allergies Reviewed Medications/Allergies reviewed     Dementia:    renal insufficiency:    gout:    Alzheimer's Disease:    GERD - Esophageal Reflux:    osteoarthritis:    Asthma:    CHF:    Hypertension:    Hypercholesterolemia:    Diabetes Mellitus,Type I (IDD):    Cholecystectomy:    Appendectomy:    bilat 2nd toe amputation:    Thyroidectomy:        Admit Diagnosis:   AMS: Onset Date: 30-May-2012, Status: Active, Description: AMS  Home Medications: Medication Instructions Status  metoprolol 25 mg oral tablet, extended release 1 tab(s) orally once a day Active  meclizine 12.5 mg oral tablet 1 tab(s) orally every 6 hours, As Needed, dizziness ,  As needed, dizziness Active  levothyroxine 100 mcg (0.1 mg) oral tablet 1 tab(s) orally once a day Active  omeprazole 20 mg oral delayed release capsule 1 cap(s) orally once a day Active  ranitidine 150 mg oral tablet 1 tab(s) orally 2 times a day Active  Lasix 20 mg oral tablet 1 tab(s) orally once a day Active  HumuLIN N human recombinant 100 units/mL subcutaneous suspension  subcutaneous 3 times a day sliding scale Active   Lab Results:  Hepatic:  08-Apr-14 22:21   Bilirubin, Total 0.4  Alkaline  Phosphatase 99  SGPT (ALT) 21  SGOT (AST) 29  Total Protein, Serum 6.5  Albumin, Serum 3.6  Routine Chem:  08-Apr-14 22:21   Glucose, Serum  172  BUN  30  Creatinine (comp)  1.42  Sodium, Serum 139  Potassium, Serum 3.9  Chloride, Serum 104  CO2, Serum 28  Calcium (Total), Serum 8.5  Osmolality (calc) 288  eGFR (African American)  38  eGFR (Non-African American)  33 (eGFR values <10mL/min/1.73 m2 may be an indication of chronic kidney disease (CKD). Calculated eGFR is useful in patients with stable renal function. The eGFR calculation will not be reliable in acutely ill patients when serum creatinine is changing rapidly. It is not useful in  patients on dialysis. The eGFR calculation may not be applicable to patients at the low and high extremes of body sizes, pregnant women, and vegetarians.)  Result Comment POTASSIUM/AST - Slight hemolysis, interpret results with  - caution.  Result(s) reported on 29 May 2012 at 10:50PM.  Anion Gap 7  Cardiac:  08-Apr-14 22:21   Troponin I < 0.02 (0.00-0.05 0.05 ng/mL or less: NEGATIVE  Repeat testing in 3-6 hrs  if clinically indicated. >0.05 ng/mL: POTENTIAL  MYOCARDIAL INJURY. Repeat  testing in 3-6 hrs if  clinically indicated. NOTE: An increase or decrease  of 30% or more on serial  testing suggests a  clinically important change)  Routine Hem:  08-Apr-14 22:21   WBC (CBC) 8.0  RBC (CBC) 4.10  Hemoglobin (CBC) 13.7  Hematocrit (CBC) 39.6  Platelet Count (CBC) 215 (Result(s) reported on 29 May 2012 at 10:48PM.)  MCV 97  MCH 33.3  MCHC 34.4  RDW 14.0   EKG:  Interpretation EKG shows sinus bradycardia, rate 49 bpm, no significant ST or T wave changes    Percocet: Alt Ment Status  Percodan: Alt Ment Status  Amoxicillin: Rash  Penicillin: Rash  Sulfa drugs: GI Distress, Rash  Latex: Itching  NSAIDS: Other  Vital Signs/Nurse's Notes: **Vital Signs.:   09-Apr-14 09:47  Vital Signs Type Q 4hr  Temperature  Temperature (F) 97.8  Celsius 36.5  Temperature Source oral  Pulse Pulse 53  Respirations Respirations 20  Systolic BP Systolic BP 397  Diastolic BP (mmHg) Diastolic BP (mmHg) 79  Mean BP 109  Pulse Ox % Pulse Ox % 99  Oxygen Delivery Room Air/ 21 %    Impression 81 year old female with a history of dementia, hypothyroidism, arthritis and deconditioning who lives with her son, used to be on Hospice care in the past, previously admitted with  generalized weakness, hyperglycemia, now presenting with confusion and bradycardia.  1) Bradycardia suspect sick sinus syndrome, exacerbated by metoprolol and clonidine Would hold metoprolol/clonidine For blood pressure, could start amlodipine 5 mg po BID with hold parameters Could use ACE or ARB if renal function improves (could be started as an outpt if needed) Monitor HR for now, no pacer indicated as no sx and BP stable.  2) HTN: as above will need to monitor as an outpt as well.  3) Confusion/mrental status changes UTI, on ABX  4) Diabetes: Would continue outpt meds,  per medical service   Electronic Signatures: Ida Rogue (MD)  (Signed 09-Apr-14 12:46)  Authored: General Aspect/Present Illness, History and Physical Exam, Review of System, Past Medical History, Health Issues, Home Medications, Labs, EKG , Allergies, Vital Signs/Nurse's Notes, Impression/Plan   Last Updated: 09-Apr-14 12:46 by Ida Rogue (MD)

## 2014-06-14 NOTE — Discharge Summary (Signed)
PATIENT NAME:  Sara Lang, Sara Lang MR#:  [id number] DATE OF BIRTH:  19-Mar-1920  DATE OF ADMISSION:  02/14/2013 DATE OF DISCHARGE:  02/16/2013  DISCHARGE DIAGNOSES: 1.  Hypoglycemia.  2.  Diabetes.  3.  Hypertension.  4.  Electrolyte imbalance.  5.  Urinary tract infection prophylaxis.  6.  Acute on chronic renal failure.  CODE STATUS: FULL CODE.   MEDICATIONS ON DISCHARGE: 1.  Levothyroxine 100 mcg oral tablet once a day.  2.  Meclizine 12.5 mg oral tablet every 6 hours as needed for dizziness.  3.  Ranitidine 150 mg oral tablet 2 times a day.  4.  Amlodipine 5 mg every 12 hours.  5.  Humulin-N subcutaneous 2 times a day for sliding scale coverage.  6.  Insulin detemir 10 units subcutaneous once a day.  7.  Cipro 250 mg every 12 hours for 4 days.  DIET ON DISCHARGE:  Carbohydrate-controlled ADA diet, regular consistency.  ACTIVITY: Advised to weight bear as tolerated activity for 2 to 4 weeks.  FOLLOWUP:  With primary care physician to check kidney function with Dr. Milinda Antisower, [place].  HISTORY OF PRESENT ILLNESS: On the 25th December by Dr. Heron NayVasireddy, 81 year old pleasant female with past medical history of diabetes, hypertension, hyperlipidemia, lives with son, presented to the Emergency Room with complaint of uncontrolled blood sugar. Usually blood sugar was 150 to 250. She started experiencing symptoms and blood sugar is less than 160. In the morning the patient was extremely lethargic and excessive drinking, so checked the sugar which was found to be more than 600 on glucometer. The patient had similar episode in the past with urinary tract infection. Concerning this, she was brought to the Emergency Room and blood sugar was found 990 with elevated BUN and creatinine level. The patient's son denied any change in her diet or medication, so she was admitted for further management of hypoglycemia.  HOSPITAL COURSE AND STAY: Initially she was admitted for hypoglycemia with insulin drip and IV  fluids in Critical Care Unit. Blood sugar level came nicely under control and so insulin drip stopped and spoke to the son for further details to find out the cause of hypoglycemia and he said that the patient needs a small amount of insulin, not much every day, so he keeps the vial in the refrigerator for a few months and keep on using from the same vial. I asked him to discuss with the pharmacist about this, how long we can use the vial after opening and that was 3 to 4 weeks. The son was using it for more than a few months and so that might seem to be the reason of having hypoglycemia. I explained to him and he understood that and agreed to follow that from now onwards. The patient's blood sugar was also remaining slightly on the high side, so started on some long-acting insulin also and advised the son to start giving that.   OTHER MEDICAL ISSUES: 1.  Acute on chronic renal failure, improved with IV fluid. Baseline creatinine was 1.4, and came to 1.8 after fluid resuscitation.  2.  Electrolyte imbalance, hypokalemia and hypomagnesemia, replaced.  3.  Hypertension, given amlodipine.  4.  Hypothyroidism. Levothyroxine was given.  IMPORTANT LABORATORY RESULTS IN THE HOSPITAL: Blood sugar level was more than 600 on fingerstick which was found 990 on BMP. Creatinine was 2.67, sodium 123 and potassium 3.8. White cell count was 11.2 and hemoglobin was 14.6 on admission. Urinalysis was grossly negative. Chest x-ray, portable, was done which showed  no acute cardiopulmonary disease. Creatinine improved to 1.8 on the 26th. Urine culture remained negative and TSH was 2.67. HbA1c was 12.5. Blood sugar after starting long-acting insulin remained in the range of 150 to 250.  TOTAL TIME SPENT ON THIS DISCHARGE:  40 minutes   ____________________________ Hope Pigeon. Elisabeth Pigeon, MD vgv:ce D: 02/20/2013 12:02:25 ET T: 02/20/2013 12:22:44 ET JOB#: [id number]  cc: Hope Pigeon. Elisabeth Pigeon, MD, <Dictator> Marne A.  Milinda Antis, MD Heath Gold [facility] MD ELECTRONICALLY SIGNED 02/24/2013 21:59

## 2014-06-14 NOTE — H&P (Signed)
PATIENT NAME:  Sara Lang, Sara Lang MR#:  [id number] DATE OF BIRTH:  16-May-1920  DATE OF ADMISSION:  02/14/2013  PRIMARY CARE PHYSICIAN:  Dr. Roxy Manns.   REFERRING PHYSICIAN:  Dr. Margarita Grizzle.   CHIEF COMPLAINT:  High blood sugars.   HISTORY OF PRESENT ILLNESS:  Sara Lang is a 81 year old pleasant female with a past medical history of diabetes mellitus, hypertension, hyperlipidemia, who lives with her son who is a medical power of attorney, presented to the Emergency Department with complaints of uncontrolled blood sugars.  The patient usually has blood sugars around 150 to 250.  The patient usually starts experiencing symptoms of hyperglycemia when the blood sugars are less than 160.  Since this morning, the patient has been extremely lethargic, has been excessively drinking and urinating.  The patient usually walks with the help of her walker.  However, today the patient was unable to stand up.  Concerning this, checked her blood sugar which was found to be greater than 600 on the glucometer.  The patient had a similar episode of high blood sugars when the patient had a urinary tract infection.  Concerning this, the patient is brought to the Emergency Department.  Work-up in the Emergency Department, the patient was found to have blood sugars of 990.  The patient has elevated BUN and creatinine.  The patient's son denies any change in her diet, medication.  Denies having any dysuria.  She had a workup in the Emergency Department.  The patient does not have any urinary tract infection.  Chest x-ray does not indicate any infiltrates.  The patient has mild elevated WBC count of 11,000.  Denies having any abdominal pain, nausea, vomiting.   PAST MEDICAL HISTORY: 1.  Hypertension.  2.  Diabetes mellitus, type 2.  3.  Gout.  4.  Chronic pain syndrome.  5.  Osteoarthritis.  6.  Neuropathy.   7.  Gastroesophageal reflux disease.  8.  Peptic ulcer disease. 9.  Chronic kidney disease.  10.  Chronic back pain.   11.  Osteopenia.  12.  Dementia.  13.  Hypertension.   PAST SURGICAL HISTORY: 1.  Cholecystectomy.  2.  Tonsillectomy.  3.  Total thyroidectomy.  4.  Throat surgery for a nodule.  5.  Toe amputation.  6.  Toenail procedure.  7.  Cataract surgery.   ALLERGIES:  1.  AMOXICILLIN.  2.  NSAIDS.  3.  PENICILLIN.  4.  PERCOCET.  5.  SULFA DRUGS.  6.  LATEX.  7.  PERCODAN.  HOME MEDICATIONS: 1.  Ranitidine 150 mg two times a day.  2.  Omeprazole 20 mg daily.  3.  Meclizine 12.5 mg every six hours as needed.  4.  Levothyroxine 100 mcg once a day.  5.  Levaquin 250 mg every 24 hours.  6.  Lasix 20 mg once a day.  7.  Humulin N subcutaneous three times a day.  8.  Amlodipine 5 mg 1 tablet every 12 hours.   SOCIAL HISTORY:  No history of smoking, drinking alcohol or using illicit drugs.  Currently lives with her son who takes care of this patient very closely.   FAMILY HISTORY:  Noncontributory at the age of 71; however, has history of TIA, hypothyroidism, depression in the family.   REVIEW OF SYSTEMS:  The patient is pleasantly confused, however found to be negative except as mentioned in the history of present illness.   PHYSICAL EXAMINATION: GENERAL:  This is a well-built, well-nourished, age-appropriate female lying down in the bed,  not in distress.  VITAL SIGNS:  Temperature 98.2, pulse 74, blood pressure 139/77, respiratory rate of 18, oxygen saturation is 97% on room air.  HEENT:  Head normocephalic, atraumatic.  Eyes, no scleral icterus.  Conjunctivae normal.  Pupils equal and react to light.  Extraocular movements are intact.  Mucous membranes moist.  No pharyngeal erythema.  NECK:  Supple.  No lymphadenopathy.  No JVD.  No carotid bruit.  CHEST:  Has no focal tenderness.  LUNGS:  Bilaterally clear to auscultation.  HEART:  S1 and S2 regular.  No murmurs are heard.  No pedal edema.  Pulses 2+.  ABDOMEN:  Bowel sounds plus.  Soft, nontender, nondistended.  SKIN:  No  rashes or lesions.  MUSCULOSKELETAL:  Good range of motion in all the extremities.  NEUROLOGIC:  The patient is alert, oriented to self, place, but not to time.  No apparent cranial nerve abnormalities.  Motor 5 by 5 in upper and lower extremities.   LABORATORY DATA:  Chest x-ray portable:  No acute cardiopulmonary disease.  UA negative for nitrites and leukocyte esterase.  Magnesium 1.5.  Troponin less than 0.02.  CMP:  Glucose 990, BUN 32, creatinine of 2.67.   ASSESSMENT AND PLAN:  Sara Lang is a 81 year old female who comes to the Emergency Department with uncontrolled blood sugars.  1.  Hyperglycemia.  Does not have any precipitating factors.  No obvious signs of any infection.  The patient denies any recent change in medication.  No change in the diet.  However, considering the patient's high blood sugars, keep the patient on stepdown unit of the ICU.  We will start the patient on insulin drip.  Follow up with Accu-Cheks q. 1 hour.  2.  Acute on chronic renal insufficiency.  The patient's baseline creatinine of 1.6.  This is a significant increase from the baseline.  Continue with IV fluids and follow up, most likely this is from the prerenal.  3.  Hypertension:  Currently well-controlled.  Continue the home medications.  4.  Hypomagnesemia.  We will replace by IV.  5.  Keep the patient on deep vein thrombosis prophylaxis with Lovenox.   TIME SPENT:  50 minutes.    ____________________________ Susa GriffinsPadmaja Joscelin Fray, MD pv:ea D: 02/15/2013 01:13:35 ET T: 02/15/2013 02:41:34 ET JOB#: [id number]  cc: Susa GriffinsPadmaja Joletta Manner, MD, <Dictator> Marne A. Milinda Antisower, MD Clerance LavPADMAJA Aneyah Lortz MD ELECTRONICALLY SIGNED 03/01/2013 21:15

## 2014-06-18 DIAGNOSIS — M1990 Unspecified osteoarthritis, unspecified site: Secondary | ICD-10-CM | POA: Diagnosis not present

## 2014-06-18 DIAGNOSIS — F068 Other specified mental disorders due to known physiological condition: Secondary | ICD-10-CM | POA: Diagnosis not present

## 2014-06-18 DIAGNOSIS — E875 Hyperkalemia: Secondary | ICD-10-CM | POA: Diagnosis not present

## 2014-06-18 DIAGNOSIS — E119 Type 2 diabetes mellitus without complications: Secondary | ICD-10-CM | POA: Diagnosis not present

## 2014-06-18 DIAGNOSIS — Z794 Long term (current) use of insulin: Secondary | ICD-10-CM | POA: Diagnosis not present

## 2014-06-18 DIAGNOSIS — E538 Deficiency of other specified B group vitamins: Secondary | ICD-10-CM | POA: Diagnosis not present

## 2014-06-18 DIAGNOSIS — I10 Essential (primary) hypertension: Secondary | ICD-10-CM | POA: Diagnosis not present

## 2014-06-20 DIAGNOSIS — E538 Deficiency of other specified B group vitamins: Secondary | ICD-10-CM | POA: Diagnosis not present

## 2014-06-20 DIAGNOSIS — E875 Hyperkalemia: Secondary | ICD-10-CM | POA: Diagnosis not present

## 2014-06-20 DIAGNOSIS — I10 Essential (primary) hypertension: Secondary | ICD-10-CM | POA: Diagnosis not present

## 2014-06-20 DIAGNOSIS — Z794 Long term (current) use of insulin: Secondary | ICD-10-CM | POA: Diagnosis not present

## 2014-06-20 DIAGNOSIS — M1990 Unspecified osteoarthritis, unspecified site: Secondary | ICD-10-CM | POA: Diagnosis not present

## 2014-06-20 DIAGNOSIS — E119 Type 2 diabetes mellitus without complications: Secondary | ICD-10-CM | POA: Diagnosis not present

## 2014-06-20 DIAGNOSIS — F068 Other specified mental disorders due to known physiological condition: Secondary | ICD-10-CM | POA: Diagnosis not present

## 2014-06-24 DIAGNOSIS — E538 Deficiency of other specified B group vitamins: Secondary | ICD-10-CM | POA: Diagnosis not present

## 2014-06-24 DIAGNOSIS — M1990 Unspecified osteoarthritis, unspecified site: Secondary | ICD-10-CM | POA: Diagnosis not present

## 2014-06-24 DIAGNOSIS — F068 Other specified mental disorders due to known physiological condition: Secondary | ICD-10-CM | POA: Diagnosis not present

## 2014-06-24 DIAGNOSIS — Z794 Long term (current) use of insulin: Secondary | ICD-10-CM | POA: Diagnosis not present

## 2014-06-24 DIAGNOSIS — E875 Hyperkalemia: Secondary | ICD-10-CM | POA: Diagnosis not present

## 2014-06-24 DIAGNOSIS — I10 Essential (primary) hypertension: Secondary | ICD-10-CM | POA: Diagnosis not present

## 2014-06-24 DIAGNOSIS — E119 Type 2 diabetes mellitus without complications: Secondary | ICD-10-CM | POA: Diagnosis not present

## 2014-06-25 ENCOUNTER — Telehealth: Payer: Self-pay | Admitting: Family Medicine

## 2014-06-25 DIAGNOSIS — Z794 Long term (current) use of insulin: Secondary | ICD-10-CM | POA: Diagnosis not present

## 2014-06-25 DIAGNOSIS — E538 Deficiency of other specified B group vitamins: Secondary | ICD-10-CM | POA: Diagnosis not present

## 2014-06-25 DIAGNOSIS — I10 Essential (primary) hypertension: Secondary | ICD-10-CM | POA: Diagnosis not present

## 2014-06-25 DIAGNOSIS — R0602 Shortness of breath: Secondary | ICD-10-CM | POA: Diagnosis not present

## 2014-06-25 DIAGNOSIS — E875 Hyperkalemia: Secondary | ICD-10-CM | POA: Diagnosis not present

## 2014-06-25 DIAGNOSIS — E119 Type 2 diabetes mellitus without complications: Secondary | ICD-10-CM | POA: Diagnosis not present

## 2014-06-25 DIAGNOSIS — F068 Other specified mental disorders due to known physiological condition: Secondary | ICD-10-CM | POA: Diagnosis not present

## 2014-06-25 DIAGNOSIS — M1990 Unspecified osteoarthritis, unspecified site: Secondary | ICD-10-CM | POA: Diagnosis not present

## 2014-06-25 NOTE — Telephone Encounter (Signed)
Traci from [facility] called. She went to see pt today, and pt reported that she had a fall 2 days ago.  Pt and caregiver report no injuries.  Pt was getting up, trying to get to walker and fell.  [facility] wanted to make you aware. Thanks.

## 2014-06-25 NOTE — Telephone Encounter (Signed)
Aware-keep me posted  Family has been ed re: fall prev

## 2014-06-26 ENCOUNTER — Telehealth: Payer: Self-pay

## 2014-06-26 DIAGNOSIS — F068 Other specified mental disorders due to known physiological condition: Secondary | ICD-10-CM | POA: Diagnosis not present

## 2014-06-26 DIAGNOSIS — M1990 Unspecified osteoarthritis, unspecified site: Secondary | ICD-10-CM | POA: Diagnosis not present

## 2014-06-26 DIAGNOSIS — E119 Type 2 diabetes mellitus without complications: Secondary | ICD-10-CM | POA: Diagnosis not present

## 2014-06-26 DIAGNOSIS — E538 Deficiency of other specified B group vitamins: Secondary | ICD-10-CM | POA: Diagnosis not present

## 2014-06-26 DIAGNOSIS — Z794 Long term (current) use of insulin: Secondary | ICD-10-CM | POA: Diagnosis not present

## 2014-06-26 DIAGNOSIS — E875 Hyperkalemia: Secondary | ICD-10-CM | POA: Diagnosis not present

## 2014-06-26 DIAGNOSIS — I10 Essential (primary) hypertension: Secondary | ICD-10-CM | POA: Diagnosis not present

## 2014-06-26 NOTE — Telephone Encounter (Signed)
Debbie @ [facility] called Eunice BlaseDebbie wants to know if pt needs to come in or can you call something in for her  Eunice BlaseDebbie would like a call back [phone]

## 2014-06-26 NOTE — Telephone Encounter (Signed)
Is she sick? What symptoms? Has she been exposed?

## 2014-06-26 NOTE — Telephone Encounter (Signed)
Spoke with Debbie and [facility] can do the c diff test so pt or son doesn't have to come out to pick up stool test. Eunice Blaseebbie will have nurse collect the sample and they will fax us over the results when they are available

## 2014-06-26 NOTE — Telephone Encounter (Signed)
Debbie with [facility] said pt has really bad Diarrhea that "smells like c diff" debbie said pt hasn't been exposed to anything she is aware of and she has no other sxs except the bad diarrhea

## 2014-06-26 NOTE — Telephone Encounter (Signed)
Dom pts son left v/m requesting cb;Dom has contacted [facility] about testing or treatment for c diff. And wanted to be sure Dr Milinda Antisower was made aware.

## 2014-06-26 NOTE — Telephone Encounter (Signed)
We will need a stool sample to test for it - if they want to come pick that up and bring it back that would be fine Please order for dx diarrhea

## 2014-06-27 DIAGNOSIS — E119 Type 2 diabetes mellitus without complications: Secondary | ICD-10-CM | POA: Diagnosis not present

## 2014-06-27 DIAGNOSIS — E875 Hyperkalemia: Secondary | ICD-10-CM | POA: Diagnosis not present

## 2014-06-27 DIAGNOSIS — F068 Other specified mental disorders due to known physiological condition: Secondary | ICD-10-CM | POA: Diagnosis not present

## 2014-06-27 DIAGNOSIS — M1990 Unspecified osteoarthritis, unspecified site: Secondary | ICD-10-CM | POA: Diagnosis not present

## 2014-06-30 ENCOUNTER — Telehealth: Payer: Self-pay | Admitting: Family Medicine

## 2014-06-30 NOTE — Telephone Encounter (Signed)
Son called in wanting to speak with Dr Milinda Antisower, regarding mom.  Son is having concerns with care of nurses.  He said all they do is take bp, check hr, and they leave.  He said CNA are goo, he said PT is good. Nurses are not checking her. They do not check her feet, she has bruised on her.   He states that she is failing, he is wondering if she qualifies for hospice.  He is wanting to switch home health care providers to [facility] if she isn't a candidate for hospice.  Please call Roe CoombsDon back to discuss this further.  The best number is [phone].

## 2014-06-30 NOTE — Telephone Encounter (Signed)
Sara Lang- do you think she would qualify for hospice based on her problem list? She is homebound.  She was in hospice care a number of years ago and was d/c after doing better, now she is older and not as apt to rebound.  Thanks

## 2014-07-01 NOTE — Telephone Encounter (Signed)
Please see prev note from Sara Lang - that co no longer does home health- just hospice , and they do not think she would qualify for that at this time (she spoke to them about her case) Also - hospice would not be able to provide some of the personal care that home health provides including PT  Not many options currently  I know she wants to continue staying at home as long as possible

## 2014-07-01 NOTE — Telephone Encounter (Signed)
I called [facility] and spoke with Gavin Poundeborah, I told her about Ms Sara Lang and that she was currently getting [facility] in her home for PT and ADL's. She said that if the patient is eating and drinking and walking then she probably wouldn't meet criteria for Hospice at this time. If you want them to go for an assessment then [facility] would have to stop going because you cannot have both HH and  at the same time.Also [facility] doesn't do Home Health anymore just Hospice so they cant change Home Health to them either.

## 2014-07-03 NOTE — Telephone Encounter (Signed)
Called son and no answer and no voicemail set up

## 2014-07-03 NOTE — Telephone Encounter (Signed)
Son notified of [place]/Dr. Royden Purlower's comments. Pt's son said he will keep looking to see if there is another Home health provider that pt would qualify for and let us know

## 2014-07-04 ENCOUNTER — Encounter: Payer: Self-pay | Admitting: Family Medicine

## 2014-07-04 DIAGNOSIS — I509 Heart failure, unspecified: Secondary | ICD-10-CM | POA: Insufficient documentation

## 2014-07-04 DIAGNOSIS — G4734 Idiopathic sleep related nonobstructive alveolar hypoventilation: Secondary | ICD-10-CM | POA: Insufficient documentation

## 2014-07-05 DIAGNOSIS — I5020 Unspecified systolic (congestive) heart failure: Secondary | ICD-10-CM | POA: Diagnosis not present

## 2014-07-10 ENCOUNTER — Telehealth: Payer: Self-pay | Admitting: Family Medicine

## 2014-07-10 NOTE — Telephone Encounter (Signed)
[facility]

## 2014-07-10 NOTE — Telephone Encounter (Signed)
[facility] called with request for verbal orders for Physical therapy to make up for one day that she missed this week. Please call [phone] [facility]

## 2014-07-10 NOTE — Telephone Encounter (Signed)
Please verbally ok that  

## 2014-07-25 ENCOUNTER — Telehealth: Payer: Self-pay

## 2014-07-25 NOTE — Telephone Encounter (Signed)
Debbie nurse with [facility] left v/m; Eunice BlaseDebbie said pt has not been seen all week by home health; unable to reach pts son until 07/24/14 and was supposed to see pt today but could not see pt today due to pts son saying today was not a good day. FYI for Dr Milinda Antisower.

## 2014-07-26 NOTE — Telephone Encounter (Signed)
Thanks for letting me know - please check in with him and make sure everything is all right

## 2014-07-28 ENCOUNTER — Telehealth: Payer: Self-pay

## 2014-07-28 ENCOUNTER — Other Ambulatory Visit: Payer: Medicare Other
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
## 2014-07-28 NOTE — Telephone Encounter (Signed)
If she cannot be forced to come in- that is a difficult situation for sure  It sounds like from a safety perspective -they may need to consider nursing home care for her but I know they are strongly opposed to this  I do not know if hiring a private caregiver is an option for them  I did give them the name of the woman from [facility] - who does free consults re; advice when caring for dementia patients (she does free consultations- not an acutal hospice service) and I cannot remember if she has met with Dom or was helpful
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
## 2014-07-28 NOTE — Telephone Encounter (Signed)
She probably needs to be seen by Dr. Milinda Antisower. Can she make an appointment with PCP this week?

## 2014-07-28 NOTE — Telephone Encounter (Signed)
Addressed through another phone note 

## 2014-07-29 ENCOUNTER — Telehealth: Payer: Self-pay | Admitting: Family Medicine

## 2014-07-29 ENCOUNTER — Emergency Department
Admission: EM | Admit: 2014-07-29 | Discharge: 2014-07-29 | Disposition: A | Discharge status: Home / Self Care | Payer: Medicare Other | Attending: Emergency Medicine | Admitting: Emergency Medicine

## 2014-07-29 ENCOUNTER — Emergency Department: Payer: Medicare Other

## 2014-07-29 DIAGNOSIS — N189 Chronic kidney disease, unspecified: Secondary | ICD-10-CM | POA: Insufficient documentation

## 2014-07-29 DIAGNOSIS — T07XXXA Unspecified multiple injuries, initial encounter: Secondary | ICD-10-CM

## 2014-07-29 DIAGNOSIS — Y9289 Other specified places as the place of occurrence of the external cause: Secondary | ICD-10-CM | POA: Insufficient documentation

## 2014-07-29 DIAGNOSIS — M25512 Pain in left shoulder: Secondary | ICD-10-CM | POA: Diagnosis not present

## 2014-07-29 DIAGNOSIS — S0993XA Unspecified injury of face, initial encounter: Secondary | ICD-10-CM | POA: Diagnosis not present

## 2014-07-29 DIAGNOSIS — I129 Hypertensive chronic kidney disease with stage 1 through stage 4 chronic kidney disease, or unspecified chronic kidney disease: Secondary | ICD-10-CM | POA: Insufficient documentation

## 2014-07-29 DIAGNOSIS — Z880 Allergy status to penicillin: Secondary | ICD-10-CM | POA: Insufficient documentation

## 2014-07-29 DIAGNOSIS — W01198A Fall on same level from slipping, tripping and stumbling with subsequent striking against other object, initial encounter: Secondary | ICD-10-CM | POA: Diagnosis not present

## 2014-07-29 DIAGNOSIS — Y9301 Activity, walking, marching and hiking: Secondary | ICD-10-CM | POA: Insufficient documentation

## 2014-07-29 DIAGNOSIS — S4992XA Unspecified injury of left shoulder and upper arm, initial encounter: Secondary | ICD-10-CM | POA: Insufficient documentation

## 2014-07-29 DIAGNOSIS — S0033XA Contusion of nose, initial encounter: Secondary | ICD-10-CM | POA: Insufficient documentation

## 2014-07-29 DIAGNOSIS — S79912A Unspecified injury of left hip, initial encounter: Secondary | ICD-10-CM | POA: Diagnosis not present

## 2014-07-29 DIAGNOSIS — Z91040 Latex allergy status: Secondary | ICD-10-CM | POA: Diagnosis not present

## 2014-07-29 DIAGNOSIS — S0012XA Contusion of left eyelid and periocular area, initial encounter: Secondary | ICD-10-CM | POA: Diagnosis not present

## 2014-07-29 DIAGNOSIS — S0083XA Contusion of other part of head, initial encounter: Secondary | ICD-10-CM | POA: Diagnosis not present

## 2014-07-29 DIAGNOSIS — E119 Type 2 diabetes mellitus without complications: Secondary | ICD-10-CM | POA: Diagnosis not present

## 2014-07-29 DIAGNOSIS — Y998 Other external cause status: Secondary | ICD-10-CM | POA: Diagnosis not present

## 2014-07-29 DIAGNOSIS — Z794 Long term (current) use of insulin: Secondary | ICD-10-CM | POA: Diagnosis not present

## 2014-07-29 DIAGNOSIS — S00511A Abrasion of lip, initial encounter: Secondary | ICD-10-CM | POA: Diagnosis not present

## 2014-07-29 DIAGNOSIS — S0990XA Unspecified injury of head, initial encounter: Secondary | ICD-10-CM | POA: Diagnosis not present

## 2014-07-29 DIAGNOSIS — Z79899 Other long term (current) drug therapy: Secondary | ICD-10-CM | POA: Diagnosis not present

## 2014-07-29 DIAGNOSIS — W19XXXA Unspecified fall, initial encounter: Secondary | ICD-10-CM

## 2014-07-29 DIAGNOSIS — S199XXA Unspecified injury of neck, initial encounter: Secondary | ICD-10-CM | POA: Diagnosis not present

## 2014-07-29 NOTE — ED Notes (Signed)
Pt witnessed fall yesterday by brother. Pt hit head on oak table, bruising to chin, left hip pain and left shoulder pain. Caregiver reports that pt eyes are pinpoint. Hx of dementia.

## 2014-07-29 NOTE — Telephone Encounter (Signed)
Thanks - I will review her records-hope she is doing better

## 2014-07-29 NOTE — Discharge Instructions (Signed)
Contusion °A contusion is a deep bruise. Contusions happen when an injury causes bleeding under the skin. Signs of bruising include pain, puffiness (swelling), and discolored skin. The contusion may turn blue, purple, or yellow. °HOME CARE  °· Put ice on the injured area. °¨ Put ice in a plastic bag. °¨ Place a towel between your skin and the bag. °¨ Leave the ice on for 15-20 minutes, 03-04 times a day. °· Only take medicine as told by your doctor. °· Rest the injured area. °· If possible, raise (elevate) the injured area to lessen puffiness. °GET HELP RIGHT AWAY IF:  °· You have more bruising or puffiness. °· You have pain that is getting worse. °· Your puffiness or pain is not helped by medicine. °MAKE SURE YOU:  °· Understand these instructions. °· Will watch your condition. °· Will get help right away if you are not doing well or get worse. °Document Released: 07/27/2007 Document Revised: 05/02/2011 Document Reviewed: 12/13/2010 °[employer]® Patient Information ©2015 [employer]. This information is not intended to replace advice given to you by your health care provider. Make sure you discuss any questions you have with your health care provider. ° °

## 2014-07-29 NOTE — ED Notes (Signed)
MD at bedside. 

## 2014-07-29 NOTE — Telephone Encounter (Signed)
Patient's son called to let you know patient was [facility].

## 2014-07-29 NOTE — Telephone Encounter (Signed)
Dom advised.  Dom states that once she is stronger and recovers some from the fall, he will call for an appointment.

## 2014-07-29 NOTE — ED Notes (Signed)
Patient transported to X-ray 

## 2014-07-29 NOTE — ED Provider Notes (Signed)
[facility] Emergency Department Provider Note  Time seen: 8:46 AM  I have reviewed the triage vital signs and the nursing notes.   HISTORY  Chief Complaint Fall and Head Injury    HPI Sara Lang is a 81 y.o. female with multiple medical problems including diabetes, gout, hyperlipidemia, hypertension, dementia, arthritis, CK D, chronic pain presents to the emergency department after a fall last night. According to the son the patient was walking when she lost her balance and tripped following with her walker, and hitting an oak coffee table.  Son states the patient appeared fairly well last night so they did not seek medical care, however upon awakening this morning he noticed bruising to her face along with complaints of left shoulder and left hip pain so he brought her here for evaluation. Denies any blood thinner use. Patient does not recall the fall, but has a history of dementia and the son states this is normal. Patient complains of mild left hip and left shoulder pain, also pain to the jaw and nose.    Past Medical History  Diagnosis Date  . Unspecified adverse effect of unspecified drug, medicinal and biological substance   . Backache, unspecified   . Carpal tunnel syndrome   . Type II or unspecified type diabetes mellitus without mention of complication, not stated as uncontrolled   . Other malaise and fatigue   . Pain in limb   . Esophageal reflux   . Gout, unspecified   . Pure hypercholesterolemia   . Hyperpotassemia   . Unspecified essential hypertension   . Unspecified hypothyroidism   . Jaundice, unspecified, not of newborn   . Memory loss   . Mononeuritis of unspecified site   . Osteoarthrosis, unspecified whether generalized or localized, unspecified site   . Disorder of bone and cartilage, unspecified   . Other chronic pain   . Edema   . Peptic ulcer, unspecified site, unspecified as acute or chronic, without mention of hemorrhage,  perforation, or obstruction   . Renal failure, unspecified     secondary to Vioxx  . Chronic pain syndrome   . Unspecified vitamin D deficiency   . Other B-complex deficiencies   . Abnormal pancreatic function study     elevated enzymes  . History of transfusion of whole blood     Patient Active Problem List   Diagnosis Date Noted  . CHF (congestive heart failure) 07/04/2014  . Nocturnal hypoxia 07/04/2014  . Encounter for Medicare annual wellness exam 04/27/2014  . Hypokalemia 05/20/2013  . At risk for falling 12/06/2011  . HYPERKALEMIA 04/23/2010  . ADVERSE DRUG REACTION 03/27/2009  . GOUT 09/25/2008  . OTHER CHRONIC PAIN 08/14/2008  . VITAMIN B12 DEFICIENCY 07/02/2008  . Vitamin D deficiency 07/02/2008  . Dementia arising in the senium and presenium 07/02/2008  . FATIGUE 06/25/2008  . BACK PAIN, CHRONIC 08/04/2006  . FOOT PAIN, CHRONIC 08/04/2006  . Osteopenia 08/04/2006  . Hypothyroidism 08/02/2006  . Diabetes type 2, controlled 08/02/2006  . HYPERCHOLESTEROLEMIA 08/02/2006  . SYNDROME, CHRONIC PAIN 08/02/2006  . CARPAL TUNNEL SYNDROME 08/02/2006  . NEUROPATHY 08/02/2006  . Essential hypertension 08/02/2006  . GERD 08/02/2006  . PEPTIC ULCER DISEASE 08/02/2006  . Renal failure 08/02/2006  . Osteoarthritis 08/02/2006  . JAUNDICE 08/02/2006  . PEDAL EDEMA 08/02/2006    Past Surgical History  Procedure Laterality Date  . Appendectomy  1928  . Cataract extraction    . Cholecystectomy  1956  . Thyroidectomy  throat surgery-cold nodule  . Tonsillectomy    . Toe amputation      x2 Hammer toes  . Toe nail procedure    . Esophagogastroduodenoscopy      stricture    Current Outpatient Rx  Name  Route  Sig  Dispense  Refill  . ALPRAZolam (XANAX) 0.5 MG tablet   Oral   Take 1 tablet (0.5 mg total) by mouth 2 (two) times daily as needed for anxiety (for agitation- watching for falls).   60 tablet   3   . amLODipine (NORVASC) 5 MG tablet      TAKE ONE  (1) TABLET BY MOUTH EVERY 12 HOURS   180 tablet   3   . food thickener (THICK IT) POWD   Oral   Take by mouth as needed.         . furosemide (LASIX) 20 MG tablet   Oral   Take 20 mg by mouth.         . insulin detemir (LEVEMIR) 100 UNIT/ML injection   Subcutaneous   Inject 0.12 mLs (12 Units total) into the skin daily.   10 mL   11   . insulin lispro (HUMALOG KWIKPEN) 100 UNIT/ML KiwkPen      Inject under skin up to 8 units 3x a day 15 min before a meal   15 mL   11   . Insulin Pen Needle (FIFTY50 PEN NEEDLES) 31G X 5 MM MISC      Use 4x a day Patient taking differently: Use 3x a day   200 each   11   . Insulin Syringe-Needle U-100 (B-D INS SYR ULTRAFINE .5CC/30G) 30G X 1/2" 0.5 ML MISC      Use as directed   100 each   0   . levothyroxine (SYNTHROID, LEVOTHROID) 75 MCG tablet      TAKE 1 TABLET BY MOUTH DAILY   90 tablet   3   . lidocaine (LIDODERM) 5 %   Transdermal   Place 1 patch onto the skin daily. Remove & Discard patch within 12 hours or as directed by MD          . meclizine (ANTIVERT) 32 MG tablet   Oral   Take 32 mg by mouth as needed.         Marland Kitchen omeprazole (PRILOSEC) 20 MG capsule      TAKE ONE CAPSULE BY MOUTH DAILY   90 capsule   3   . potassium chloride (K-DUR,KLOR-CON) 10 MEQ tablet      TAKE 1 TABLET BY MOUTH DAILY WITH A MEAL   30 tablet   0   . potassium chloride (K-DUR,KLOR-CON) 10 MEQ tablet      TAKE 1 TABLET BY MOUTH DAILY WITH A MEAL   90 tablet   3   . ranitidine (ZANTAC) 150 MG tablet      TAKE 1 TABLET BY MOUTH TWICE A DAY   60 tablet   3   . rivastigmine (EXELON) 4.6 mg/24hr   Transdermal   Place 1 patch (4.6 mg total) onto the skin daily.   30 patch   5   . Vitamin D, Ergocalciferol, (DRISDOL) 50000 UNITS CAPS capsule   Oral   Take 50,000 Units by mouth every 7 (seven) days.           Allergies Ace inhibitors; Calcium; Donepezil hydrochloride; Furosemide; Latex; Memantine; Oxycodone-aspirin;  Penicillins; Pregabalin; Rofecoxib; and Sulfonamide derivatives  Family History  Problem Relation Age  of Onset  . Hyperlipidemia Father   . Transient ischemic attack Son   . Depression Son   . Arthritis Mother   . Arthritis Father   . Arthritis      Grandparents    Social History History  Substance Use Topics  . Smoking status: Never Smoker   . Smokeless tobacco: Not on file  . Alcohol Use: No    Review of Systems Constitutional: Negative for fever. Eyes: Negative for visual changes. ENT: Chin/jaw pain. Nose pain. Cardiovascular: Negative for chest pain. Respiratory: Negative for shortness of breath. Gastrointestinal: Negative for abdominal pain Musculoskeletal: Negative for back pain or neck pain Neurological: Negative for headache 10-point ROS otherwise negative.  ____________________________________________   PHYSICAL EXAM:  VITAL SIGNS: ED Triage Vitals  Enc Vitals Group     BP 07/29/14 0820 112/93 mmHg     Pulse Rate 07/29/14 0820 76     Resp 07/29/14 0820 20     Temp 07/29/14 0820 97.9 F (36.6 C)     Temp Source 07/29/14 0820 Oral     SpO2 07/29/14 0820 97 %     Weight 07/29/14 0820 160 lb (72.576 kg)     Height 07/29/14 0820 4\' 10"  (1.473 m)     Head Cir --      Peak Flow --      Pain Score 07/29/14 0840 3     Pain Loc --      Pain Edu? --      Excl. in GC? --     Constitutional: Alert and oriented. Well appearing and in no distress. Eyes: Normal exam, 1-2 mm PERRL bilateral ENT   Head: Ecchymosis to the nasal bridge, left periorbital ecchymosis, right chin ecchymosis, with moderate tenderness palpation. Mild swelling of the upper lip with small abrasion. No oral injuries noted.   Nose: Ecchymosis, mild swelling, moderate tenderness palpation of the nasal bridge. Cardiovascular: Normal rate, regular rhythm.  Respiratory: Normal respiratory effort without tachypnea nor retractions. Breath sounds are clear Gastrointestinal: Soft and  nontender. No distention.  Musculoskeletal: Mild in his palpation of the left shoulder, mild to moderate pain with range of motion. Mild tenderness of the left hip to palpation, but no pain with range of motion. Both appear neurovascularly intact distally. Neurologic:  Normal speech and language. No gross focal neurologic deficits  Psychiatric: Mood and affect are normal. At baseline per son.  ____________________________________________      RADIOLOGY  Imaging does not show any acute abnormalities.  ____________________________________________   INITIAL IMPRESSION / ASSESSMENT AND PLAN / ED COURSE  Pertinent labs & imaging results that were available during my care of the patient were reviewed by me and considered in my medical decision making (see chart for details).  Patient with what appears to be a mechanical fall last night. We will check a CT head, face, C-spine, left shoulder x-ray, left hip x-ray to further evaluate. Patient with no pain complaints while at rest, but moderate tenderness to palpation.   Imaging within normal limits. Discussed with patient and son scheduled Tylenol for pain, and orthopedics follow-up if pain does not improve. Patient and son are agreeable to this plan.  ____________________________________________   FINAL CLINICAL IMPRESSION(S) / ED DIAGNOSES  Fall Contusions   Minna AntisKevin Brylyn Novakovich, MD 07/29/14 [phone]

## 2014-07-29 NOTE — Telephone Encounter (Signed)
[facility]  Patient had a bad fall. Patient's in the ER now.  She hurt her face,shoulder,and hip.

## 2014-07-29 NOTE — ED Notes (Signed)
Pt brought in by son with reports of falling last night at home. Pt does ambulate with walker per son and need bilteral knee replacement. Son states she fell on a wooden coffee table hitting her chin and nose. Pt c/o left hip, facial and neck pain. No acute distress noted. Bruising noted to chin and  Nose. Pt with dementia, a/ox3. Pt lives with son.

## 2014-07-29 NOTE — Telephone Encounter (Signed)
Thanks - I will follow her ED notes

## 2014-07-29 NOTE — ED Notes (Signed)
Patient transported to CT 

## 2014-08-01 ENCOUNTER — Telehealth: Payer: Self-pay | Admitting: Family Medicine

## 2014-08-01 NOTE — Telephone Encounter (Signed)
I will see them then, thanks for the heads up

## 2014-08-01 NOTE — Telephone Encounter (Signed)
Spoke with Sara Lang and Sara Lang is doing better since leaving the hospital she only has a broken nose so she has a little of a black eye.  Sara Lang thinks his brother's ex-wife put a false complaint in on Sara Lang. Sara Lang a Child psychotherapist came out to home and advise Sara Lang that the anonymous source said that Sara Lang's other son Sara Lang is the primary care giver and he is leaving her home alone, and Sara Lang is laid up at the house with stage 4 cancer so he isn't taking care of Sara Lang either. Sara Lang was very upset about the call, but wanted to give you a heads-up because Sara the Child psychotherapist said she will be contacting Dr. Milinda Antis too. Sara Lang scheduled a f/u appt with you on Mondayyou so you can eval Sara Lang since leaving the hospital

## 2014-08-01 NOTE — Telephone Encounter (Signed)
Patient was visited by a Child psychotherapist for [facility] [employer].  They received a complaint about how patient is being treated from an anonymous source.  They will be calling to speak to Dr.Tower.  They checked the house and checked her medication and spoke to patient and everything was all right.  Patient's brother was listed as the main caregiver and he's just staying at the house.  Patient thinks it was brother's ex-wife that called to get back at his brother.  Patient is asking for [place] to call him back.

## 2014-08-04 ENCOUNTER — Encounter: Payer: Self-pay | Admitting: Family Medicine

## 2014-08-04 ENCOUNTER — Ambulatory Visit (INDEPENDENT_AMBULATORY_CARE_PROVIDER_SITE_OTHER): Payer: Medicare Other | Admitting: Family Medicine

## 2014-08-04 VITALS — BP 122/80 | HR 72 | Temp 98.1°F | Ht <= 58 in | Wt 118.5 lb

## 2014-08-04 DIAGNOSIS — W19XXXA Unspecified fall, initial encounter: Secondary | ICD-10-CM | POA: Diagnosis not present

## 2014-08-04 DIAGNOSIS — E119 Type 2 diabetes mellitus without complications: Secondary | ICD-10-CM | POA: Diagnosis not present

## 2014-08-04 DIAGNOSIS — F068 Other specified mental disorders due to known physiological condition: Secondary | ICD-10-CM

## 2014-08-04 DIAGNOSIS — E039 Hypothyroidism, unspecified: Secondary | ICD-10-CM

## 2014-08-04 DIAGNOSIS — Y92099 Unspecified place in other non-institutional residence as the place of occurrence of the external cause: Secondary | ICD-10-CM

## 2014-08-04 DIAGNOSIS — Y92009 Unspecified place in unspecified non-institutional (private) residence as the place of occurrence of the external cause: Principal | ICD-10-CM

## 2014-08-04 DIAGNOSIS — F0390 Unspecified dementia without behavioral disturbance: Secondary | ICD-10-CM

## 2014-08-04 LAB — HEMOGLOBIN A1C: HEMOGLOBIN A1C: 7.5 % — AB (ref 4.6–6.5)

## 2014-08-04 LAB — TSH: TSH: 5.08 u[IU]/mL — ABNORMAL HIGH (ref 0.35–4.50)

## 2014-08-04 NOTE — Assessment & Plan Note (Signed)
Steadily worsening Loosing wt  Urged family to seek placement for future when they can no longer care for her  D/c xanax due to fall- pt's son thinks that her anxiety is not bad / he can handle her agitation when needed Will watch carefully Continue exelon patch -she tolerates a small dose

## 2014-08-04 NOTE — Progress Notes (Signed)
Pre visit review using our clinic review tool, if applicable. No additional management support is needed unless otherwise documented below in the visit note. 

## 2014-08-04 NOTE — Assessment & Plan Note (Signed)
A1C today. 

## 2014-08-04 NOTE — Progress Notes (Signed)
Subjective:    Patient ID: Sara Lang, female    DOB: 28-Dec-1920, 81 y.o.   MRN: [id number]  HPI Here for f/u recent ED visit for a fall   Was worked up and sent home with contusions  radiol tests neg for fx except nasal bridge   Ct Head Wo Contrast  07/29/2014   CLINICAL DATA:  Fall yesterday hitting head and face on table a initial encounter  EXAM: CT HEAD WITHOUT CONTRAST  CT MAXILLOFACIAL WITHOUT CONTRAST  CT CERVICAL SPINE WITHOUT CONTRAST  TECHNIQUE: Multidetector CT imaging of the head, cervical spine, and maxillofacial structures were performed using the standard protocol without intravenous contrast. Multiplanar CT image reconstructions of the cervical spine and maxillofacial structures were also generated.  COMPARISON:  05/08/2012  FINDINGS: CT HEAD FINDINGS  The bony calvarium is intact. There is incomplete aeration of the mastoid air cells on the right stable in appearance from the prior study. Atrophic changes and chronic white matter ischemic change are seen. No findings to suggest acute hemorrhage, acute infarction or space-occupying mass lesion are noted.  CT MAXILLOFACIAL FINDINGS  No acute bony abnormality is seen. The paranasal sinuses are somewhat hypoplastic without air-fluid level or significant mucosal abnormality. The orbits and their contents are within normal limits. No gross soft tissue abnormality is seen.  CT CERVICAL SPINE FINDINGS  Seven cervical segments are well visualized. Vertebral body height is well maintained. Disc space narrowing is noted at C5-6 and C6-7 with associated osteophytic change. Multilevel facet hypertrophic changes are noted. No acute fracture or acute facet abnormality is seen. The surrounding soft tissue structures show no acute abnormality.  IMPRESSION: CT of the head: Chronic atrophic and ischemic changes without acute abnormality.  CT of the maxillofacial bones: No acute bony abnormality is seen.  CT of the cervical spine: Degenerative change  without acute abnormality.   Electronically Signed   By: Alcide Clever M.D.   On: 07/29/2014 10:11   Ct Cervical Spine Wo Contrast  07/29/2014   CLINICAL DATA:  Fall yesterday hitting head and face on table a initial encounter  EXAM: CT HEAD WITHOUT CONTRAST  CT MAXILLOFACIAL WITHOUT CONTRAST  CT CERVICAL SPINE WITHOUT CONTRAST  TECHNIQUE: Multidetector CT imaging of the head, cervical spine, and maxillofacial structures were performed using the standard protocol without intravenous contrast. Multiplanar CT image reconstructions of the cervical spine and maxillofacial structures were also generated.  COMPARISON:  05/08/2012  FINDINGS: CT HEAD FINDINGS  The bony calvarium is intact. There is incomplete aeration of the mastoid air cells on the right stable in appearance from the prior study. Atrophic changes and chronic white matter ischemic change are seen. No findings to suggest acute hemorrhage, acute infarction or space-occupying mass lesion are noted.  CT MAXILLOFACIAL FINDINGS  No acute bony abnormality is seen. The paranasal sinuses are somewhat hypoplastic without air-fluid level or significant mucosal abnormality. The orbits and their contents are within normal limits. No gross soft tissue abnormality is seen.  CT CERVICAL SPINE FINDINGS  Seven cervical segments are well visualized. Vertebral body height is well maintained. Disc space narrowing is noted at C5-6 and C6-7 with associated osteophytic change. Multilevel facet hypertrophic changes are noted. No acute fracture or acute facet abnormality is seen. The surrounding soft tissue structures show no acute abnormality.  IMPRESSION: CT of the head: Chronic atrophic and ischemic changes without acute abnormality.  CT of the maxillofacial bones: No acute bony abnormality is seen.  CT of the cervical spine: Degenerative  change without acute abnormality.   Electronically Signed   By: Alcide Clever M.D.   On: 07/29/2014 10:11   Dg Shoulder Left  07/29/2014    CLINICAL DATA:  Recent fall with left shoulder pain, initial encounter  EXAM: LEFT SHOULDER - 2+ VIEW  COMPARISON:  None.  FINDINGS: Mild degenerative changes of the acromioclavicular joint are seen. No acute fracture or dislocation is noted. No soft tissue changes are noted.  IMPRESSION: No acute abnormality noted.   Electronically Signed   By: Alcide Clever M.D.   On: 07/29/2014 09:17   Dg Hip Unilat With Pelvis 2-3 Views Left  07/29/2014   CLINICAL DATA:  81 year old female who fell on the left side with pain. Initial encounter.  EXAM: LEFT HIP (WITH PELVIS) 2-3 VIEWS  COMPARISON:  Right hip and pelvis 05/06/2012. CT Abdomen and Pelvis 11/25/2002.  FINDINGS: Femoral heads remain normally located. Bone mineralization is normal for age. Pelvis appears stable and intact. Grossly intact proximal right femur. Small pelvic phleboliths Re identified. SI joints appear stable.  Proximal left femur appears intact. Mild calcified atherosclerosis continuing into the left lower extremity.  IMPRESSION: No acute fracture or dislocation identified about the left hip or pelvis.  If occult hip fracture is suspected or if the patient is unable to weightbear, MRI is the preferred modality for further evaluation.   Electronically Signed   By: Odessa Fleming M.D.   On: 07/29/2014 09:18   Ct Maxillofacial Wo Cm  07/29/2014   CLINICAL DATA:  Fall yesterday hitting head and face on table a initial encounter  EXAM: CT HEAD WITHOUT CONTRAST  CT MAXILLOFACIAL WITHOUT CONTRAST  CT CERVICAL SPINE WITHOUT CONTRAST  TECHNIQUE: Multidetector CT imaging of the head, cervical spine, and maxillofacial structures were performed using the standard protocol without intravenous contrast. Multiplanar CT image reconstructions of the cervical spine and maxillofacial structures were also generated.  COMPARISON:  05/08/2012  FINDINGS: CT HEAD FINDINGS  The bony calvarium is intact. There is incomplete aeration of the mastoid air cells on the right stable in  appearance from the prior study. Atrophic changes and chronic white matter ischemic change are seen. No findings to suggest acute hemorrhage, acute infarction or space-occupying mass lesion are noted.  CT MAXILLOFACIAL FINDINGS  No acute bony abnormality is seen. The paranasal sinuses are somewhat hypoplastic without air-fluid level or significant mucosal abnormality. The orbits and their contents are within normal limits. No gross soft tissue abnormality is seen.  CT CERVICAL SPINE FINDINGS  Seven cervical segments are well visualized. Vertebral body height is well maintained. Disc space narrowing is noted at C5-6 and C6-7 with associated osteophytic change. Multilevel facet hypertrophic changes are noted. No acute fracture or acute facet abnormality is seen. The surrounding soft tissue structures show no acute abnormality.  IMPRESSION: CT of the head: Chronic atrophic and ischemic changes without acute abnormality.  CT of the maxillofacial bones: No acute bony abnormality is seen.  CT of the cervical spine: Degenerative change without acute abnormality.   Electronically Signed   By: Alcide Clever M.D.   On: 07/29/2014 10:11   Face and jaw no longer hurt   The hip is ok  Shoulder is ok now also  Abrasion is on R leg     Has had several falls  One- caught her R leg under dresser  Other- injuries   Falls she has had are when she is with her other son  He is a Engineer, civil (consulting) and he does watch  her close  Dom has POA and does trust him   He strongly thinks that safety is ok at home-refuses to move her to a nursing home  A complaint came from social services - said she was wandering the "streets"  She is never left alone  Has a walker- does not ambulate without it    Her son thinks that xanax has added to falls  Thinks we need to cut the dose or stop it  Son thinks he can handle her agitation    Patient Active Problem List   Diagnosis Date Noted  . CHF (congestive heart failure) 07/04/2014  .  Nocturnal hypoxia 07/04/2014  . Encounter for Medicare annual wellness exam 04/27/2014  . Hypokalemia 05/20/2013  . At risk for falling 12/06/2011  . HYPERKALEMIA 04/23/2010  . ADVERSE DRUG REACTION 03/27/2009  . GOUT 09/25/2008  . OTHER CHRONIC PAIN 08/14/2008  . VITAMIN B12 DEFICIENCY 07/02/2008  . Vitamin D deficiency 07/02/2008  . Dementia arising in the senium and presenium 07/02/2008  . FATIGUE 06/25/2008  . BACK PAIN, CHRONIC 08/04/2006  . FOOT PAIN, CHRONIC 08/04/2006  . Osteopenia 08/04/2006  . Hypothyroidism 08/02/2006  . Diabetes type 2, controlled 08/02/2006  . HYPERCHOLESTEROLEMIA 08/02/2006  . SYNDROME, CHRONIC PAIN 08/02/2006  . CARPAL TUNNEL SYNDROME 08/02/2006  . NEUROPATHY 08/02/2006  . Essential hypertension 08/02/2006  . GERD 08/02/2006  . PEPTIC ULCER DISEASE 08/02/2006  . Renal failure 08/02/2006  . Osteoarthritis 08/02/2006  . JAUNDICE 08/02/2006  . PEDAL EDEMA 08/02/2006   Past Medical History  Diagnosis Date  . Unspecified adverse effect of unspecified drug, medicinal and biological substance   . Backache, unspecified   . Carpal tunnel syndrome   . Type II or unspecified type diabetes mellitus without mention of complication, not stated as uncontrolled   . Other malaise and fatigue   . Pain in limb   . Esophageal reflux   . Gout, unspecified   . Pure hypercholesterolemia   . Hyperpotassemia   . Unspecified essential hypertension   . Unspecified hypothyroidism   . Jaundice, unspecified, not of newborn   . Memory loss   . Mononeuritis of unspecified site   . Osteoarthrosis, unspecified whether generalized or localized, unspecified site   . Disorder of bone and cartilage, unspecified   . Other chronic pain   . Edema   . Peptic ulcer, unspecified site, unspecified as acute or chronic, without mention of hemorrhage, perforation, or obstruction   . Renal failure, unspecified     secondary to Vioxx  . Chronic pain syndrome   . Unspecified  vitamin D deficiency   . Other B-complex deficiencies   . Abnormal pancreatic function study     elevated enzymes  . History of transfusion of whole blood    Past Surgical History  Procedure Laterality Date  . Appendectomy  1928  . Cataract extraction    . Cholecystectomy  1956  . Thyroidectomy      throat surgery-cold nodule  . Tonsillectomy    . Toe amputation      x2 Hammer toes  . Toe nail procedure    . Esophagogastroduodenoscopy      stricture   History  Substance Use Topics  . Smoking status: Never Smoker   . Smokeless tobacco: Not on file  . Alcohol Use: No   Family History  Problem Relation Age of Onset  . Hyperlipidemia Father   . Transient ischemic attack Son   . Depression Son   . Arthritis Mother   .  Arthritis Father   . Arthritis      Grandparents   Allergies  Allergen Reactions  . Ace Inhibitors Other (See Comments)    Reaction: ace inhibitors cause inc K-  . Calcium Nausea Only  . Donepezil Hydrochloride Other (See Comments)    Reaction: MS change  . Furosemide Other (See Comments)    Reaction: Unknown  . Latex Itching and Other (See Comments)    Reaction: Red skin  . Memantine Other (See Comments)    Reaction: MS change  . Oxycodone-Aspirin Other (See Comments)    Reaction: Unknown  . Penicillins Other (See Comments)    Reaction: Unknown  . Pregabalin Nausea And Vomiting  . Rofecoxib Other (See Comments)    Reaction: Renal failure  . Sulfonamide Derivatives Other (See Comments)    Reaction: Unknown   Current Outpatient Prescriptions on File Prior to Visit  Medication Sig Dispense Refill  . ALPRAZolam (XANAX) 0.5 MG tablet Take 1 tablet (0.5 mg total) by mouth 2 (two) times daily as needed for anxiety (for agitation- watching for falls). 60 tablet 3  . amLODipine (NORVASC) 5 MG tablet TAKE ONE (1) TABLET BY MOUTH EVERY 12 HOURS 180 tablet 3  . food thickener (THICK IT) POWD Take 1 Container by mouth as needed.     . furosemide (LASIX) 20  MG tablet Take 20 mg by mouth daily.     . insulin detemir (LEVEMIR) 100 UNIT/ML injection Inject 0.12 mLs (12 Units total) into the skin daily. 10 mL 11  . insulin lispro (HUMALOG KWIKPEN) 100 UNIT/ML KiwkPen Inject under skin up to 8 units 3x a day 15 min before a meal (Patient taking differently: Inject 8 Units into the skin 3 (three) times daily. Inject under skin up to 8 units 3x a day 15 min before a meal) 15 mL 11  . Insulin Pen Needle (FIFTY50 PEN NEEDLES) 31G X 5 MM MISC Use 4x a day (Patient taking differently: Use 3x a day) 200 each 11  . Insulin Syringe-Needle U-100 (B-D INS SYR ULTRAFINE .5CC/30G) 30G X 1/2" 0.5 ML MISC Use as directed 100 each 0  . levothyroxine (SYNTHROID, LEVOTHROID) 75 MCG tablet TAKE 1 TABLET BY MOUTH DAILY 90 tablet 3  . lidocaine (LIDODERM) 5 % Place 1 patch onto the skin daily. Remove & Discard patch within 12 hours or as directed by MD     . omeprazole (PRILOSEC) 20 MG capsule TAKE ONE CAPSULE BY MOUTH DAILY 90 capsule 3  . potassium chloride (K-DUR,KLOR-CON) 10 MEQ tablet TAKE 1 TABLET BY MOUTH DAILY WITH A MEAL 30 tablet 0  . ranitidine (ZANTAC) 150 MG tablet TAKE 1 TABLET BY MOUTH TWICE A DAY (Patient taking differently: TAKE 1 TABLET BY MOUTH ONCE DAILY) 60 tablet 3  . rivastigmine (EXELON) 4.6 mg/24hr Place 1 patch (4.6 mg total) onto the skin daily. 30 patch 5  . Vitamin D, Ergocalciferol, (DRISDOL) 50000 UNITS CAPS capsule Take 50,000 Units by mouth every 7 (seven) days.    . [DISCONTINUED] esomeprazole (NEXIUM) 40 MG capsule Take 1 capsule (40 mg total) by mouth 2 (two) times daily. 60 capsule 11   No current facility-administered medications on file prior to visit.      Review of Systems Review of Systems  Constitutional: Negative for fever, appetite change,  and unexpected weight change. (with dec po intake) Eyes: Negative for pain and visual disturbance.  ENT neg for congestion  Respiratory: Negative for cough and shortness of breath.  Cardiovascular: Negative for cp or palpitations    Gastrointestinal: Negative for nausea, diarrhea and constipation.  Genitourinary: Negative for urgency and frequency.  Skin: Negative for pallor or rash   MSK pos for baseline joint pain Neurological: Negative for weakness, light-headedness, numbness and headaches. pos for abn gait with need for mobility assistance  Hematological: Negative for adenopathy. Does not bruise/bleed easily.  Psychiatric/Behavioral: Negative for dysphoric mood. The patient is occ anxious/agitated , pos for dementia        Objective:   Physical Exam  Constitutional: She appears well-developed and well-nourished. No distress.  Frail appearing elderly female who requires assistance with ambulation  HENT:  Head: Normocephalic.  Nose: Nose normal.  Mouth/Throat: Oropharynx is clear and moist.  Nares are patent Some ecchymosis over nasal bridge   Eyes: Conjunctivae and EOM are normal. Pupils are equal, round, and reactive to light. Right eye exhibits no discharge. Left eye exhibits no discharge. No scleral icterus.  Neck: Normal range of motion. Neck supple. No JVD present. Carotid bruit is not present. No thyromegaly present.  Cardiovascular: Normal rate and regular rhythm.   Pulmonary/Chest: Effort normal and breath sounds normal. No respiratory distress. She has no wheezes. She has no rales.  Musculoskeletal: She exhibits no edema.  Nl rom shoulders and hips Able to bear wt on both legs   Contusion on R shin with some swelling and clean scab  Lymphadenopathy:    She has no cervical adenopathy.  Neurological: She is alert. She has normal reflexes. She displays tremor. She displays no atrophy. No cranial nerve deficit or sensory deficit. She exhibits normal muscle tone. Gait abnormal. Coordination normal.  Very unsteady gait requiring assistance   Skin: Skin is warm and dry. No rash noted.  Contusion with scab R shin   Psychiatric:  Baseline dementia Calm  today and pleasant           Assessment & Plan:   Problem List Items Addressed This Visit    Dementia arising in the senium and presenium    Steadily worsening Loosing wt  Urged family to seek placement for future when they can no longer care for her  D/c xanax due to fall- pt's son thinks that her anxiety is not bad / he can handle her agitation when needed Will watch carefully Continue exelon patch -she tolerates a small dose       Diabetes type 2, controlled    A1C today      Fall at home - Primary    Reviewed ED report and imaging  Nasal fx noted- nl nasal exam today and nares are patent  Nl rom shoulders and hips  Contusion on R shin healing   Long disc re: fall prev and safety in home- agreed that pt needs both assistance and walker at all times and can not be left alone Urged family to be looking for placement in future -when they can no longer care for her   Xanax d/c in light of falls -will watch carefully       Hypothyroidism    tsh today No clinical changes

## 2014-08-04 NOTE — Patient Instructions (Signed)
Stop xanax  If falls continue let me know  Make a long term plan for when you cannot continue to assure her safety  I'm glad she is doing better  Help with feeding in light of problems using utensils   Put bacitracin on the leg wounds - update me if any signs of infection

## 2014-08-04 NOTE — Assessment & Plan Note (Signed)
Reviewed ED report and imaging  Nasal fx noted- nl nasal exam today and nares are patent  Nl rom shoulders and hips  Contusion on R shin healing   Long disc re: fall prev and safety in home- agreed that pt needs both assistance and walker at all times and can not be left alone Urged family to be looking for placement in future -when they can no longer care for her   Xanax d/c in light of falls -will watch carefully

## 2014-08-04 NOTE — Assessment & Plan Note (Signed)
tsh today No clinical changes  

## 2014-08-05 ENCOUNTER — Telehealth: Payer: Self-pay | Admitting: Family Medicine

## 2014-08-05 DIAGNOSIS — M1990 Unspecified osteoarthritis, unspecified site: Secondary | ICD-10-CM | POA: Diagnosis not present

## 2014-08-05 DIAGNOSIS — E119 Type 2 diabetes mellitus without complications: Secondary | ICD-10-CM | POA: Diagnosis not present

## 2014-08-05 DIAGNOSIS — I10 Essential (primary) hypertension: Secondary | ICD-10-CM | POA: Diagnosis not present

## 2014-08-05 DIAGNOSIS — F068 Other specified mental disorders due to known physiological condition: Secondary | ICD-10-CM | POA: Diagnosis not present

## 2014-08-05 DIAGNOSIS — I5020 Unspecified systolic (congestive) heart failure: Secondary | ICD-10-CM | POA: Diagnosis not present

## 2014-08-05 DIAGNOSIS — Z794 Long term (current) use of insulin: Secondary | ICD-10-CM | POA: Diagnosis not present

## 2014-08-05 DIAGNOSIS — E538 Deficiency of other specified B group vitamins: Secondary | ICD-10-CM | POA: Diagnosis not present

## 2014-08-05 DIAGNOSIS — E875 Hyperkalemia: Secondary | ICD-10-CM | POA: Diagnosis not present

## 2014-08-05 NOTE — Telephone Encounter (Signed)
Left voicemail giving Eunice Blase the verbal order

## 2014-08-05 NOTE — Telephone Encounter (Signed)
Please ok that verbal order  

## 2014-08-05 NOTE — Telephone Encounter (Signed)
Debbie called Needs to get verbal order to re-cert patient for nursing and home health aid

## 2014-08-07 ENCOUNTER — Telehealth: Payer: Self-pay | Admitting: Family Medicine

## 2014-08-07 ENCOUNTER — Ambulatory Visit (INDEPENDENT_AMBULATORY_CARE_PROVIDER_SITE_OTHER): Payer: Medicare Other | Admitting: Family Medicine

## 2014-08-07 ENCOUNTER — Encounter: Payer: Self-pay | Admitting: Family Medicine

## 2014-08-07 VITALS — BP 110/62 | HR 68 | Temp 98.5°F | Wt 117.8 lb

## 2014-08-07 DIAGNOSIS — F0390 Unspecified dementia without behavioral disturbance: Secondary | ICD-10-CM

## 2014-08-07 DIAGNOSIS — F068 Other specified mental disorders due to known physiological condition: Secondary | ICD-10-CM

## 2014-08-07 DIAGNOSIS — S81819S Laceration without foreign body, unspecified lower leg, sequela: Secondary | ICD-10-CM

## 2014-08-07 DIAGNOSIS — S81819A Laceration without foreign body, unspecified lower leg, initial encounter: Secondary | ICD-10-CM | POA: Insufficient documentation

## 2014-08-07 MED ORDER — DOXYCYCLINE HYCLATE 100 MG PO TABS: 100.0000 mg | ORAL_TABLET | Freq: Two times a day (BID) | ORAL | Status: DC

## 2014-08-07 NOTE — Assessment & Plan Note (Signed)
Improved off xanax.

## 2014-08-07 NOTE — Progress Notes (Signed)
Pre visit review using our clinic review tool, if applicable. No additional management support is needed unless otherwise documented below in the visit note.  Recently fell.  Now off xanax and much more lucid, can carry on a conversation.  Son was worried about R shin being infected.  No FCANVD.  At baseline except for R shin.    Meds, vitals, and allergies reviewed.   ROS: See HPI.  Otherwise, noncontributory.  nad Speech is clear, able to participate in conversation.  She knows her birthdate.  R skin with shallow skin tear.  No pus but small ~1cm yellow area centrally.  This could be desiccated skin or a small amount of subQ fat, likely the form.  No purulent discharge.  Minimal peripheral erythema with some bruising noted.

## 2014-08-07 NOTE — Patient Instructions (Signed)
Start doxy- take it twice a day. Nonstick bandages with neosporin, then roll gauze (taped to itself) over that.  A sock may help keep it place.   Update [place] as needed.  Take care.

## 2014-08-07 NOTE — Assessment & Plan Note (Signed)
She likely has a higher chance of infection, so we should treat her as if she did have an infected superficial wound.  No need to I&D now, but would use neosporin and start doxy.  D/w son about cleaning and dressing changes.  F/u prn.  He agrees.

## 2014-08-07 NOTE — Telephone Encounter (Signed)
Patient's son,Dom,called and asked for [place] to call him back about patient's medication.

## 2014-08-08 NOTE — Telephone Encounter (Signed)
Pt was seen in office 08/07/14 by Dr. Para March

## 2014-08-13 DIAGNOSIS — I10 Essential (primary) hypertension: Secondary | ICD-10-CM | POA: Diagnosis not present

## 2014-08-13 DIAGNOSIS — E875 Hyperkalemia: Secondary | ICD-10-CM | POA: Diagnosis not present

## 2014-08-13 DIAGNOSIS — E119 Type 2 diabetes mellitus without complications: Secondary | ICD-10-CM | POA: Diagnosis not present

## 2014-08-13 DIAGNOSIS — M1990 Unspecified osteoarthritis, unspecified site: Secondary | ICD-10-CM | POA: Diagnosis not present

## 2014-08-13 DIAGNOSIS — Z794 Long term (current) use of insulin: Secondary | ICD-10-CM | POA: Diagnosis not present

## 2014-08-13 DIAGNOSIS — E538 Deficiency of other specified B group vitamins: Secondary | ICD-10-CM | POA: Diagnosis not present

## 2014-08-13 DIAGNOSIS — F068 Other specified mental disorders due to known physiological condition: Secondary | ICD-10-CM | POA: Diagnosis not present

## 2014-08-14 DIAGNOSIS — Z794 Long term (current) use of insulin: Secondary | ICD-10-CM | POA: Diagnosis not present

## 2014-08-14 DIAGNOSIS — M1990 Unspecified osteoarthritis, unspecified site: Secondary | ICD-10-CM | POA: Diagnosis not present

## 2014-08-14 DIAGNOSIS — E119 Type 2 diabetes mellitus without complications: Secondary | ICD-10-CM | POA: Diagnosis not present

## 2014-08-14 DIAGNOSIS — F068 Other specified mental disorders due to known physiological condition: Secondary | ICD-10-CM | POA: Diagnosis not present

## 2014-08-14 DIAGNOSIS — I10 Essential (primary) hypertension: Secondary | ICD-10-CM | POA: Diagnosis not present

## 2014-08-14 DIAGNOSIS — E538 Deficiency of other specified B group vitamins: Secondary | ICD-10-CM | POA: Diagnosis not present

## 2014-08-14 DIAGNOSIS — E875 Hyperkalemia: Secondary | ICD-10-CM | POA: Diagnosis not present

## 2014-08-19 DIAGNOSIS — I10 Essential (primary) hypertension: Secondary | ICD-10-CM | POA: Diagnosis not present

## 2014-08-19 DIAGNOSIS — F068 Other specified mental disorders due to known physiological condition: Secondary | ICD-10-CM | POA: Diagnosis not present

## 2014-08-19 DIAGNOSIS — Z794 Long term (current) use of insulin: Secondary | ICD-10-CM | POA: Diagnosis not present

## 2014-08-19 DIAGNOSIS — M1990 Unspecified osteoarthritis, unspecified site: Secondary | ICD-10-CM | POA: Diagnosis not present

## 2014-08-19 DIAGNOSIS — E538 Deficiency of other specified B group vitamins: Secondary | ICD-10-CM | POA: Diagnosis not present

## 2014-08-19 DIAGNOSIS — E875 Hyperkalemia: Secondary | ICD-10-CM | POA: Diagnosis not present

## 2014-08-19 DIAGNOSIS — E119 Type 2 diabetes mellitus without complications: Secondary | ICD-10-CM | POA: Diagnosis not present

## 2014-08-20 ENCOUNTER — Other Ambulatory Visit: Payer: Self-pay | Admitting: Family Medicine

## 2014-08-20 NOTE — Telephone Encounter (Signed)
Rx declined  

## 2014-08-20 NOTE — Telephone Encounter (Signed)
At last OV it's noted that pt stopped this Rx, please advise

## 2014-08-20 NOTE — Telephone Encounter (Signed)
This was stopped Please deny it

## 2014-08-21 DIAGNOSIS — I10 Essential (primary) hypertension: Secondary | ICD-10-CM | POA: Diagnosis not present

## 2014-08-21 DIAGNOSIS — E538 Deficiency of other specified B group vitamins: Secondary | ICD-10-CM | POA: Diagnosis not present

## 2014-08-21 DIAGNOSIS — E119 Type 2 diabetes mellitus without complications: Secondary | ICD-10-CM | POA: Diagnosis not present

## 2014-08-21 DIAGNOSIS — Z794 Long term (current) use of insulin: Secondary | ICD-10-CM | POA: Diagnosis not present

## 2014-08-21 DIAGNOSIS — M1990 Unspecified osteoarthritis, unspecified site: Secondary | ICD-10-CM | POA: Diagnosis not present

## 2014-08-21 DIAGNOSIS — E875 Hyperkalemia: Secondary | ICD-10-CM | POA: Diagnosis not present

## 2014-08-21 DIAGNOSIS — F068 Other specified mental disorders due to known physiological condition: Secondary | ICD-10-CM | POA: Diagnosis not present

## 2014-08-26 DIAGNOSIS — F068 Other specified mental disorders due to known physiological condition: Secondary | ICD-10-CM | POA: Diagnosis not present

## 2014-08-26 DIAGNOSIS — E119 Type 2 diabetes mellitus without complications: Secondary | ICD-10-CM | POA: Diagnosis not present

## 2014-08-26 DIAGNOSIS — E875 Hyperkalemia: Secondary | ICD-10-CM | POA: Diagnosis not present

## 2014-08-26 DIAGNOSIS — M1990 Unspecified osteoarthritis, unspecified site: Secondary | ICD-10-CM | POA: Diagnosis not present

## 2014-08-26 DIAGNOSIS — E538 Deficiency of other specified B group vitamins: Secondary | ICD-10-CM | POA: Diagnosis not present

## 2014-08-26 DIAGNOSIS — Z794 Long term (current) use of insulin: Secondary | ICD-10-CM | POA: Diagnosis not present

## 2014-08-26 DIAGNOSIS — I10 Essential (primary) hypertension: Secondary | ICD-10-CM | POA: Diagnosis not present

## 2014-08-28 DIAGNOSIS — E875 Hyperkalemia: Secondary | ICD-10-CM | POA: Diagnosis not present

## 2014-08-28 DIAGNOSIS — E538 Deficiency of other specified B group vitamins: Secondary | ICD-10-CM | POA: Diagnosis not present

## 2014-08-28 DIAGNOSIS — I10 Essential (primary) hypertension: Secondary | ICD-10-CM | POA: Diagnosis not present

## 2014-08-28 DIAGNOSIS — Z794 Long term (current) use of insulin: Secondary | ICD-10-CM | POA: Diagnosis not present

## 2014-08-28 DIAGNOSIS — F068 Other specified mental disorders due to known physiological condition: Secondary | ICD-10-CM | POA: Diagnosis not present

## 2014-08-28 DIAGNOSIS — M1990 Unspecified osteoarthritis, unspecified site: Secondary | ICD-10-CM | POA: Diagnosis not present

## 2014-08-28 DIAGNOSIS — E119 Type 2 diabetes mellitus without complications: Secondary | ICD-10-CM | POA: Diagnosis not present

## 2014-09-02 DIAGNOSIS — I10 Essential (primary) hypertension: Secondary | ICD-10-CM | POA: Diagnosis not present

## 2014-09-02 DIAGNOSIS — M1990 Unspecified osteoarthritis, unspecified site: Secondary | ICD-10-CM | POA: Diagnosis not present

## 2014-09-02 DIAGNOSIS — F068 Other specified mental disorders due to known physiological condition: Secondary | ICD-10-CM | POA: Diagnosis not present

## 2014-09-02 DIAGNOSIS — Z794 Long term (current) use of insulin: Secondary | ICD-10-CM | POA: Diagnosis not present

## 2014-09-02 DIAGNOSIS — E875 Hyperkalemia: Secondary | ICD-10-CM | POA: Diagnosis not present

## 2014-09-02 DIAGNOSIS — E119 Type 2 diabetes mellitus without complications: Secondary | ICD-10-CM | POA: Diagnosis not present

## 2014-09-02 DIAGNOSIS — E538 Deficiency of other specified B group vitamins: Secondary | ICD-10-CM | POA: Diagnosis not present

## 2014-09-03 DIAGNOSIS — E538 Deficiency of other specified B group vitamins: Secondary | ICD-10-CM | POA: Diagnosis not present

## 2014-09-03 DIAGNOSIS — E875 Hyperkalemia: Secondary | ICD-10-CM | POA: Diagnosis not present

## 2014-09-03 DIAGNOSIS — I10 Essential (primary) hypertension: Secondary | ICD-10-CM | POA: Diagnosis not present

## 2014-09-03 DIAGNOSIS — Z794 Long term (current) use of insulin: Secondary | ICD-10-CM | POA: Diagnosis not present

## 2014-09-03 DIAGNOSIS — E119 Type 2 diabetes mellitus without complications: Secondary | ICD-10-CM | POA: Diagnosis not present

## 2014-09-03 DIAGNOSIS — M1990 Unspecified osteoarthritis, unspecified site: Secondary | ICD-10-CM | POA: Diagnosis not present

## 2014-09-03 DIAGNOSIS — F068 Other specified mental disorders due to known physiological condition: Secondary | ICD-10-CM | POA: Diagnosis not present

## 2014-09-04 DIAGNOSIS — Z794 Long term (current) use of insulin: Secondary | ICD-10-CM | POA: Diagnosis not present

## 2014-09-04 DIAGNOSIS — F068 Other specified mental disorders due to known physiological condition: Secondary | ICD-10-CM | POA: Diagnosis not present

## 2014-09-04 DIAGNOSIS — E875 Hyperkalemia: Secondary | ICD-10-CM | POA: Diagnosis not present

## 2014-09-04 DIAGNOSIS — E119 Type 2 diabetes mellitus without complications: Secondary | ICD-10-CM | POA: Diagnosis not present

## 2014-09-04 DIAGNOSIS — E538 Deficiency of other specified B group vitamins: Secondary | ICD-10-CM | POA: Diagnosis not present

## 2014-09-04 DIAGNOSIS — I10 Essential (primary) hypertension: Secondary | ICD-10-CM | POA: Diagnosis not present

## 2014-09-04 DIAGNOSIS — I5020 Unspecified systolic (congestive) heart failure: Secondary | ICD-10-CM | POA: Diagnosis not present

## 2014-09-04 DIAGNOSIS — M1990 Unspecified osteoarthritis, unspecified site: Secondary | ICD-10-CM | POA: Diagnosis not present

## 2014-09-05 ENCOUNTER — Encounter: Payer: Self-pay | Admitting: Family Medicine

## 2014-09-05 ENCOUNTER — Ambulatory Visit (INDEPENDENT_AMBULATORY_CARE_PROVIDER_SITE_OTHER): Payer: Medicare Other | Admitting: Family Medicine

## 2014-09-05 VITALS — BP 128/70 | HR 59 | Temp 97.4°F | Wt 116.8 lb

## 2014-09-05 DIAGNOSIS — I10 Essential (primary) hypertension: Secondary | ICD-10-CM | POA: Diagnosis not present

## 2014-09-05 DIAGNOSIS — E039 Hypothyroidism, unspecified: Secondary | ICD-10-CM | POA: Diagnosis not present

## 2014-09-05 DIAGNOSIS — N19 Unspecified kidney failure: Secondary | ICD-10-CM | POA: Diagnosis not present

## 2014-09-05 DIAGNOSIS — E538 Deficiency of other specified B group vitamins: Secondary | ICD-10-CM

## 2014-09-05 MED ORDER — LEVOTHYROXINE SODIUM 88 MCG PO TABS: 88.0000 ug | ORAL_TABLET | Freq: Every day | ORAL | Status: DC

## 2014-09-05 NOTE — Patient Instructions (Signed)
Increase levothyroxine to 88 mcg daily  Let's see if fatigue improves Schedule non fasting labs in 4-6 weeks

## 2014-09-05 NOTE — Progress Notes (Signed)
Pre visit review using our clinic review tool, if applicable. No additional management support is needed unless otherwise documented below in the visit note. 

## 2014-09-05 NOTE — Progress Notes (Signed)
Subjective:    Patient ID: Sara Lang, female    DOB: [birth year], 81 y.o.   MRN: [id number]  HPI Here for fatigue   1-2 weeks of being "tired all the time" Even when she gets up in the am and sits in her chair   Lab Results  Component Value Date   TSH 5.08* 08/04/2014    Has not raised it to 88 mcg   Is hungry - then is very full after just a few bites  It depends on the day and what she has  Sometimes lets her have sweets - though not often   Not on xanax - more alert and no falls  More lucid Not anxious - keeping the dog on her lap and that really helps her mood   Looked at ensure - decided not enough side effects     Chemistry      Component Value Date/Time   NA 138 04/18/2014 0902   NA 140 02/15/2013 0326   K 3.9 04/18/2014 0902   K 2.8* 02/15/2013 0326   CL 99 04/18/2014 0902   CL 103 02/15/2013 0326   CO2 30 04/18/2014 0902   CO2 28 02/15/2013 0326   BUN 35* 04/18/2014 0902   BUN 25* 02/15/2013 0326   CREATININE 1.72* 04/18/2014 0902   CREATININE 1.82* 02/15/2013 0326      Component Value Date/Time   CALCIUM 9.5 04/18/2014 0902   CALCIUM 8.4* 02/15/2013 0326   ALKPHOS 130* 04/18/2014 0902   ALKPHOS 163* 02/14/2013 2100   AST 12 04/18/2014 0902   AST 26 02/14/2013 2100   ALT 8 04/18/2014 0902   ALT 28 02/14/2013 2100   BILITOT 1.0 04/18/2014 0902      Lab Results  Component Value Date   HGBA1C 7.5* 08/04/2014    Patient Active Problem List   Diagnosis Date Noted  . Skin tear of lower leg without complication 08/07/2014  . Fall at home 08/04/2014  . CHF (congestive heart failure) 07/04/2014  . Nocturnal hypoxia 07/04/2014  . Encounter for Medicare annual wellness exam 04/27/2014  . Hypokalemia 05/20/2013  . At risk for falling 12/06/2011  . HYPERKALEMIA 04/23/2010  . ADVERSE DRUG REACTION 03/27/2009  . GOUT 09/25/2008  . OTHER CHRONIC PAIN 08/14/2008  . VITAMIN B12 DEFICIENCY 07/02/2008  . Vitamin D deficiency 07/02/2008  . Dementia  arising in the senium and presenium 07/02/2008  . FATIGUE 06/25/2008  . BACK PAIN, CHRONIC 08/04/2006  . FOOT PAIN, CHRONIC 08/04/2006  . Osteopenia 08/04/2006  . Hypothyroidism 08/02/2006  . Diabetes type 2, controlled 08/02/2006  . HYPERCHOLESTEROLEMIA 08/02/2006  . SYNDROME, CHRONIC PAIN 08/02/2006  . CARPAL TUNNEL SYNDROME 08/02/2006  . NEUROPATHY 08/02/2006  . Essential hypertension 08/02/2006  . GERD 08/02/2006  . PEPTIC ULCER DISEASE 08/02/2006  . Renal failure 08/02/2006  . Osteoarthritis 08/02/2006  . JAUNDICE 08/02/2006  . PEDAL EDEMA 08/02/2006   Past Medical History  Diagnosis Date  . Unspecified adverse effect of unspecified drug, medicinal and biological substance   . Backache, unspecified   . Carpal tunnel syndrome   . Type II or unspecified type diabetes mellitus without mention of complication, not stated as uncontrolled   . Other malaise and fatigue   . Pain in limb   . Esophageal reflux   . Gout, unspecified   . Pure hypercholesterolemia   . Hyperpotassemia   . Unspecified essential hypertension   . Unspecified hypothyroidism   . Jaundice, unspecified, not of newborn   .  Memory loss   . Mononeuritis of unspecified site   . Osteoarthrosis, unspecified whether generalized or localized, unspecified site   . Disorder of bone and cartilage, unspecified   . Other chronic pain   . Edema   . Peptic ulcer, unspecified site, unspecified as acute or chronic, without mention of hemorrhage, perforation, or obstruction   . Renal failure, unspecified     secondary to Vioxx  . Chronic pain syndrome   . Unspecified vitamin D deficiency   . Other B-complex deficiencies   . Abnormal pancreatic function study     elevated enzymes  . History of transfusion of whole blood    Past Surgical History  Procedure Laterality Date  . Appendectomy  1928  . Cataract extraction    . Cholecystectomy  1956  . Thyroidectomy      throat surgery-cold nodule  . Tonsillectomy      . Toe amputation      x2 Hammer toes  . Toe nail procedure    . Esophagogastroduodenoscopy      stricture   History  Substance Use Topics  . Smoking status: Never Smoker   . Smokeless tobacco: Not on file  . Alcohol Use: No   Family History  Problem Relation Age of Onset  . Hyperlipidemia Father   . Transient ischemic attack Son   . Depression Son   . Arthritis Mother   . Arthritis Father   . Arthritis      Grandparents   Allergies  Allergen Reactions  . Ace Inhibitors Other (See Comments)    Reaction: ace inhibitors cause inc K-  . Calcium Nausea Only  . Donepezil Hydrochloride Other (See Comments)    Reaction: MS change  . Latex Itching and Other (See Comments)    Reaction: Red skin  . Memantine Other (See Comments)    Reaction: MS change  . Oxycodone-Aspirin Other (See Comments)    Reaction: Unknown  . Penicillins Other (See Comments)    Reaction: Unknown  . Pregabalin Nausea And Vomiting  . Rofecoxib Other (See Comments)    Reaction: Renal failure  . Sulfonamide Derivatives Other (See Comments)    Reaction: Unknown   Current Outpatient Prescriptions on File Prior to Visit  Medication Sig Dispense Refill  . amLODipine (NORVASC) 5 MG tablet TAKE ONE (1) TABLET BY MOUTH EVERY 12 HOURS 180 tablet 3  . food thickener (THICK IT) POWD Take 1 Container by mouth as needed.     . furosemide (LASIX) 20 MG tablet Take 20 mg by mouth daily.     . insulin detemir (LEVEMIR) 100 UNIT/ML injection Inject 0.12 mLs (12 Units total) into the skin daily. 10 mL 11  . insulin lispro (HUMALOG KWIKPEN) 100 UNIT/ML KiwkPen Inject under skin up to 8 units 3x a day 15 min before a meal (Patient taking differently: Inject 8 Units into the skin 3 (three) times daily. Inject under skin up to 8 units 3x a day 15 min before a meal) 15 mL 11  . Insulin Pen Needle (FIFTY50 PEN NEEDLES) 31G X 5 MM MISC Use 4x a day (Patient taking differently: Use 3x a day) 200 each 11  . Insulin Syringe-Needle  U-100 (B-D INS SYR ULTRAFINE .5CC/30G) 30G X 1/2" 0.5 ML MISC Use as directed 100 each 0  . levothyroxine (SYNTHROID, LEVOTHROID) 75 MCG tablet TAKE 1 TABLET BY MOUTH DAILY 90 tablet 3  . lidocaine (LIDODERM) 5 % Place 1 patch onto the skin daily. Remove & Discard patch within  12 hours or as directed by MD     . omeprazole (PRILOSEC) 20 MG capsule TAKE ONE CAPSULE BY MOUTH DAILY 90 capsule 3  . potassium chloride (K-DUR,KLOR-CON) 10 MEQ tablet TAKE 1 TABLET BY MOUTH DAILY WITH A MEAL 30 tablet 0  . ranitidine (ZANTAC) 150 MG tablet TAKE 1 TABLET BY MOUTH TWICE A DAY (Patient taking differently: TAKE 1 TABLET BY MOUTH ONCE DAILY) 60 tablet 3  . rivastigmine (EXELON) 4.6 mg/24hr Place 1 patch (4.6 mg total) onto the skin daily. 30 patch 5  . Vitamin D, Ergocalciferol, (DRISDOL) 50000 UNITS CAPS capsule Take 50,000 Units by mouth every 7 (seven) days.    . [DISCONTINUED] esomeprazole (NEXIUM) 40 MG capsule Take 1 capsule (40 mg total) by mouth 2 (two) times daily. 60 capsule 11   No current facility-administered medications on file prior to visit.     Review of Systems Review of Systems  Constitutional: Negative for fever, appetite change, and unexpected weight change.  Eyes: Negative for pain and visual disturbance.  Respiratory: Negative for cough and shortness of breath.   Cardiovascular: Negative for cp or palpitations    Gastrointestinal: Negative for nausea, diarrhea and constipation.  Genitourinary: Negative for urgency and frequency.  Skin: Negative for pallor or rash   Neurological: Negative for weakness, light-headedness, numbness and headaches.  Hematological: Negative for adenopathy. Does not bruise/bleed easily.  Psychiatric/Behavioral: Negative for dysphoric mood. The patient is not nervous/anxious.  pos for dementia  MSK pos for chronic joint pain        Objective:   Physical Exam  Constitutional: She appears well-developed and well-nourished. No distress.  Frail app  elderly female with dementia   HENT:  Head: Normocephalic and atraumatic.  Mouth/Throat: Oropharynx is clear and moist.  Eyes: Conjunctivae and EOM are normal. Pupils are equal, round, and reactive to light.  Neck: Normal range of motion. Neck supple. No JVD present. Carotid bruit is not present. No thyromegaly present.  Cardiovascular: Normal rate, regular rhythm, normal heart sounds and intact distal pulses.  Exam reveals no gallop.   Pulmonary/Chest: Effort normal and breath sounds normal. No respiratory distress. She has no wheezes. She has no rales.  No crackles  Abdominal: Soft. Bowel sounds are normal. She exhibits no distension, no abdominal bruit and no mass. There is no tenderness.  Musculoskeletal: She exhibits no edema.  Baseline OA changes in joints   Lymphadenopathy:    She has no cervical adenopathy.  Neurological: She is alert. She has normal reflexes.  Skin: Skin is warm and dry. No rash noted.  Psychiatric: She has a normal mood and affect.  More alert today Cheerful Baseline dementia- repeats herself often and mumbles at times           Assessment & Plan:   Problem List Items Addressed This Visit    B12 deficiency    Lab Results  Component Value Date   VITAMINB12 528 04/18/2014   This is in fair control        Relevant Orders   Vitamin B12   Essential hypertension - Primary    bp in fair control at this time  BP Readings from Last 1 Encounters:  09/05/14 128/70   No changes needed Disc lifstyle change with low sodium diet and exercise as tolerated       Relevant Orders   Basic metabolic panel   Hypothyroidism    Lab Results  Component Value Date   TSH 5.08* 08/04/2014   This was  high - and now pt seems symptomatic  Will raise dose of levothyroxine to daily  Re check 4-6 wk Hope this will help fatigue      Relevant Medications   levothyroxine (SYNTHROID, LEVOTHROID) 88 MCG tablet   Other Relevant Orders   TSH   Renal failure     Will re check lab at f/u  No nausea or other symptoms besides fatigued  Continue to watch meds and glucose control      Relevant Orders   Basic metabolic panel

## 2014-09-07 NOTE — Assessment & Plan Note (Signed)
Lab Results  Component Value Date   TSH 5.08* 08/04/2014   This was high - and now pt seems symptomatic  Will raise dose of levothyroxine to 88mcg daily  Re check 4-6 wk Hope this will help fatigue

## 2014-09-07 NOTE — Assessment & Plan Note (Signed)
bp in fair control at this time  BP Readings from Last 1 Encounters:  09/05/14 128/70   No changes needed Disc lifstyle change with low sodium diet and exercise as tolerated

## 2014-09-07 NOTE — Assessment & Plan Note (Signed)
Lab Results  Component Value Date   VITAMINB12 528 04/18/2014   This is in fair control

## 2014-09-07 NOTE — Assessment & Plan Note (Signed)
Will re check lab at f/u  No nausea or other symptoms besides fatigued  Continue to watch meds and glucose control

## 2014-09-11 DIAGNOSIS — E119 Type 2 diabetes mellitus without complications: Secondary | ICD-10-CM | POA: Diagnosis not present

## 2014-09-11 DIAGNOSIS — Z794 Long term (current) use of insulin: Secondary | ICD-10-CM | POA: Diagnosis not present

## 2014-09-11 DIAGNOSIS — I10 Essential (primary) hypertension: Secondary | ICD-10-CM | POA: Diagnosis not present

## 2014-09-11 DIAGNOSIS — F068 Other specified mental disorders due to known physiological condition: Secondary | ICD-10-CM | POA: Diagnosis not present

## 2014-09-11 DIAGNOSIS — M1990 Unspecified osteoarthritis, unspecified site: Secondary | ICD-10-CM | POA: Diagnosis not present

## 2014-09-11 DIAGNOSIS — E875 Hyperkalemia: Secondary | ICD-10-CM | POA: Diagnosis not present

## 2014-09-11 DIAGNOSIS — E538 Deficiency of other specified B group vitamins: Secondary | ICD-10-CM | POA: Diagnosis not present

## 2014-09-18 DIAGNOSIS — Z794 Long term (current) use of insulin: Secondary | ICD-10-CM | POA: Diagnosis not present

## 2014-09-18 DIAGNOSIS — M1990 Unspecified osteoarthritis, unspecified site: Secondary | ICD-10-CM | POA: Diagnosis not present

## 2014-09-18 DIAGNOSIS — E119 Type 2 diabetes mellitus without complications: Secondary | ICD-10-CM | POA: Diagnosis not present

## 2014-09-18 DIAGNOSIS — I10 Essential (primary) hypertension: Secondary | ICD-10-CM | POA: Diagnosis not present

## 2014-09-18 DIAGNOSIS — E538 Deficiency of other specified B group vitamins: Secondary | ICD-10-CM | POA: Diagnosis not present

## 2014-09-18 DIAGNOSIS — E875 Hyperkalemia: Secondary | ICD-10-CM | POA: Diagnosis not present

## 2014-09-18 DIAGNOSIS — F068 Other specified mental disorders due to known physiological condition: Secondary | ICD-10-CM | POA: Diagnosis not present

## 2014-09-25 DIAGNOSIS — Z794 Long term (current) use of insulin: Secondary | ICD-10-CM | POA: Diagnosis not present

## 2014-09-25 DIAGNOSIS — E119 Type 2 diabetes mellitus without complications: Secondary | ICD-10-CM | POA: Diagnosis not present

## 2014-09-25 DIAGNOSIS — F068 Other specified mental disorders due to known physiological condition: Secondary | ICD-10-CM | POA: Diagnosis not present

## 2014-09-25 DIAGNOSIS — E538 Deficiency of other specified B group vitamins: Secondary | ICD-10-CM | POA: Diagnosis not present

## 2014-09-25 DIAGNOSIS — M1990 Unspecified osteoarthritis, unspecified site: Secondary | ICD-10-CM | POA: Diagnosis not present

## 2014-09-25 DIAGNOSIS — E875 Hyperkalemia: Secondary | ICD-10-CM | POA: Diagnosis not present

## 2014-09-25 DIAGNOSIS — I10 Essential (primary) hypertension: Secondary | ICD-10-CM | POA: Diagnosis not present

## 2014-09-30 DIAGNOSIS — F068 Other specified mental disorders due to known physiological condition: Secondary | ICD-10-CM | POA: Diagnosis not present

## 2014-09-30 DIAGNOSIS — I10 Essential (primary) hypertension: Secondary | ICD-10-CM | POA: Diagnosis not present

## 2014-09-30 DIAGNOSIS — E875 Hyperkalemia: Secondary | ICD-10-CM | POA: Diagnosis not present

## 2014-09-30 DIAGNOSIS — E119 Type 2 diabetes mellitus without complications: Secondary | ICD-10-CM | POA: Diagnosis not present

## 2014-09-30 DIAGNOSIS — E538 Deficiency of other specified B group vitamins: Secondary | ICD-10-CM | POA: Diagnosis not present

## 2014-09-30 DIAGNOSIS — M1990 Unspecified osteoarthritis, unspecified site: Secondary | ICD-10-CM | POA: Diagnosis not present

## 2014-09-30 DIAGNOSIS — Z794 Long term (current) use of insulin: Secondary | ICD-10-CM | POA: Diagnosis not present

## 2014-10-02 DIAGNOSIS — M1990 Unspecified osteoarthritis, unspecified site: Secondary | ICD-10-CM | POA: Diagnosis not present

## 2014-10-02 DIAGNOSIS — I10 Essential (primary) hypertension: Secondary | ICD-10-CM | POA: Diagnosis not present

## 2014-10-02 DIAGNOSIS — Z794 Long term (current) use of insulin: Secondary | ICD-10-CM | POA: Diagnosis not present

## 2014-10-02 DIAGNOSIS — F068 Other specified mental disorders due to known physiological condition: Secondary | ICD-10-CM | POA: Diagnosis not present

## 2014-10-02 DIAGNOSIS — E119 Type 2 diabetes mellitus without complications: Secondary | ICD-10-CM | POA: Diagnosis not present

## 2014-10-02 DIAGNOSIS — E538 Deficiency of other specified B group vitamins: Secondary | ICD-10-CM | POA: Diagnosis not present

## 2014-10-02 DIAGNOSIS — E875 Hyperkalemia: Secondary | ICD-10-CM | POA: Diagnosis not present

## 2014-10-03 DIAGNOSIS — E119 Type 2 diabetes mellitus without complications: Secondary | ICD-10-CM | POA: Diagnosis not present

## 2014-10-03 DIAGNOSIS — E538 Deficiency of other specified B group vitamins: Secondary | ICD-10-CM | POA: Diagnosis not present

## 2014-10-03 DIAGNOSIS — I10 Essential (primary) hypertension: Secondary | ICD-10-CM | POA: Diagnosis not present

## 2014-10-03 DIAGNOSIS — M1990 Unspecified osteoarthritis, unspecified site: Secondary | ICD-10-CM | POA: Diagnosis not present

## 2014-10-03 DIAGNOSIS — F068 Other specified mental disorders due to known physiological condition: Secondary | ICD-10-CM | POA: Diagnosis not present

## 2014-10-03 DIAGNOSIS — E875 Hyperkalemia: Secondary | ICD-10-CM | POA: Diagnosis not present

## 2014-10-03 DIAGNOSIS — Z794 Long term (current) use of insulin: Secondary | ICD-10-CM | POA: Diagnosis not present

## 2014-10-05 DIAGNOSIS — I5020 Unspecified systolic (congestive) heart failure: Secondary | ICD-10-CM | POA: Diagnosis not present

## 2014-10-06 DIAGNOSIS — E538 Deficiency of other specified B group vitamins: Secondary | ICD-10-CM | POA: Diagnosis not present

## 2014-10-06 DIAGNOSIS — I10 Essential (primary) hypertension: Secondary | ICD-10-CM | POA: Diagnosis not present

## 2014-10-06 DIAGNOSIS — E119 Type 2 diabetes mellitus without complications: Secondary | ICD-10-CM | POA: Diagnosis not present

## 2014-10-06 DIAGNOSIS — Z794 Long term (current) use of insulin: Secondary | ICD-10-CM | POA: Diagnosis not present

## 2014-10-06 DIAGNOSIS — F068 Other specified mental disorders due to known physiological condition: Secondary | ICD-10-CM | POA: Diagnosis not present

## 2014-10-06 DIAGNOSIS — E875 Hyperkalemia: Secondary | ICD-10-CM | POA: Diagnosis not present

## 2014-10-06 DIAGNOSIS — M1990 Unspecified osteoarthritis, unspecified site: Secondary | ICD-10-CM | POA: Diagnosis not present

## 2014-10-07 DIAGNOSIS — M1990 Unspecified osteoarthritis, unspecified site: Secondary | ICD-10-CM | POA: Diagnosis not present

## 2014-10-07 DIAGNOSIS — E538 Deficiency of other specified B group vitamins: Secondary | ICD-10-CM | POA: Diagnosis not present

## 2014-10-07 DIAGNOSIS — E119 Type 2 diabetes mellitus without complications: Secondary | ICD-10-CM | POA: Diagnosis not present

## 2014-10-07 DIAGNOSIS — E875 Hyperkalemia: Secondary | ICD-10-CM | POA: Diagnosis not present

## 2014-10-07 DIAGNOSIS — F068 Other specified mental disorders due to known physiological condition: Secondary | ICD-10-CM | POA: Diagnosis not present

## 2014-10-07 DIAGNOSIS — I10 Essential (primary) hypertension: Secondary | ICD-10-CM | POA: Diagnosis not present

## 2014-10-07 DIAGNOSIS — Z794 Long term (current) use of insulin: Secondary | ICD-10-CM | POA: Diagnosis not present

## 2014-10-10 ENCOUNTER — Other Ambulatory Visit (INDEPENDENT_AMBULATORY_CARE_PROVIDER_SITE_OTHER): Payer: Medicare Other

## 2014-10-10 DIAGNOSIS — N19 Unspecified kidney failure: Secondary | ICD-10-CM | POA: Diagnosis not present

## 2014-10-10 DIAGNOSIS — E538 Deficiency of other specified B group vitamins: Secondary | ICD-10-CM | POA: Diagnosis not present

## 2014-10-10 DIAGNOSIS — I10 Essential (primary) hypertension: Secondary | ICD-10-CM

## 2014-10-10 DIAGNOSIS — E039 Hypothyroidism, unspecified: Secondary | ICD-10-CM

## 2014-10-10 LAB — BASIC METABOLIC PANEL
BUN: 31 mg/dL — AB (ref 6–23)
CHLORIDE: 102 meq/L (ref 96–112)
CO2: 28 mEq/L (ref 19–32)
Calcium: 9.1 mg/dL (ref 8.4–10.5)
Creatinine, Ser: 1.56 mg/dL — ABNORMAL HIGH (ref 0.40–1.20)
GFR: 32.99 mL/min — AB (ref >60.00)
Glucose, Bld: 243 mg/dL — ABNORMAL HIGH (ref 70–99)
POTASSIUM: 3.5 meq/L (ref 3.5–5.1)
Sodium: 140 mEq/L (ref 135–145)

## 2014-10-10 LAB — VITAMIN B12: Vitamin B-12: 346 pg/mL (ref 211–911)

## 2014-10-10 LAB — TSH: TSH: 1.06 u[IU]/mL (ref 0.35–4.50)

## 2014-10-13 ENCOUNTER — Encounter: Payer: Self-pay | Admitting: *Deleted

## 2014-10-22 ENCOUNTER — Other Ambulatory Visit: Payer: Self-pay | Admitting: Family Medicine

## 2014-10-22 NOTE — Telephone Encounter (Signed)
done

## 2014-10-22 NOTE — Telephone Encounter (Signed)
Electronic refill request, last refilled on 04/14/14 #30 with 5 additional refills, please advise

## 2014-10-22 NOTE — Telephone Encounter (Signed)
Please refill for a year  

## 2014-11-05 DIAGNOSIS — I5020 Unspecified systolic (congestive) heart failure: Secondary | ICD-10-CM | POA: Diagnosis not present

## 2014-12-05 DIAGNOSIS — I5020 Unspecified systolic (congestive) heart failure: Secondary | ICD-10-CM | POA: Diagnosis not present

## 2015-01-05 DIAGNOSIS — I5020 Unspecified systolic (congestive) heart failure: Secondary | ICD-10-CM | POA: Diagnosis not present

## 2015-01-14 ENCOUNTER — Other Ambulatory Visit: Payer: Self-pay

## 2015-01-14 DIAGNOSIS — R739 Hyperglycemia, unspecified: Secondary | ICD-10-CM

## 2015-01-20 ENCOUNTER — Other Ambulatory Visit: Payer: Self-pay

## 2015-01-23 ENCOUNTER — Emergency Department
Admission: EM | Admit: 2015-01-23 | Discharge: 2015-01-23 | Disposition: A | Discharge status: Home / Self Care | Payer: Medicare Other | Attending: Emergency Medicine | Admitting: Emergency Medicine

## 2015-01-23 ENCOUNTER — Encounter: Payer: Self-pay | Admitting: Medical Oncology

## 2015-01-23 ENCOUNTER — Telehealth: Payer: Self-pay | Admitting: Family Medicine

## 2015-01-23 ENCOUNTER — Emergency Department: Payer: Medicare Other

## 2015-01-23 DIAGNOSIS — R059 Cough, unspecified: Secondary | ICD-10-CM

## 2015-01-23 DIAGNOSIS — Z880 Allergy status to penicillin: Secondary | ICD-10-CM | POA: Diagnosis not present

## 2015-01-23 DIAGNOSIS — R079 Chest pain, unspecified: Secondary | ICD-10-CM | POA: Diagnosis not present

## 2015-01-23 DIAGNOSIS — I10 Essential (primary) hypertension: Secondary | ICD-10-CM | POA: Diagnosis not present

## 2015-01-23 DIAGNOSIS — R05 Cough: Secondary | ICD-10-CM | POA: Diagnosis not present

## 2015-01-23 DIAGNOSIS — Z79899 Other long term (current) drug therapy: Secondary | ICD-10-CM | POA: Diagnosis not present

## 2015-01-23 DIAGNOSIS — E119 Type 2 diabetes mellitus without complications: Secondary | ICD-10-CM | POA: Diagnosis not present

## 2015-01-23 DIAGNOSIS — Z91040 Latex allergy status: Secondary | ICD-10-CM | POA: Diagnosis not present

## 2015-01-23 DIAGNOSIS — Z794 Long term (current) use of insulin: Secondary | ICD-10-CM | POA: Diagnosis not present

## 2015-01-23 DIAGNOSIS — R410 Disorientation, unspecified: Secondary | ICD-10-CM | POA: Diagnosis not present

## 2015-01-23 DIAGNOSIS — R531 Weakness: Secondary | ICD-10-CM | POA: Diagnosis not present

## 2015-01-23 LAB — BASIC METABOLIC PANEL
Anion gap: 12 (ref 5–15)
BUN: 40 mg/dL — AB (ref 6–20)
CHLORIDE: 97 mmol/L — AB (ref 101–111)
CO2: 26 mmol/L (ref 22–32)
CREATININE: 1.89 mg/dL — AB (ref 0.44–1.00)
Calcium: 9 mg/dL (ref 8.9–10.3)
GFR calc Af Amer: 25 mL/min — ABNORMAL LOW (ref >60)
GFR calc non Af Amer: 22 mL/min — ABNORMAL LOW (ref >60)
Glucose, Bld: 338 mg/dL — ABNORMAL HIGH (ref 65–99)
Potassium: 3.5 mmol/L (ref 3.5–5.1)
Sodium: 135 mmol/L (ref 135–145)

## 2015-01-23 LAB — URINALYSIS COMPLETE WITH MICROSCOPIC (ARMC ONLY)
BILIRUBIN URINE: NEGATIVE
Bacteria, UA: NONE SEEN
Ketones, ur: NEGATIVE mg/dL
Leukocytes, UA: NEGATIVE
NITRITE: NEGATIVE
PH: 5 (ref 5.0–8.0)
Protein, ur: 100 mg/dL — AB
Specific Gravity, Urine: 1.011 (ref 1.005–1.030)

## 2015-01-23 LAB — TROPONIN I
Troponin I: 0.04 ng/mL — ABNORMAL HIGH (ref <0.031)
Troponin I: 0.04 ng/mL — ABNORMAL HIGH

## 2015-01-23 LAB — CBC
HCT: 43.6 % (ref 35.0–47.0)
Hemoglobin: 14.9 g/dL (ref 12.0–16.0)
MCH: 31.7 pg (ref 26.0–34.0)
MCHC: 34.1 g/dL (ref 32.0–36.0)
MCV: 93.1 fL (ref 80.0–100.0)
Platelets: 172 10*3/uL (ref 150–440)
RBC: 4.69 MIL/uL (ref 3.80–5.20)
RDW: 13.6 % (ref 11.5–14.5)
WBC: 7.5 10*3/uL (ref 3.6–11.0)

## 2015-01-23 LAB — GLUCOSE, CAPILLARY: Glucose-Capillary: 269 mg/dL — ABNORMAL HIGH (ref 65–99)

## 2015-01-23 MED ORDER — BENZONATATE 100 MG PO CAPS: 100.0000 mg | ORAL_CAPSULE | Freq: Three times a day (TID) | ORAL | Status: DC | PRN

## 2015-01-23 MED ORDER — SODIUM CHLORIDE 0.9 % IV BOLUS (SEPSIS)
500.0000 mL | Freq: Once | INTRAVENOUS | Status: AC
  Administered 2015-01-23: 500 mL via INTRAVENOUS

## 2015-01-23 NOTE — ED Notes (Signed)
Pt given apple juice, resting in bed with family at bedside, in no acute distress

## 2015-01-23 NOTE — ED Notes (Signed)
MD at bedside. 

## 2015-01-23 NOTE — Telephone Encounter (Signed)
Patient Name: Sara MartenMARY Lang DOB: 04/22/1920 Initial Comment Caller states his mother has a cough, fatigue, she can't even get off the toilet. She feels like someone is sitting on her chest, tightness. BS is 280. Went to bed @ 5:45 PM last night and just got up, he thinks she may have pneumonia. Nurse Assessment Nurse: Charna Elizabethrumbull, RN, Cathy Date/Time (Eastern Time): 01/23/2015 9:36:50 AM Confirm and document reason for call. If symptomatic, describe symptoms. ---Caller states his mother developed a cough yesterday and chest pain this morning. No severe breathing difficulty. She has felt warm this morning and her current temperature is 99.6 oral. Her blood sugar was 280 this morning. Alert and responsive. Has the patient traveled out of the country within the last 30 days? ---No Does the patient have any new or worsening symptoms? ---Yes Will a triage be completed? ---Yes Related visit to physician within the last 2 weeks? ---No Does the PT have any chronic conditions? (i.e. diabetes, asthma, etc.) ---Yes List chronic conditions. ---Dementia, High Blood Pressure, GERD, Low Calcium, Diabetes Is this a behavioral health or substance abuse call? ---No Guidelines Guideline Title Affirmed Question Affirmed Notes Chest Pain [1] Chest pain lasts > 5 minutes AND [2] described as crushing, pressure-like, or heavy Final Disposition User Call EMS 911 Now Trumbull, RN, [employer] LaboratoriesCathy Comments Caller declined the Call 911 disposition. Reinforced the Call 911 disposition. He plans to take her to [facility] [place] [facility] now. Referrals GO TO FACILITY REFUSED Disagree/Comply: Disagree Disagree/Comply Reason: Disagree with instructions

## 2015-01-23 NOTE — Discharge Instructions (Signed)
Please seek medical attention for any high fevers, chest pain, shortness of breath, change in behavior, persistent vomiting, bloody stool or any other new or concerning symptoms. ° ° °Cough, Adult °Coughing is a reflex that clears your throat and your airways. Coughing helps to heal and protect your lungs. It is normal to cough occasionally, but a cough that happens with other symptoms or lasts a long time may be a sign of a condition that needs treatment. A cough may last only 2-3 weeks (acute), or it may last longer than 8 weeks (chronic). °CAUSES °Coughing is commonly caused by: °· Breathing in substances that irritate your lungs. °· A viral or bacterial respiratory infection. °· Allergies. °· Asthma. °· Postnasal drip. °· Smoking. °· Acid backing up from the stomach into the esophagus (gastroesophageal reflux). °· Certain medicines. °· Chronic lung problems, including COPD (or rarely, lung cancer). °· Other medical conditions such as heart failure. °HOME CARE INSTRUCTIONS  °Pay attention to any changes in your symptoms. Take these actions to help with your discomfort: °· Take medicines only as told by your health care provider. °¨ If you were prescribed an antibiotic medicine, take it as told by your health care provider. Do not stop taking the antibiotic even if you start to feel better. °¨ Talk with your health care provider before you take a cough suppressant medicine. °· Drink enough fluid to keep your urine clear or pale yellow. °· If the air is dry, use a cold steam vaporizer or humidifier in your bedroom or your home to help loosen secretions. °· Avoid anything that causes you to cough at work or at home. °· If your cough is worse at night, try sleeping in a semi-upright position. °· Avoid cigarette smoke. If you smoke, quit smoking. If you need help quitting, ask your health care provider. °· Avoid caffeine. °· Avoid alcohol. °· Rest as needed. °SEEK MEDICAL CARE IF:  °· You have new symptoms. °· You  cough up pus. °· Your cough does not get better after 2-3 weeks, or your cough gets worse. °· You cannot control your cough with suppressant medicines and you are losing sleep. °· You develop pain that is getting worse or pain that is not controlled with pain medicines. °· You have a fever. °· You have unexplained weight loss. °· You have night sweats. °SEEK IMMEDIATE MEDICAL CARE IF: °· You cough up blood. °· You have difficulty breathing. °· Your heartbeat is very fast. °  °This information is not intended to replace advice given to you by your health care provider. Make sure you discuss any questions you have with your health care provider. °  °Document Released: 08/06/2010 Document Revised: 10/29/2014 Document Reviewed: 04/16/2014 °Elsevier Interactive Patient Education ©2016 [employer] ° °

## 2015-01-23 NOTE — Telephone Encounter (Signed)
Looks like she is in the ED now -will continue to follow her records

## 2015-01-23 NOTE — ED Notes (Signed)
Pt with son who reports pt began having productive cough yesterday, this am pt began to c/o chest pressure. Pt has also been having more weakness than normal.

## 2015-01-23 NOTE — ED Notes (Signed)
Per pt's son. Pt has been getting weaker, was mobile up until 2 days ago. Cough present on assessment. Confusion and extreme lethargy present. No cardiac hx.

## 2015-01-23 NOTE — ED Provider Notes (Signed)
[facility] Emergency Department Provider Note   ____________________________________________  Time seen: 1245  I have reviewed the triage vital signs and the nursing notes.   HISTORY  Chief Complaint Cough; Weakness; and Chest Pain   History limited by: Limited secondary to dementia, some history obtained from sons   HPI Sara Lang is a 81 y.o. female who was brought to the emergency department today because of son's concerns for increased weakness, confusion and change for the past 2 days. They state that the patient has become much weaker. Is having a harder time performing her ADLs. She is also developed a cough. They feel that it is productive however she swallows the phlegm before they're able to examine it. They have not noticed any fevers at home.   Past Medical History  Diagnosis Date  . Unspecified adverse effect of unspecified drug, medicinal and biological substance   . Backache, unspecified   . Carpal tunnel syndrome   . Type II or unspecified type diabetes mellitus without mention of complication, not stated as uncontrolled   . Other malaise and fatigue   . Pain in limb   . Esophageal reflux   . Gout, unspecified   . Pure hypercholesterolemia   . Hyperpotassemia   . Unspecified essential hypertension   . Unspecified hypothyroidism   . Jaundice, unspecified, not of newborn   . Memory loss   . Mononeuritis of unspecified site   . Osteoarthrosis, unspecified whether generalized or localized, unspecified site   . Disorder of bone and cartilage, unspecified   . Other chronic pain   . Edema   . Peptic ulcer, unspecified site, unspecified as acute or chronic, without mention of hemorrhage, perforation, or obstruction   . Renal failure, unspecified     secondary to Vioxx  . Chronic pain syndrome   . Unspecified vitamin D deficiency   . Other B-complex deficiencies   . Abnormal pancreatic function study     elevated enzymes  . History  of transfusion of whole blood     Patient Active Problem List   Diagnosis Date Noted  . Skin tear of lower leg without complication 08/07/2014  . Fall at home 08/04/2014  . CHF (congestive heart failure) (HCC) 07/04/2014  . Nocturnal hypoxia 07/04/2014  . Encounter for Medicare annual wellness exam 04/27/2014  . Hypokalemia 05/20/2013  . At risk for falling 12/06/2011  . HYPERKALEMIA 04/23/2010  . ADVERSE DRUG REACTION 03/27/2009  . GOUT 09/25/2008  . OTHER CHRONIC PAIN 08/14/2008  . B12 deficiency 07/02/2008  . Vitamin D deficiency 07/02/2008  . Dementia arising in the senium and presenium 07/02/2008  . FATIGUE 06/25/2008  . BACK PAIN, CHRONIC 08/04/2006  . FOOT PAIN, CHRONIC 08/04/2006  . Osteopenia 08/04/2006  . Hypothyroidism 08/02/2006  . Diabetes type 2, controlled (HCC) 08/02/2006  . HYPERCHOLESTEROLEMIA 08/02/2006  . SYNDROME, CHRONIC PAIN 08/02/2006  . CARPAL TUNNEL SYNDROME 08/02/2006  . NEUROPATHY 08/02/2006  . Essential hypertension 08/02/2006  . GERD 08/02/2006  . PEPTIC ULCER DISEASE 08/02/2006  . Renal failure 08/02/2006  . Osteoarthritis 08/02/2006  . JAUNDICE 08/02/2006  . PEDAL EDEMA 08/02/2006    Past Surgical History  Procedure Laterality Date  . Appendectomy  1928  . Cataract extraction    . Cholecystectomy  1956  . Thyroidectomy      throat surgery-cold nodule  . Tonsillectomy    . Toe amputation      x2 Hammer toes  . Toe nail procedure    . Esophagogastroduodenoscopy  stricture    Current Outpatient Rx  Name  Route  Sig  Dispense  Refill  . amLODipine (NORVASC) 5 MG tablet      TAKE ONE (1) TABLET BY MOUTH EVERY 12 HOURS   180 tablet   3   . food thickener (THICK IT) POWD   Oral   Take 1 Container by mouth as needed.          . furosemide (LASIX) 20 MG tablet   Oral   Take 20 mg by mouth daily.          . insulin detemir (LEVEMIR) 100 UNIT/ML injection   Subcutaneous   Inject 0.12 mLs (12 Units total) into the skin  daily.   10 mL   11   . insulin lispro (HUMALOG KWIKPEN) 100 UNIT/ML KiwkPen      Inject under skin up to 8 units 3x a day 15 min before a meal Patient taking differently: Inject 8 Units into the skin 3 (three) times daily. Inject under skin up to 8 units 3x a day 15 min before a meal   15 mL   11   . Insulin Pen Needle (FIFTY50 PEN NEEDLES) 31G X 5 MM MISC      Use 4x a day Patient taking differently: Use 3x a day   200 each   11   . Insulin Syringe-Needle U-100 (B-D INS SYR ULTRAFINE .5CC/30G) 30G X 1/2" 0.5 ML MISC      Use as directed   100 each   0   . levothyroxine (SYNTHROID, LEVOTHROID) 88 MCG tablet   Oral   Take 1 tablet (88 mcg total) by mouth daily.   30 tablet   11   . lidocaine (LIDODERM) 5 %   Transdermal   Place 1 patch onto the skin daily. Remove & Discard patch within 12 hours or as directed by MD          . omeprazole (PRILOSEC) 20 MG capsule      TAKE ONE CAPSULE BY MOUTH DAILY   90 capsule   3   . potassium chloride (K-DUR,KLOR-CON) 10 MEQ tablet      TAKE 1 TABLET BY MOUTH DAILY WITH A MEAL   30 tablet   0   . ranitidine (ZANTAC) 150 MG tablet      TAKE 1 TABLET BY MOUTH TWICE A DAY Patient taking differently: TAKE 1 TABLET BY MOUTH ONCE DAILY   60 tablet   3   . rivastigmine (EXELON) 4.6 mg/24hr      APPLY 1 PATCH ONTO THE SKIN ONCE A DAY   30 patch   11   . Vitamin D, Ergocalciferol, (DRISDOL) 50000 UNITS CAPS capsule   Oral   Take 50,000 Units by mouth every 7 (seven) days.           Allergies Ace inhibitors; Calcium; Donepezil hydrochloride; Latex; Memantine; Oxycodone-aspirin; Penicillins; Pregabalin; Rofecoxib; and Sulfonamide derivatives  Family History  Problem Relation Age of Onset  . Hyperlipidemia Father   . Transient ischemic attack Son   . Depression Son   . Arthritis Mother   . Arthritis Father   . Arthritis      Grandparents    Social History Social History  Substance Use Topics  . Smoking  status: Never Smoker   . Smokeless tobacco: None  . Alcohol Use: No    Review of Systems  Constitutional: Negative for fever. Cardiovascular: Negative for chest pain. Respiratory: Positive for cough Gastrointestinal: Negative  for abdominal pain, vomiting and diarrhea. Neurological: Negative for headaches, focal weakness or numbness.  10-point ROS otherwise negative.  ____________________________________________   PHYSICAL EXAM:  VITAL SIGNS: ED Triage Vitals  Enc Vitals Group     BP 01/23/15 1200 162/79 mmHg     Pulse Rate 01/23/15 1200 71     Resp 01/23/15 1200 22     Temp --      Temp src --      SpO2 01/23/15 1200 96 %   Constitutional: Alert and oriented. Well appearing and in no distress. Eyes: Conjunctivae are normal. PERRL. Normal extraocular movements. ENT   Head: Normocephalic and atraumatic.   Nose: No congestion/rhinnorhea.   Mouth/Throat: Mucous membranes are moist.   Neck: No stridor. Hematological/Lymphatic/Immunilogical: No cervical lymphadenopathy. Cardiovascular: Normal rate, regular rhythm.  No murmurs, rubs, or gallops.  Respiratory: Normal respiratory effort without tachypnea nor retractions. Breath sounds are clear and equal bilaterally. No wheezes/rales/rhonchi. Occasional cough Gastrointestinal: Soft and nontender. No distention. There is no CVA tenderness. Genitourinary: Deferred Musculoskeletal: Normal range of motion in all extremities. No joint effusions.  No lower extremity tenderness nor edema. Neurologic:  Normal speech and language. No gross focal neurologic deficits are appreciated.  Skin:  Skin is warm, dry and intact. No rash noted. Psychiatric: Mood and affect are normal. Speech and behavior are normal. Patient exhibits appropriate insight and judgment.  ____________________________________________    LABS (pertinent positives/negatives)  Labs Reviewed  BASIC METABOLIC PANEL - Abnormal; Notable for the following:     Chloride 97 (*)    Glucose, Bld 338 (*)    BUN 40 (*)    Creatinine, Ser 1.89 (*)    GFR calc non Af Amer 22 (*)    GFR calc Af Amer 25 (*)    All other components within normal limits  TROPONIN I - Abnormal; Notable for the following:    Troponin I 0.04 (*)    All other components within normal limits  GLUCOSE, CAPILLARY - Abnormal; Notable for the following:    Glucose-Capillary 269 (*)    All other components within normal limits  CBC     ____________________________________________   EKG  I, Phineas SemenGraydon Pilar Westergaard, attending physician, personally viewed and interpreted this EKG  EKG Time: 1048 Rate: 76 Rhythm: NSr Axis: normal Intervals: qtc 456 QRS: narrow ST changes: no st elevation Impression: normal ekg ____________________________________________    RADIOLOGY  CXR  IMPRESSION: Moderate-sized hiatal hernia.  COPD changes without acute infiltrate.  ____________________________________________   PROCEDURES  Procedure(s) performed: None  Critical Care performed: No  ____________________________________________   INITIAL IMPRESSION / ASSESSMENT AND PLAN / ED COURSE  Pertinent labs & imaging results that were available during my care of the patient were reviewed by me and considered in my medical decision making (see chart for details).  Patient brought to the emergency department today by her sons because of concerns for weakness and a cough. On exam patient here is awake alert no acute distress occasionally dry cough. Chest x-ray did not show any pneumonia. Initial blood work shows elevated creatinine which is baseline as well as an elevated troponin. This point I had a discussion with the family. I discussed possibility of heart disease causing the patient's symptoms. Both sons were adamant that they would not want the patient to undergo any stress test or catheterization. We did discuss checking a second troponin which they were amenable to. Second  troponin again was elevated to 0.04. This point I had another long discussion  with sons about goals of care. At this point and again stressed that they would not want any invasive cardiac procedures. At this point no signs of pneumonia other than cough. Will give patient medication for cough. Discussed return precautions. Advised follow-up with primary care. Also give cardiology follow-up.  ____________________________________________   FINAL CLINICAL IMPRESSION(S) / ED DIAGNOSES  Final diagnoses:  Weakness  Cough     Phineas Semen, MD 01/23/15 1654

## 2015-01-23 NOTE — ED Notes (Signed)
MD notified of troponin level  

## 2015-01-23 NOTE — ED Notes (Signed)
Brought in by family with prod cough  And low grade fever since yesterday.  Weakness

## 2015-01-23 NOTE — ED Notes (Signed)
Dr. Derrill KayGoodman notified of critical trop of 0.04

## 2015-01-24 LAB — URINE CULTURE

## 2015-02-04 DIAGNOSIS — I5020 Unspecified systolic (congestive) heart failure: Secondary | ICD-10-CM | POA: Diagnosis not present

## 2015-02-11 ENCOUNTER — Ambulatory Visit: Payer: Medicare Other | Admitting: Cardiovascular Disease

## 2015-02-12 ENCOUNTER — Telehealth: Payer: Self-pay | Admitting: Family Medicine

## 2015-02-12 NOTE — Telephone Encounter (Signed)
Roe CoombsDon wants to let Dr Milinda Antisower know since last weekend pt has been getting up later and later; Roe Coombson said Dr Milinda Antisower has already advised him that pt would continue to get up later and later and O2 sat 98 % P in mid 60s not delusional.  Roe CoombsDon understands this is not unexpected but wanted FYI to Dr Milinda Antisower only so Dr Milinda Antisower will be aware of pts condition. Don request cb on 02/13/15. If pts condition were to change or worsen tonight Roe CoombsDon said he will take pt to ED.

## 2015-02-12 NOTE — Telephone Encounter (Signed)
[facility] Day - Client TELEPHONE ADVICE RECORD [facility] Patient Name: Sara Lang DOB: 09/03/1920 Initial Comment Caller stated he wants to let the physician know of a change in his mother's condition. Caller stated his mother sleep patterns have changed drastically along with a decreased appetite. Nurse Assessment Nurse: Ladona RidgelGaddy, RN, Felicia Date/Time (Eastern Time): 02/12/2015 3:16:11 PM Confirm and document reason for call. If symptomatic, describe symptoms. ---PT has change is sleep habits and appetite since Monday. Getting up later each day - today woke up at 3 pm. She keeps saying "Im tired, Im tired, I don't know what to do" She has alzeimers. Not a good historian. Not feverish. He appetite prior to this was great. Eating a little - snacks here and there. No vomiting. No diarrhea today. He checks her blood sugar if she is acting wierd, normally 2-3x a day. Yesterday it was 235 when she woke at 1:30 pm. Now 233 blood sugar (no shot yet today and has not had food yet today.Has the patient traveled out of the country within the last 30 days? ---No Does the patient have any new or worsening symptoms? ---Yes Will a triage be completed? ---Yes Related visit to physician within the last 2 weeks? ---No Does the PT have any chronic conditions? (i.e. diabetes, asthma, etc.) ---Yes List chronic conditions. ---alzeimers, DM, HTN, Gerd - he usually takes her blood sugar when he gives her insulin at first meal of the day- Yesterday it was 235 when she woke at 1:30 pm Is this a behavioral health or substance abuse call? ---No Guidelines Guideline Title Affirmed Question Affirmed Notes Weakness (Generalized) and Fatigue [1] MODERATE weakness (i.e., interferes with work, school, normal activities) AND [2] cause unknown (Exceptions: weakness with acute minor illness, or weakness from poor fluid intake) Final Disposition User See Physician  within 4 Hours (or PCP triage) Ladona RidgelGaddy, RN, Felicia Comments blood sugar 233 P-68, Oxygen sat is 98 - she is walking at her usual level and just sleeping more - getting up later - not more confused. Son said that MD told him that eventually she would be more bed bound with her alzeimers- he DECLINED to go in tonight but is keeping an eye on her - and may call back. He sounds  very caring.Comments Told son that office will call them back - not sure on exact timing of when they will call. CALLER WANTS A CALLBACK FROM OFFICE SHE HAS FATIGUE RATHER THAN WEAKNESS denies chest pain and leg swelling is not changed since she last saw MD Referrals GO TO FACILITY REFUSED GO TO FACILITY REFUSED Disagree/Comply: Disagree Disagree/Comply Reason: Wait and see Call Id: [phone]

## 2015-02-13 NOTE — Telephone Encounter (Signed)
I'm aware, thanks -keep me posted if any further changes

## 2015-02-13 NOTE — Telephone Encounter (Signed)
Notified son of Dr. Lucretia Roersowers comments, recommened that if any changes happen to call back. Maeola Sarah/tvw

## 2015-03-06 ENCOUNTER — Ambulatory Visit (INDEPENDENT_AMBULATORY_CARE_PROVIDER_SITE_OTHER): Payer: Medicare Other | Admitting: Family Medicine

## 2015-03-06 ENCOUNTER — Encounter: Payer: Self-pay | Admitting: Family Medicine

## 2015-03-06 VITALS — BP 148/82 | HR 68 | Temp 97.6°F | Wt 116.2 lb

## 2015-03-06 DIAGNOSIS — Z794 Long term (current) use of insulin: Secondary | ICD-10-CM | POA: Diagnosis not present

## 2015-03-06 DIAGNOSIS — R32 Unspecified urinary incontinence: Secondary | ICD-10-CM

## 2015-03-06 DIAGNOSIS — R5382 Chronic fatigue, unspecified: Secondary | ICD-10-CM | POA: Diagnosis not present

## 2015-03-06 DIAGNOSIS — E119 Type 2 diabetes mellitus without complications: Secondary | ICD-10-CM | POA: Diagnosis not present

## 2015-03-06 DIAGNOSIS — E039 Hypothyroidism, unspecified: Secondary | ICD-10-CM

## 2015-03-06 DIAGNOSIS — R630 Anorexia: Secondary | ICD-10-CM | POA: Insufficient documentation

## 2015-03-06 DIAGNOSIS — I10 Essential (primary) hypertension: Secondary | ICD-10-CM

## 2015-03-06 DIAGNOSIS — Z23 Encounter for immunization: Secondary | ICD-10-CM | POA: Diagnosis not present

## 2015-03-06 DIAGNOSIS — E538 Deficiency of other specified B group vitamins: Secondary | ICD-10-CM

## 2015-03-06 DIAGNOSIS — R5383 Other fatigue: Secondary | ICD-10-CM | POA: Insufficient documentation

## 2015-03-06 LAB — VITAMIN B12: Vitamin B-12: 389 pg/mL (ref 211–911)

## 2015-03-06 LAB — COMPREHENSIVE METABOLIC PANEL
ALBUMIN: 3.7 g/dL (ref 3.5–5.2)
ALK PHOS: 82 U/L (ref 39–117)
ALT: 6 U/L (ref 0–35)
AST: 12 U/L (ref 0–37)
BILIRUBIN TOTAL: 0.8 mg/dL (ref 0.2–1.2)
BUN: 29 mg/dL — AB (ref 6–23)
CO2: 31 mEq/L (ref 19–32)
CREATININE: 1.73 mg/dL — AB (ref 0.40–1.20)
Calcium: 9.3 mg/dL (ref 8.4–10.5)
Chloride: 99 mEq/L (ref 96–112)
GFR: 29.25 mL/min — ABNORMAL LOW (ref >60.00)
GLUCOSE: 284 mg/dL — AB (ref 70–99)
Potassium: 3.7 mEq/L (ref 3.5–5.1)
SODIUM: 138 meq/L (ref 135–145)
TOTAL PROTEIN: 6.4 g/dL (ref 6.0–8.3)

## 2015-03-06 LAB — HEMOGLOBIN A1C: Hgb A1c MFr Bld: 7.8 % — ABNORMAL HIGH (ref 4.6–6.5)

## 2015-03-06 LAB — TSH: TSH: 0.64 u[IU]/mL (ref 0.35–4.50)

## 2015-03-06 NOTE — Patient Instructions (Signed)
Some of Ms Sara Lang's symptoms are likely due to age and chronic medical problems I also want to rule out a uti -please bring back sample when you can and we will send it for a culture  Flu shot today Labs today

## 2015-03-06 NOTE — Assessment & Plan Note (Signed)
Unusual for pt who is usually a big eater Disc poss etiol incl adv age/dementia/renal failure or other process such as uti Will bring in urine sample when able (given equpt) Lab today

## 2015-03-06 NOTE — Assessment & Plan Note (Signed)
Baseline- but in light of recent fatigue and ms change will check urine cx  Will bring sample when she can give it -given the supplies and son is familiar with method

## 2015-03-06 NOTE — Assessment & Plan Note (Signed)
Lab Results  Component Value Date   HGBA1C 7.5* 08/04/2014   A1C today Recent dec in appetite No excess thirst

## 2015-03-06 NOTE — Assessment & Plan Note (Signed)
tsh today Pt is more tired/sleeping more and less appetite Adj dose as needed

## 2015-03-06 NOTE — Assessment & Plan Note (Addendum)
BP: (!) 148/82 mmHg    Better at home than here  Will continue to monitor  Do not to risk hypotension since she is frail  Lab today

## 2015-03-06 NOTE — Assessment & Plan Note (Signed)
Lab today in light of increased fatigue/sleepiness and memory loss

## 2015-03-06 NOTE — Assessment & Plan Note (Signed)
Sleeping more  C/o fatigue more Lab incl tsh and a1c and B12 today Could be due to adv age and worsening dementia also

## 2015-03-06 NOTE — Progress Notes (Signed)
Pre visit review using our clinic review tool, if applicable. No additional management support is needed unless otherwise documented below in the visit note. 

## 2015-03-06 NOTE — Progress Notes (Signed)
Subjective:    Patient ID: Sara Lang, female    DOB: 09/25/1920, 82 y.o.   MRN: [id number]  HPI Here for f/u of chronic medical conditions   Loosing her appetite -this is highly unusual for her  Wt is mt however  Will still sneak sweets - and has to watch blood sugar for that   Is tired all the time and sleeping more  Goes to bed 5:30-6 pm and sleeps all night  Does have incontinence at night-wears undergarments  Sleeping later and more during the day  One day she did not get up until 2 pm   On exelon patch for dementia   Agitation occasionally   Getting medicines on time and no problems with that   More OCD activity/ to the point of compulsion   Gait is slower (uses walker)  Less stable  Has a wheelchair for traveling as well and 2 kinds of walkers   Is open to checking labs today No change in urine odor or color   Patient Active Problem List   Diagnosis Date Noted  . Decreased appetite 03/06/2015  . Fatigue 03/06/2015  . Incontinence 03/06/2015  . Skin tear of lower leg without complication 08/07/2014  . Fall at home 08/04/2014  . CHF (congestive heart failure) (HCC) 07/04/2014  . Nocturnal hypoxia 07/04/2014  . Encounter for Medicare annual wellness exam 04/27/2014  . Hypokalemia 05/20/2013  . At risk for falling 12/06/2011  . HYPERKALEMIA 04/23/2010  . ADVERSE DRUG REACTION 03/27/2009  . GOUT 09/25/2008  . OTHER CHRONIC PAIN 08/14/2008  . B12 deficiency 07/02/2008  . Vitamin D deficiency 07/02/2008  . Dementia arising in the senium and presenium 07/02/2008  . FATIGUE 06/25/2008  . BACK PAIN, CHRONIC 08/04/2006  . FOOT PAIN, CHRONIC 08/04/2006  . Osteopenia 08/04/2006  . Hypothyroidism 08/02/2006  . Diabetes type 2, controlled (HCC) 08/02/2006  . HYPERCHOLESTEROLEMIA 08/02/2006  . SYNDROME, CHRONIC PAIN 08/02/2006  . CARPAL TUNNEL SYNDROME 08/02/2006  . NEUROPATHY 08/02/2006  . Essential hypertension 08/02/2006  . GERD 08/02/2006  . PEPTIC ULCER  DISEASE 08/02/2006  . Renal failure 08/02/2006  . Osteoarthritis 08/02/2006  . JAUNDICE 08/02/2006  . PEDAL EDEMA 08/02/2006   Past Medical History  Diagnosis Date  . Unspecified adverse effect of unspecified drug, medicinal and biological substance   . Backache, unspecified   . Carpal tunnel syndrome   . Type II or unspecified type diabetes mellitus without mention of complication, not stated as uncontrolled   . Other malaise and fatigue   . Pain in limb   . Esophageal reflux   . Gout, unspecified   . Pure hypercholesterolemia   . Hyperpotassemia   . Unspecified essential hypertension   . Unspecified hypothyroidism   . Jaundice, unspecified, not of newborn   . Memory loss   . Mononeuritis of unspecified site   . Osteoarthrosis, unspecified whether generalized or localized, unspecified site   . Disorder of bone and cartilage, unspecified   . Other chronic pain   . Edema   . Peptic ulcer, unspecified site, unspecified as acute or chronic, without mention of hemorrhage, perforation, or obstruction   . Renal failure, unspecified     secondary to Vioxx  . Chronic pain syndrome   . Unspecified vitamin D deficiency   . Other B-complex deficiencies   . Abnormal pancreatic function study     elevated enzymes  . History of transfusion of whole blood    Past Surgical History  Procedure Laterality Date  .  Appendectomy  1928  . Cataract extraction    . Cholecystectomy  1956  . Thyroidectomy      throat surgery-cold nodule  . Tonsillectomy    . Toe amputation      x2 Hammer toes  . Toe nail procedure    . Esophagogastroduodenoscopy      stricture   Social History  Substance Use Topics  . Smoking status: Never Smoker   . Smokeless tobacco: None  . Alcohol Use: No   Family History  Problem Relation Age of Onset  . Hyperlipidemia Father   . Transient ischemic attack Son   . Depression Son   . Arthritis Mother   . Arthritis Father   . Arthritis      Grandparents    Allergies  Allergen Reactions  . Ace Inhibitors Other (See Comments)    Reaction: ace inhibitors cause inc K-  . Calcium Nausea Only  . Donepezil Hydrochloride Other (See Comments)    Reaction: MS change  . Latex Itching and Other (See Comments)    Reaction: Red skin  . Memantine Other (See Comments)    Reaction: MS change  . Oxycodone-Aspirin Other (See Comments)    Reaction: Unknown  . Penicillins Other (See Comments)    Reaction: Unknown  . Pregabalin Nausea And Vomiting  . Rofecoxib Other (See Comments)    Reaction: Renal failure  . Sulfonamide Derivatives Other (See Comments)    Reaction: Unknown   Current Outpatient Prescriptions on File Prior to Visit  Medication Sig Dispense Refill  . amLODipine (NORVASC) 5 MG tablet TAKE ONE (1) TABLET BY MOUTH EVERY 12 HOURS 180 tablet 3  . food thickener (THICK IT) POWD Take 1 Container by mouth as needed.     . furosemide (LASIX) 20 MG tablet Take 20 mg by mouth daily.     . insulin detemir (LEVEMIR) 100 UNIT/ML injection Inject 0.12 mLs (12 Units total) into the skin daily. 10 mL 11  . insulin lispro (HUMALOG KWIKPEN) 100 UNIT/ML KiwkPen Inject under skin up to 8 units 3x a day 15 min before a meal (Patient taking differently: Inject 8 Units into the skin 3 (three) times daily. Inject under skin up to 8 units 3x a day 15 min before a meal) 15 mL 11  . Insulin Syringe-Needle U-100 (B-D INS SYR ULTRAFINE .5CC/30G) 30G X 1/2" 0.5 ML MISC Use as directed 100 each 0  . levothyroxine (SYNTHROID, LEVOTHROID) 88 MCG tablet Take 1 tablet (88 mcg total) by mouth daily. 30 tablet 11  . lidocaine (LIDODERM) 5 % Place 1 patch onto the skin daily. Remove & Discard patch within 12 hours or as directed by MD     . omeprazole (PRILOSEC) 20 MG capsule TAKE ONE CAPSULE BY MOUTH DAILY 90 capsule 3  . potassium chloride (K-DUR,KLOR-CON) 10 MEQ tablet TAKE 1 TABLET BY MOUTH DAILY WITH A MEAL 30 tablet 0  . ranitidine (ZANTAC) 150 MG tablet TAKE 1 TABLET  BY MOUTH TWICE A DAY (Patient taking differently: TAKE 1 TABLET BY MOUTH ONCE DAILY) 60 tablet 3  . rivastigmine (EXELON) 4.6 mg/24hr APPLY 1 PATCH ONTO THE SKIN ONCE A DAY 30 patch 11  . Vitamin D, Ergocalciferol, (DRISDOL) 50000 UNITS CAPS capsule Take 50,000 Units by mouth every 7 (seven) days.    . [DISCONTINUED] esomeprazole (NEXIUM) 40 MG capsule Take 1 capsule (40 mg total) by mouth 2 (two) times daily. 60 capsule 11   No current facility-administered medications on file prior to visit.  Review of Systems Review of Systems  Constitutional: Negative for fever,  and unexpected weight change. pos for fatigue and dec appetite  Eyes: Negative for pain and visual disturbance.  Respiratory: Negative for cough and shortness of breath.   Cardiovascular: Negative for cp or palpitations    Gastrointestinal: Negative for nausea, diarrhea and constipation.  Genitourinary: Negative for urgency and frequency.  Skin: Negative for pallor or rash   Neurological: Negative for weakness, light-headedness, numbness and headaches.  Hematological: Negative for adenopathy. Does not bruise/bleed easily.  Psychiatric/Behavioral: Negative for dysphoric mood. The patient is occ agitated, pos for progressive dementia with some compulsive behaviors         Objective:   Physical Exam  Constitutional: She appears well-developed and well-nourished. No distress.  Frail app elderly female in wheelchair   HENT:  Head: Normocephalic and atraumatic.  Mouth/Throat: Oropharynx is clear and moist.  Eyes: Conjunctivae and EOM are normal. Pupils are equal, round, and reactive to light.  Neck: Normal range of motion. Neck supple. No JVD present. Carotid bruit is not present. No thyromegaly present.  Cardiovascular: Normal rate, regular rhythm, normal heart sounds and intact distal pulses.  Exam reveals no gallop.   Pulmonary/Chest: Effort normal and breath sounds normal. No respiratory distress. She has no wheezes. She  has no rales.  No crackles  Abdominal: Soft. Bowel sounds are normal. She exhibits no distension, no abdominal bruit and no mass. There is no tenderness.  Musculoskeletal: She exhibits no edema.  Signs of adv OA in hands and feet Pt persistently wrings her hands     Lymphadenopathy:    She has no cervical adenopathy.  Neurological: She is alert. She has normal reflexes. No cranial nerve deficit. She exhibits normal muscle tone.  Skin: Skin is warm and dry. No rash noted. No pallor.  Psychiatric:  Calm with dementia  occ babbles to herself Answers some questions when asked            Assessment & Plan:   Problem List Items Addressed This Visit      Cardiovascular and Mediastinum   Essential hypertension - Primary    BP: (!) 148/82 mmHg    Better at home than here  Will continue to monitor  Do not to risk hypotension since she is frail  Lab today       Relevant Orders   Comprehensive metabolic panel   TSH     Digestive   B12 deficiency    Lab today in light of increased fatigue/sleepiness and memory loss      Relevant Orders   Vitamin B12     Endocrine   Diabetes type 2, controlled (HCC)    Lab Results  Component Value Date   HGBA1C 7.5* 08/04/2014   A1C today Recent dec in appetite No excess thirst      Relevant Orders   Hemoglobin A1c   Hypothyroidism    tsh today Pt is more tired/sleeping more and less appetite Adj dose as needed         Other   Decreased appetite    Unusual for pt who is usually a big eater Disc poss etiol incl adv age/dementia/renal failure or other process such as uti Will bring in urine sample when able (given equpt) Lab today      Fatigue    Sleeping more  C/o fatigue more Lab incl tsh and a1c and B12 today Could be due to adv age and worsening dementia also  Relevant Orders   Comprehensive metabolic panel   TSH   Incontinence    Baseline- but in light of recent fatigue and ms change will check urine  cx  Will bring sample when she can give it -given the supplies and son is familiar with method

## 2015-03-07 DIAGNOSIS — I5020 Unspecified systolic (congestive) heart failure: Secondary | ICD-10-CM | POA: Diagnosis not present

## 2015-03-09 ENCOUNTER — Encounter: Payer: Self-pay | Admitting: *Deleted

## 2015-04-02 ENCOUNTER — Other Ambulatory Visit: Payer: Self-pay | Admitting: Family Medicine

## 2015-04-03 ENCOUNTER — Ambulatory Visit: Payer: Medicare Other | Admitting: Cardiovascular Disease

## 2015-04-07 DIAGNOSIS — I5020 Unspecified systolic (congestive) heart failure: Secondary | ICD-10-CM | POA: Diagnosis not present

## 2015-05-05 DIAGNOSIS — I5020 Unspecified systolic (congestive) heart failure: Secondary | ICD-10-CM | POA: Diagnosis not present

## 2015-05-13 ENCOUNTER — Telehealth: Payer: Self-pay | Admitting: Family Medicine

## 2015-05-13 NOTE — Telephone Encounter (Signed)
done

## 2015-05-13 NOTE — Telephone Encounter (Signed)
[facility] - Client TELEPHONE ADVICE RECORD [facility]  Patient Name: Sara MartenMARY Lang  DOB: 11/04/1920    Initial Comment Caller States his mother has Alzheimer's, over the last few days her appetite has diminished in half, wants to know if there is a supplement he can give her to help   Nurse Assessment  Nurse: Dorthula RuePatten, RN, Enrique SackKendra Date/Time (Eastern Time): 05/13/2015 9:21:00 AM  Confirm and document reason for call. If symptomatic, describe symptoms. You must click the next button to save text entered. ---Caller states her appetite has decreased a lot. She is eating one meal a day and it's enough to keep her full. She is eating a few snacks. She has Alzheimer's per caller. She is coughing when she is eating just a bit  Has the patient traveled out of the country within the last 30 days? ---Not Applicable  Does the patient have any new or worsening symptoms? ---Yes  Will a triage be completed? ---Yes  Related visit to physician within the last 2 weeks? ---No  Does the PT have any chronic conditions? (i.e. diabetes, asthma, etc.) ---Yes  List chronic conditions. ---Diabetes, Renal issues, Alzheimer's, HTN  Is this a behavioral health or substance abuse call? ---No     Guidelines    Guideline Title Affirmed Question Affirmed Notes  Nausea Nausea persists > 1 week    Final Disposition User   See PCP When Office is Open (within 3 days) Dorthula RuePatten, RN, Enrique SackKendra    Comments  Told caller he could start mother on Boost or Ensure once a day until she sees the MD. Scheduled with Dr Milinda Antisower on 03/24 at 1145a   Referrals  REFERRED TO PCP OFFICE   Disagree/Comply: Comply

## 2015-05-13 NOTE — Telephone Encounter (Signed)
Pt has appt with Dr Milinda Antisower on 05/15/15 at 11:45.

## 2015-05-13 NOTE — Telephone Encounter (Signed)
Please make sure this is blocked as a 30 min appt thanks

## 2015-05-15 ENCOUNTER — Encounter: Payer: Self-pay | Admitting: Family Medicine

## 2015-05-15 ENCOUNTER — Ambulatory Visit (INDEPENDENT_AMBULATORY_CARE_PROVIDER_SITE_OTHER): Payer: Medicare Other | Admitting: Family Medicine

## 2015-05-15 VITALS — BP 122/70 | HR 61 | Temp 97.4°F | Ht <= 58 in | Wt 118.5 lb

## 2015-05-15 DIAGNOSIS — R630 Anorexia: Secondary | ICD-10-CM

## 2015-05-15 DIAGNOSIS — Z794 Long term (current) use of insulin: Secondary | ICD-10-CM

## 2015-05-15 DIAGNOSIS — F068 Other specified mental disorders due to known physiological condition: Secondary | ICD-10-CM | POA: Diagnosis not present

## 2015-05-15 DIAGNOSIS — F0390 Unspecified dementia without behavioral disturbance: Secondary | ICD-10-CM

## 2015-05-15 DIAGNOSIS — E119 Type 2 diabetes mellitus without complications: Secondary | ICD-10-CM

## 2015-05-15 DIAGNOSIS — N19 Unspecified kidney failure: Secondary | ICD-10-CM

## 2015-05-15 NOTE — Progress Notes (Signed)
Pre visit review using our clinic review tool, if applicable. No additional management support is needed unless otherwise documented below in the visit note. 

## 2015-05-15 NOTE — Progress Notes (Signed)
Subjective:    Patient ID: Sara Lang, female    DOB: [birth year], 82 y.o.   MRN: [id number]  HPI Here with further concerns of no appetite   Not loosing weight  - in fact she is up 2 lb with bmi of 24    Does not eat very much during the day Eats very slowly Coughs easily -with water (has thicken up for her fluids)  No s/s of stroke -son thinks she has forgotten how to swallow efficiently  Declines a swallowing study    Dementia -stable  No uti symptoms    Wt is up 2lb with bmi of 24  Son looked into ensure and glucerna  Bought her glucerna chocolate shakes - higher protein -this has helped level out her blood sugar readings   levemir was lowering sugar too much - so he held it  Is doing well with sliding scale humulin - does that once per day Glucose runs in 160s     Office Visit on 03/06/2015  Component Date Value Ref Range Status  . Sodium 03/06/2015 138  135 - 145 mEq/L Final  . Potassium 03/06/2015 3.7  3.5 - 5.1 mEq/L Final  . Chloride 03/06/2015 99  96 - 112 mEq/L Final  . CO2 03/06/2015 31  19 - 32 mEq/L Final  . Glucose, Bld 03/06/2015 284* 70 - 99 mg/dL Final  . BUN 78/29/5621 29* 6 - 23 mg/dL Final  . Creatinine, Ser 03/06/2015 1.73* 0.40 - 1.20 mg/dL Final  . Total Bilirubin 03/06/2015 0.8  0.2 - 1.2 mg/dL Final  . Alkaline Phosphatase 03/06/2015 82  39 - 117 U/L Final  . AST 03/06/2015 12  0 - 37 U/L Final  . ALT 03/06/2015 6  0 - 35 U/L Final  . Total Protein 03/06/2015 6.4  6.0 - 8.3 g/dL Final  . Albumin 30/86/5784 3.7  3.5 - 5.2 g/dL Final  . Calcium 69/62/9528 9.3  8.4 - 10.5 mg/dL Final  . GFR 41/32/4401 29.25* >60.00 mL/min Final  . TSH 03/06/2015 0.64  0.35 - 4.50 uIU/mL Final  . Hgb A1c MFr Bld 03/06/2015 7.8* 4.6 - 6.5 % Final   Glycemic Control Guidelines for People with Diabetes:Non Diabetic:  <6%Goal of Therapy: <7%Additional Action Suggested:  >8%   . Vitamin B-12 03/06/2015 389  211 - 911 pg/mL Final    Review of Systems Review  of Systems  Constitutional: Negative for fever, appetite change,  and unexpected weight change.  Eyes: Negative for pain and visual disturbance.  Respiratory: Negative for cough and shortness of breath.   Cardiovascular: Negative for cp or palpitations    Gastrointestinal: Negative for nausea, diarrhea and constipation.  Genitourinary: Negative for urgency and frequency.  MSK pos for chronic arthritis pain  Skin: Negative for pallor or rash   Neurological: Negative for weakness, light-headedness, numbness and headaches.  Hematological: Negative for adenopathy. Does not bruise/bleed easily.  Psychiatric/Behavioral: Negative for dysphoric mood. The patient is not nervous/anxious.  pos for worsening dementia        Objective:   Physical Exam  Constitutional: She appears well-developed and well-nourished. No distress.  Frail appearing elderly female with dementia    HENT:  Head: Normocephalic and atraumatic.  Mouth/Throat: Oropharynx is clear and moist.  Eyes: Conjunctivae and EOM are normal. Pupils are equal, round, and reactive to light.  Neck: Normal range of motion. Neck supple. No JVD present. Carotid bruit is not present. No thyromegaly present.  Cardiovascular: Normal rate, regular rhythm,  normal heart sounds and intact distal pulses.  Exam reveals no gallop.   Pulmonary/Chest: Effort normal and breath sounds normal. No respiratory distress. She has no wheezes. She has no rales.  No crackles  Abdominal: Soft. Bowel sounds are normal. She exhibits no distension, no abdominal bruit and no mass. There is no tenderness.  Musculoskeletal: She exhibits no edema.  Lymphadenopathy:    She has no cervical adenopathy.  Neurological: She is alert. She has normal reflexes.  Skin: Skin is warm and dry. No rash noted.  Psychiatric: Cognition and memory are not impaired.  Pleasant and content  Repeats herself and chants quietly at times while rocking in her chair            Assessment  & Plan:   Problem List Items Addressed This Visit      Endocrine   Diabetes type 2, controlled (HCC) - Primary    Per caregiver- with eating less but supplementing glucerna-her blood glucose levels are much more stable  Stopped levemir due to hypoglycemia with less po intake Continue to follow         Nervous and Auditory   Dementia arising in the senium and presenium    Decreased appetite -no doubt dementia is playing a role  Wt is stable  Will watch carefully        Genitourinary   Renal failure    May be adding to dec appetite Last cr was improved however  Pt does not want tx / comfort care only  Enc fluids Continue to follow         Other   Decreased appetite    Suspect multifactorial with dementia and renal failure and adv age (suspect geriatric failure to thrive) No overt signs of depression  Caregiver is suppl with glucerna-this works out well and wt is stable  If wt drops or this worsens-she declines feeding tube or artificial nutrition  Could consider megace or mirtazapine in future if needed -right now do not feel like benefit would outweigh risks

## 2015-05-15 NOTE — Patient Instructions (Signed)
Keep an eye on swallowing and weight  Weight is stable (in fact up 2 lb today) Decrease in appetite may be age and dementia and kidney related  Last labs from January are stable Stay off levemir if she does better off of it -keep watching blood sugars  Continue glucerna   Keep me posted

## 2015-05-16 NOTE — Assessment & Plan Note (Signed)
Suspect multifactorial with dementia and renal failure and adv age (suspect geriatric failure to thrive) No overt signs of depression  Caregiver is suppl with glucerna-this works out well and wt is stable  If wt drops or this worsens-she declines feeding tube or artificial nutrition  Could consider megace or mirtazapine in future if needed -right now do not feel like benefit would outweigh risks

## 2015-05-16 NOTE — Assessment & Plan Note (Signed)
May be adding to dec appetite Last cr was improved however  Pt does not want tx / comfort care only  Enc fluids Continue to follow

## 2015-05-16 NOTE — Assessment & Plan Note (Signed)
Decreased appetite -no doubt dementia is playing a role  Wt is stable  Will watch carefully

## 2015-05-16 NOTE — Assessment & Plan Note (Signed)
Per caregiver- with eating less but supplementing glucerna-her blood glucose levels are much more stable  Stopped levemir due to hypoglycemia with less po intake Continue to follow

## 2015-06-05 DIAGNOSIS — I5020 Unspecified systolic (congestive) heart failure: Secondary | ICD-10-CM | POA: Diagnosis not present

## 2015-06-08 ENCOUNTER — Other Ambulatory Visit: Payer: Self-pay | Admitting: Family Medicine

## 2015-06-08 NOTE — Telephone Encounter (Signed)
Electronically refill request for

## 2015-07-05 DIAGNOSIS — I5020 Unspecified systolic (congestive) heart failure: Secondary | ICD-10-CM | POA: Diagnosis not present

## 2015-07-22 ENCOUNTER — Telehealth: Payer: Self-pay | Admitting: Family Medicine

## 2015-07-22 DIAGNOSIS — F0390 Unspecified dementia without behavioral disturbance: Secondary | ICD-10-CM

## 2015-07-22 DIAGNOSIS — E119 Type 2 diabetes mellitus without complications: Secondary | ICD-10-CM

## 2015-07-22 DIAGNOSIS — Z794 Long term (current) use of insulin: Secondary | ICD-10-CM

## 2015-07-22 DIAGNOSIS — R197 Diarrhea, unspecified: Secondary | ICD-10-CM

## 2015-07-22 DIAGNOSIS — N19 Unspecified kidney failure: Secondary | ICD-10-CM

## 2015-07-22 DIAGNOSIS — I509 Heart failure, unspecified: Secondary | ICD-10-CM

## 2015-07-22 DIAGNOSIS — R627 Adult failure to thrive: Secondary | ICD-10-CM

## 2015-07-22 NOTE — Telephone Encounter (Signed)
I am going forward with a hospice referral -please let her son know Thanks for keeping me updated

## 2015-07-22 NOTE — Telephone Encounter (Signed)
Patient Name: Sara MartenMARY Lang DOB: 04/13/1920 Initial Comment Caller states mother has Alzheimer's, has started sleeping 18-19 hours a day, only eating one meal daily. She is very weak, health has gone down since dr saw her last Nurse Assessment Nurse: Yetta BarreJones, RN, Miranda Date/Time (Eastern Time): 07/22/2015 10:36:57 AM Confirm and document reason for call. If symptomatic, describe symptoms. You must click the next button to save text entered. ---Caller states his mother has Alzheimer's and she is sleeping most of the day and decreased appetite even more. Only eating 1 meal a day. He states she is too week to take her to the office. He states he is just calling to notify MD of the changes. Has the patient traveled out of the country within the last 30 days? ---Not Applicable Does the patient have any new or worsening symptoms? ---Yes Will a triage be completed? ---No Select reason for no triage. ---Other Please document clinical information provided and list any resource used. ---Did not triage because caller states he is just calling to update the MD regarding changes. He also wants to know if the doctor could refer her to home health/hospice. Guidelines Guideline Title Affirmed Question Affirmed Notes Final Disposition User Clinical Call Yetta BarreJones, RN, [employer]

## 2015-07-23 ENCOUNTER — Telehealth: Payer: Self-pay | Admitting: Family Medicine

## 2015-07-23 NOTE — Telephone Encounter (Signed)
For some reason it was on the [facility] WQ. I've changed it and will work on it now. Thanks!

## 2015-07-23 NOTE — Telephone Encounter (Signed)
I will call pt son today. I do not see referral in my WQ. Can you please enter?

## 2015-07-23 NOTE — Telephone Encounter (Signed)
alzheimers

## 2015-07-23 NOTE — Telephone Encounter (Signed)
I notified Amil AmenJulia.

## 2015-07-23 NOTE — Telephone Encounter (Signed)
Thanks - that is weird- wonder how I accomplished that?   Thanks so much!

## 2015-07-23 NOTE — Telephone Encounter (Signed)
The referral is in epic from 5/31- I must have forgotten to route or routed to [place], how do I route it to you now that it is done?

## 2015-07-23 NOTE — Telephone Encounter (Signed)
Patient was referred to Hospice.  Sara Lang,Hospice, called to find out what type of Dementia.

## 2015-07-23 NOTE — Telephone Encounter (Signed)
Spoke with referral intake personal/Deborah at [facility]. Sara Lang, their NP went to home today and accessed the situation. Sara Lang states pt is not appropriate for hospice services at this time. Pt son, Sara Lang and Sara Lang aware and verbalized understanding. They did not give a reason as to why she was not appropriate. If you would like to speak with Sara Lang, you can call the main office and leave a message to have her call you back. Main # [phone].  Sara Lang

## 2015-07-24 NOTE — Telephone Encounter (Signed)
I am aware-thanks - just encourage her to eat when she can and sleep when she wants to.  Keep me posted for changes.  These sort of things are to be expected with her medical problems.

## 2015-07-24 NOTE — Telephone Encounter (Signed)
[name] voicemail letting him know Dr. Royden Purlower's comments

## 2015-07-24 NOTE — Telephone Encounter (Signed)
Dom said pt is eating a "good meal" once a day and then sleeps the rest of the time. Dom wanted Dr Milinda Antisower to know that pt is not Hospice appropriate yet. FYI to Dr Milinda Antisower.

## 2015-07-30 ENCOUNTER — Other Ambulatory Visit: Payer: Self-pay | Admitting: Family Medicine

## 2015-07-30 NOTE — Telephone Encounter (Signed)
Last lasix Rx I could see in chart was in 2015, should pt still be on med, please advise

## 2015-07-30 NOTE — Telephone Encounter (Signed)
Please verify re: the lasix with her son  If she is on it- let me know and refill both times 5

## 2015-08-05 DIAGNOSIS — I5020 Unspecified systolic (congestive) heart failure: Secondary | ICD-10-CM | POA: Diagnosis not present

## 2015-08-06 NOTE — Telephone Encounter (Signed)
Rx filled, son said that she is taking 1/2 tabs daily, the only reason we haven't filled it lately is because pt used mail order pharmacy and they shipped to much med to pt so he had a lot of med leftover and she was only taking 1/2 tabs and they gave her 90 pills

## 2015-09-04 DIAGNOSIS — I5020 Unspecified systolic (congestive) heart failure: Secondary | ICD-10-CM | POA: Diagnosis not present

## 2015-09-14 ENCOUNTER — Emergency Department
Admission: EM | Admit: 2015-09-14 | Discharge: 2015-09-14 | Disposition: A | Discharge status: Home / Self Care | Payer: Medicare Other | Attending: Emergency Medicine | Admitting: Emergency Medicine

## 2015-09-14 ENCOUNTER — Telehealth: Payer: Self-pay | Admitting: Family Medicine

## 2015-09-14 ENCOUNTER — Encounter: Payer: Self-pay | Admitting: Emergency Medicine

## 2015-09-14 DIAGNOSIS — N390 Urinary tract infection, site not specified: Secondary | ICD-10-CM | POA: Diagnosis not present

## 2015-09-14 DIAGNOSIS — E119 Type 2 diabetes mellitus without complications: Secondary | ICD-10-CM | POA: Diagnosis not present

## 2015-09-14 DIAGNOSIS — E039 Hypothyroidism, unspecified: Secondary | ICD-10-CM | POA: Insufficient documentation

## 2015-09-14 DIAGNOSIS — I10 Essential (primary) hypertension: Secondary | ICD-10-CM | POA: Diagnosis not present

## 2015-09-14 DIAGNOSIS — R4182 Altered mental status, unspecified: Secondary | ICD-10-CM | POA: Insufficient documentation

## 2015-09-14 DIAGNOSIS — Z79899 Other long term (current) drug therapy: Secondary | ICD-10-CM | POA: Insufficient documentation

## 2015-09-14 DIAGNOSIS — R531 Weakness: Secondary | ICD-10-CM | POA: Diagnosis not present

## 2015-09-14 DIAGNOSIS — Z794 Long term (current) use of insulin: Secondary | ICD-10-CM | POA: Insufficient documentation

## 2015-09-14 DIAGNOSIS — R400 Somnolence: Secondary | ICD-10-CM | POA: Diagnosis not present

## 2015-09-14 LAB — URINALYSIS COMPLETE WITH MICROSCOPIC (ARMC ONLY)
BILIRUBIN URINE: NEGATIVE
GLUCOSE, UA: NEGATIVE mg/dL
KETONES UR: NEGATIVE mg/dL
NITRITE: NEGATIVE
PROTEIN: 30 mg/dL — AB
SPECIFIC GRAVITY, URINE: 1.015 (ref 1.005–1.030)
pH: 5 (ref 5.0–8.0)

## 2015-09-14 LAB — CBC
HCT: 41.6 % (ref 35.0–47.0)
HEMOGLOBIN: 14.3 g/dL (ref 12.0–16.0)
MCH: 32.8 pg (ref 26.0–34.0)
MCHC: 34.4 g/dL (ref 32.0–36.0)
MCV: 95.2 fL (ref 80.0–100.0)
PLATELETS: 215 10*3/uL (ref 150–440)
RBC: 4.37 MIL/uL (ref 3.80–5.20)
RDW: 13.8 % (ref 11.5–14.5)
WBC: 8.6 10*3/uL (ref 3.6–11.0)

## 2015-09-14 LAB — COMPREHENSIVE METABOLIC PANEL
ALBUMIN: 3.9 g/dL (ref 3.5–5.0)
ALK PHOS: 90 U/L (ref 38–126)
ALT: 9 U/L — AB (ref 14–54)
ANION GAP: 7 (ref 5–15)
AST: 16 U/L (ref 15–41)
BILIRUBIN TOTAL: 0.7 mg/dL (ref 0.3–1.2)
BUN: 31 mg/dL — ABNORMAL HIGH (ref 6–20)
CALCIUM: 8.9 mg/dL (ref 8.9–10.3)
CO2: 30 mmol/L (ref 22–32)
CREATININE: 1.61 mg/dL — AB (ref 0.44–1.00)
Chloride: 103 mmol/L (ref 101–111)
GFR calc non Af Amer: 27 mL/min — ABNORMAL LOW (ref >60)
GFR, EST AFRICAN AMERICAN: 31 mL/min — AB (ref >60)
GLUCOSE: 183 mg/dL — AB (ref 65–99)
Potassium: 3.7 mmol/L (ref 3.5–5.1)
Sodium: 140 mmol/L (ref 135–145)
TOTAL PROTEIN: 6.4 g/dL — AB (ref 6.5–8.1)

## 2015-09-14 LAB — LIPASE, BLOOD: Lipase: 41 U/L (ref 11–51)

## 2015-09-14 MED ORDER — CEPHALEXIN 500 MG PO CAPS: 500.0000 mg | ORAL_CAPSULE | Freq: Three times a day (TID) | ORAL | 0 refills | Status: DC

## 2015-09-14 MED ORDER — DEXTROSE 5 % IV SOLN
1.0000 g | Freq: Once | INTRAVENOUS | Status: AC
  Administered 2015-09-14: 1 g via INTRAVENOUS
  Filled 2015-09-14: qty 10

## 2015-09-14 NOTE — ED Provider Notes (Signed)
[facility] Emergency Department Provider Note  Time seen: 4:12 AM  I have reviewed the triage vital signs and the nursing notes.   HISTORY  Chief Complaint Altered Mental Status and Abdominal Pain    HPI Sara Lang is a 82 y.o. female with a past medical history of diabetes, hyperlipidemia, hypertension, chronic kidney disease, who presents to the emergency department with generalized weakness. According to the family with whom the patient lives with the past 2 days the patient has been increasingly somnolent, sleeping more than normal and very weak. The patient is alert, awake, very pleasant, and has no complaints.     Past Medical History:  Diagnosis Date  . Abnormal pancreatic function study    elevated enzymes  . Backache, unspecified   . Carpal tunnel syndrome   . Chronic pain syndrome   . Disorder of bone and cartilage, unspecified   . Edema   . Esophageal reflux   . Gout, unspecified   . History of transfusion of whole blood   . Hyperpotassemia   . Jaundice, unspecified, not of newborn   . Memory loss   . Mononeuritis of unspecified site   . Osteoarthrosis, unspecified whether generalized or localized, unspecified site   . Other B-complex deficiencies   . Other chronic pain   . Other malaise and fatigue   . Pain in limb   . Peptic ulcer, unspecified site, unspecified as acute or chronic, without mention of hemorrhage, perforation, or obstruction   . Pure hypercholesterolemia   . Renal failure, unspecified    secondary to Vioxx  . Type II or unspecified type diabetes mellitus without mention of complication, not stated as uncontrolled   . Unspecified adverse effect of unspecified drug, medicinal and biological substance   . Unspecified essential hypertension   . Unspecified hypothyroidism   . Unspecified vitamin D deficiency     Patient Active Problem List   Diagnosis Date Noted  . FTT (failure to thrive) in adult 07/22/2015  .  Decreased appetite 03/06/2015  . Fatigue 03/06/2015  . Incontinence 03/06/2015  . Skin tear of lower leg without complication 08/07/2014  . Fall at home 08/04/2014  . CHF (congestive heart failure) (HCC) 07/04/2014  . Nocturnal hypoxia 07/04/2014  . Encounter for Medicare annual wellness exam 04/27/2014  . Hypokalemia 05/20/2013  . At risk for falling 12/06/2011  . HYPERKALEMIA 04/23/2010  . ADVERSE DRUG REACTION 03/27/2009  . GOUT 09/25/2008  . OTHER CHRONIC PAIN 08/14/2008  . B12 deficiency 07/02/2008  . Vitamin D deficiency 07/02/2008  . Dementia arising in the senium and presenium 07/02/2008  . FATIGUE 06/25/2008  . BACK PAIN, CHRONIC 08/04/2006  . FOOT PAIN, CHRONIC 08/04/2006  . Osteopenia 08/04/2006  . Hypothyroidism 08/02/2006  . Diabetes type 2, controlled (HCC) 08/02/2006  . HYPERCHOLESTEROLEMIA 08/02/2006  . SYNDROME, CHRONIC PAIN 08/02/2006  . CARPAL TUNNEL SYNDROME 08/02/2006  . NEUROPATHY 08/02/2006  . Essential hypertension 08/02/2006  . GERD 08/02/2006  . PEPTIC ULCER DISEASE 08/02/2006  . Renal failure 08/02/2006  . Osteoarthritis 08/02/2006  . JAUNDICE 08/02/2006  . PEDAL EDEMA 08/02/2006    Past Surgical History:  Procedure Laterality Date  . APPENDECTOMY  1928  . CATARACT EXTRACTION    . CHOLECYSTECTOMY  1956  . ESOPHAGOGASTRODUODENOSCOPY     stricture  . THYROIDECTOMY     throat surgery-cold nodule  . TOE AMPUTATION     x2 Hammer toes  . TOE AMPUTATION    . toe nail procedure  [facility] .  TONSILLECTOMY      Current Outpatient Rx  . Order #: [id number] Class: Normal  . Order #: [id number] Class: Historical Med  . Order #: [id number] Class: Historical Med  . Order #: [id number] Class: Normal  . Order #: [id number] Class: Normal  . Order #: [id number] Class: Normal  . Order #: [id number] Class: Normal  . Order #: [id number] Class: Historical Med  . Order #: [id number] Class: Normal  . Order #: [id number] Class: Normal  . Order #: [id number] Class: Normal  . Order #:  [id number] Class: Historical Med    Allergies Ace inhibitors; Ativan [lorazepam]; Calcium; Donepezil hydrochloride; Latex; Memantine; Oxycodone-aspirin; Penicillins; Pregabalin; Rofecoxib; and Sulfonamide derivatives  Family History  Problem Relation Age of Onset  . Hyperlipidemia Father   . Arthritis Father   . Transient ischemic attack Son   . Depression Son   . Arthritis Mother   . Arthritis      Grandparents    Social History Social History  Substance Use Topics  . Smoking status: Never Smoker  . Smokeless tobacco: Not on file  . Alcohol use No    Review of Systems Constitutional: Negative for fever. Cardiovascular: Negative for chest pain. Respiratory: Negative for shortness of breath. Gastrointestinal: Negative for abdominal pain Musculoskeletal: Negative for back pain. Neurological: Negative for headache 10-point ROS otherwise negative.  ____________________________________________   PHYSICAL EXAM:  VITAL SIGNS: ED Triage Vitals [09/14/15 0151]  Enc Vitals Group     BP (!) 145/67     Pulse Rate 62     Resp      Temp 97.5 F (36.4 C)     Temp Source Oral     SpO2 99 %     Weight 136 lb (61.7 kg)     Height 5' (1.524 m)     Head Circumference      Peak Flow      Pain Score      Pain Loc      Pain Edu?      Excl. in GC?     Constitutional: Alert. Well appearing and in no distress. Eyes: Normal exam ENT   Head: Normocephalic and atraumatic.   Mouth/Throat: Mucous membranes are moist. Cardiovascular: Normal rate, regular rhythm. No murmur Respiratory: Normal respiratory effort without tachypnea nor retractions. Breath sounds are clear  Gastrointestinal: Soft mild suprapubic tenderness palpation. No rebound or guarding. No distention. Musculoskeletal: Nontender with normal range of motion in all extremities.  Neurologic:  Normal speech and language. No gross focal neurologic deficits Skin:  Skin is warm, dry and intact.  Psychiatric: Mood and  affect are normal.  ____________________________________________    EKG  EKG reviewed and interpreted by myself shows sinus rhythm at 61 bpm, narrow QRS, normal axis, normal intervals, no concerning ST changes, occasional PVC.  ____________________________________________     INITIAL IMPRESSION / ASSESSMENT AND PLAN / ED COURSE  Pertinent labs & imaging results that were available during my care of the patient were reviewed by me and considered in my medical decision making (see chart for details).  The patient presents the emergency department for evaluation for increased somnolence. Patient brought in by her sons who care for her for concerns of increased fatigue and generalized weakness. Patient has no complaints at this time, very pleasant. Patient does have them suprapubic tenderness to palpation, otherwise a very normal exam. We will check labs including a urinalysis.  Urinalysis consistent with urinary tract infection. We'll dose IV Rocephin in the emergency department. We'll discharge on Keflex. I sent a urine culture. Patient will follow-up  with her primary care physician. Patient family are agreeable to this plan. Patient remains alert, and well-appearing.  ____________________________________________   FINAL CLINICAL IMPRESSION(S) / ED DIAGNOSES  Somnolence Generalized weakness    Minna Antis, MD 09/14/15 [phone]

## 2015-09-14 NOTE — ED Notes (Signed)
Reviewed d/c instructions, follow-up care, and prescriptions with pt's son (caregiver). Pt's son verbalized understanding

## 2015-09-14 NOTE — ED Notes (Signed)
MD Paduchowski at bedside  

## 2015-09-14 NOTE — ED Notes (Signed)
Performed in and out cath with [facility]

## 2015-09-14 NOTE — ED Notes (Signed)
Per pt's son (caregiver): Pt hx alzheimer's, hypertension and renal insufficiency. Beginning Saturday morning pt's patterns changed: pt slept 23 hours, pt refuse food/drink. Pt more lethargic Sunday, agitated, decreased appetite. Pt c/o of diffuse abdominal pain, weakness, gait changes.

## 2015-09-14 NOTE — ED Triage Notes (Signed)
Pt presents to ED with c/o worsening altered mental status for the past several days. Pt reportedly is unsteady on her feet and has a noticeable decrease in appetite. denies urinary symptoms or recent falls. Pt alert and talkative at this time. No distress noted.

## 2015-09-14 NOTE — Telephone Encounter (Signed)
Pt in hospital now being treated for UTI

## 2015-09-14 NOTE — Telephone Encounter (Signed)
PLEASE NOTE: All timestamps contained within this report are represented as [place] Standard Time. CONFIDENTIALTY NOTICE: This fax transmission is intended only for the addressee. It contains information that is legally privileged, confidential or otherwise protected from use or disclosure. If you are not the intended recipient, you are strictly prohibited from reviewing, disclosing, copying using or disseminating any of this information or taking any action in reliance on or regarding this information. If you have received this fax in error, please notify us immediately by telephone so that we can arrange for its return to [place]. Phone: [phone], Toll-Free: [phone], Fax: [phone] Page: 1 of 2 Call Id: [id number] [facility] Night - Client TELEPHONE ADVICE RECORD [facility] Medical Call Center Patient Name: Sara Lang Gender: Female DOB: [birth year] Age: 82 Y 11 M 19 D Return Phone Number: [phone] (Primary) Address: City/State/Zip: [name] [address] Client [facility] Night - Client Client Site [facility] - Night Physician [place], Idamae Schuller - MD Contact Type Call Who Is Calling Patient / Member / Family / Caregiver Call Type Triage / Clinical Caller Name [facility] Relationship To Patient Son Return Phone Number [phone] (Primary) Chief Complaint DIFFICULTY TO AWAKEN in anyone Reason for Call Symptomatic / Request for Health Information Initial Comment Caller states mother has been sleeping a lot and acting oddly for past 24 hrs, has slept all except 2.5 hrs of past 48, "is alert but unaware of person, place or time", doesn't know son's name, hx of [place]'s PreDisposition Did not know what to do Translation No Nurse Assessment Nurse: Solocinski, RN, Beth Date/Time (Eastern Time): 09/14/2015 12:01:25 AM Confirm and document reason for call. If symptomatic, describe symptoms. You must click the  next button to save text entered. ---Caller states mom has sleeping since Saturday til now all but about 2.5 hours. Caller states mom has increased confusion and abdominal pain about 5/10. Caller states urine is normal. Caller states blood sugar 212 about 15 minutes ago. Caller states mom ate once yesterday at 1030 pm. Has the patient traveled out of the country within the last 30 days? ---No Does the patient have any new or worsening symptoms? ---Yes Will a triage be completed? ---Yes Related visit to physician within the last 2 weeks? ---No Does the PT have any chronic conditions? (i.e. diabetes, asthma, etc.) ---Yes List chronic conditions. ---diabetes, iddm, renal insuffiency, htn Is this a behavioral health or substance abuse call? ---No Guidelines Guideline Title Affirmed Question Affirmed Notes Nurse Date/Time (Eastern Time) Confusion - Delirium [1] Difficult to awaken or acting confused (e.g., disoriented, slurred Solocinski, RN, Beth 09/14/2015 12:09:09 AM PLEASE NOTE: All timestamps contained within this report are represented as [place] Standard Time. CONFIDENTIALTY NOTICE: This fax transmission is intended only for the addressee. It contains information that is legally privileged, confidential or otherwise protected from use or disclosure. If you are not the intended recipient, you are strictly prohibited from reviewing, disclosing, copying using or disseminating any of this information or taking any action in reliance on or regarding this information. If you have received this fax in error, please notify us immediately by telephone so that we can arrange for its return to [place]. Phone: [phone], Toll-Free: [phone], Fax: [phone] Page: 2 of 2 Call Id: [id number] Guidelines Guideline Title Affirmed Question Affirmed Notes Nurse Date/Time Lamount Cohen Time) speech) AND [2] present now AND [3] new onset Disp. Time Lamount Cohen Time) Disposition Final User 09/14/2015  12:00:23 AM Send To RN Personal Larmer, Francee Nodal  09/14/2015 12:20:47 AM 911 Outcome Documentation Solocinski, RN, Beth Reason: Caller states his mom is a DNR and he may take her to ed tonight or in am. Call is to notify Dr. Milinda Antis of change in patient condition. 09/14/2015 12:19:15 AM Call EMS 911 Now Yes Solocinski, RN, Curator Understands: Yes Disagree/Comply: Disagree Disagree/Comply Reason: Disagree with instructions Care Advice Given Per Guideline CALL EMS 911 NOW: Immediate medical attention is needed. You need to hang up and call 911 (or an ambulance). (Triager Discretion: I'll call you back in a few minutes to be sure you were able to reach them.) CARE ADVICE given per Confusion- Delirium (Adult) guideline. Comments User: Heinz Knuckles, RN Date/Time Lamount Cohen Time): 09/14/2015 12:17:39 AM Caller states mom is a DNR and does not want to call 911. Referrals GO TO FACILITY REFUSED

## 2015-09-14 NOTE — Telephone Encounter (Signed)
Watch for signs of infection like uri or pneumonia  This could also be progression of dementia Keep me posted

## 2015-09-15 LAB — URINE CULTURE

## 2015-09-16 NOTE — Progress Notes (Signed)
Patient visited ED on 09/14/15 with diagnoses of somnolence and weakness. Patient was afebrile and not experiencing dysuria during admission. Patient was discharged on cephalexin 500 mg one capsule tid x 10 days. Urine culture report reveals >100,000 colonies lactobacillus. Dr. Derrill Kay agrees we can d/c cephalexin if patient is still asymptomatic. Called caregiver and patient is still asymptomatic and caregiver agreed to stop cephalexin. Answered caregivers questions and patient is doing well.  Horris Latino, PharmD Pharmacy Resident 09/16/2015 5:23 PM

## 2015-10-05 DIAGNOSIS — I5020 Unspecified systolic (congestive) heart failure: Secondary | ICD-10-CM | POA: Diagnosis not present

## 2015-10-16 ENCOUNTER — Telehealth: Payer: Self-pay | Admitting: *Deleted

## 2015-10-16 NOTE — Telephone Encounter (Signed)
TC to pt to schedule AWV lvm for pt to call back.

## 2015-11-05 DIAGNOSIS — I5020 Unspecified systolic (congestive) heart failure: Secondary | ICD-10-CM | POA: Diagnosis not present

## 2015-11-07 ENCOUNTER — Other Ambulatory Visit: Payer: Self-pay | Admitting: Family Medicine

## 2015-11-09 ENCOUNTER — Emergency Department
Admission: EM | Admit: 2015-11-09 | Discharge: 2015-11-09 | Disposition: A | Discharge status: Home / Self Care | Payer: Medicare Other | Attending: Emergency Medicine | Admitting: Emergency Medicine

## 2015-11-09 ENCOUNTER — Encounter: Payer: Self-pay | Admitting: *Deleted

## 2015-11-09 ENCOUNTER — Telehealth: Payer: Self-pay

## 2015-11-09 DIAGNOSIS — R197 Diarrhea, unspecified: Secondary | ICD-10-CM

## 2015-11-09 DIAGNOSIS — E860 Dehydration: Secondary | ICD-10-CM | POA: Diagnosis not present

## 2015-11-09 DIAGNOSIS — E039 Hypothyroidism, unspecified: Secondary | ICD-10-CM | POA: Diagnosis not present

## 2015-11-09 DIAGNOSIS — Z794 Long term (current) use of insulin: Secondary | ICD-10-CM | POA: Diagnosis not present

## 2015-11-09 DIAGNOSIS — Z79899 Other long term (current) drug therapy: Secondary | ICD-10-CM | POA: Insufficient documentation

## 2015-11-09 DIAGNOSIS — I110 Hypertensive heart disease with heart failure: Secondary | ICD-10-CM | POA: Diagnosis not present

## 2015-11-09 DIAGNOSIS — N19 Unspecified kidney failure: Secondary | ICD-10-CM | POA: Diagnosis not present

## 2015-11-09 DIAGNOSIS — G309 Alzheimer's disease, unspecified: Secondary | ICD-10-CM | POA: Diagnosis not present

## 2015-11-09 DIAGNOSIS — I509 Heart failure, unspecified: Secondary | ICD-10-CM | POA: Diagnosis not present

## 2015-11-09 DIAGNOSIS — R531 Weakness: Secondary | ICD-10-CM | POA: Diagnosis not present

## 2015-11-09 DIAGNOSIS — E119 Type 2 diabetes mellitus without complications: Secondary | ICD-10-CM | POA: Diagnosis not present

## 2015-11-09 LAB — COMPREHENSIVE METABOLIC PANEL
ALT: 12 U/L — ABNORMAL LOW (ref 14–54)
ANION GAP: 9 (ref 5–15)
AST: 17 U/L (ref 15–41)
Albumin: 4.1 g/dL (ref 3.5–5.0)
Alkaline Phosphatase: 86 U/L (ref 38–126)
BUN: 27 mg/dL — AB (ref 6–20)
CHLORIDE: 101 mmol/L (ref 101–111)
CO2: 29 mmol/L (ref 22–32)
Calcium: 9 mg/dL (ref 8.9–10.3)
Creatinine, Ser: 1.45 mg/dL — ABNORMAL HIGH (ref 0.44–1.00)
GFR calc Af Amer: 35 mL/min — ABNORMAL LOW (ref >60)
GFR calc non Af Amer: 30 mL/min — ABNORMAL LOW (ref >60)
GLUCOSE: 148 mg/dL — AB (ref 65–99)
POTASSIUM: 3.8 mmol/L (ref 3.5–5.1)
Sodium: 139 mmol/L (ref 135–145)
Total Bilirubin: 1.2 mg/dL (ref 0.3–1.2)
Total Protein: 6.8 g/dL (ref 6.5–8.1)

## 2015-11-09 LAB — URINALYSIS COMPLETE WITH MICROSCOPIC (ARMC ONLY)
Bacteria, UA: NONE SEEN
Bilirubin Urine: NEGATIVE
Glucose, UA: NEGATIVE mg/dL
Hgb urine dipstick: NEGATIVE
KETONES UR: NEGATIVE mg/dL
Leukocytes, UA: NEGATIVE
NITRITE: NEGATIVE
PROTEIN: 30 mg/dL — AB
RBC / HPF: NONE SEEN RBC/hpf (ref 0–5)
SPECIFIC GRAVITY, URINE: 1.012 (ref 1.005–1.030)
pH: 6 (ref 5.0–8.0)

## 2015-11-09 LAB — CBC
HEMATOCRIT: 42.6 % (ref 35.0–47.0)
HEMOGLOBIN: 14.7 g/dL (ref 12.0–16.0)
MCH: 32.6 pg (ref 26.0–34.0)
MCHC: 34.6 g/dL (ref 32.0–36.0)
MCV: 94.1 fL (ref 80.0–100.0)
Platelets: 206 10*3/uL (ref 150–440)
RBC: 4.52 MIL/uL (ref 3.80–5.20)
RDW: 13.6 % (ref 11.5–14.5)
WBC: 8.1 10*3/uL (ref 3.6–11.0)

## 2015-11-09 LAB — LIPASE, BLOOD: LIPASE: 32 U/L (ref 11–51)

## 2015-11-09 MED ORDER — DIPHENOXYLATE-ATROPINE 2.5-0.025 MG PO TABS: 1.0000 | ORAL_TABLET | Freq: Four times a day (QID) | ORAL | 1 refills | Status: AC | PRN

## 2015-11-09 MED ORDER — SODIUM CHLORIDE 0.9 % IV SOLN
Freq: Once | INTRAVENOUS | Status: AC
  Administered 2015-11-09: 18:00:00 via INTRAVENOUS

## 2015-11-09 NOTE — ED Provider Notes (Signed)
[facility] Emergency Department Provider Note    L5 caveat: Review of systems and history is limited by dementia.    Time seen: ----------------------------------------- 5:45 PM on 11/09/2015 -----------------------------------------    I have reviewed the triage vital signs and the nursing notes.   HISTORY  Chief Complaint Diarrhea    HPI Sara Lang is a 82 y.o. female who presents to ER for diarrhea for the last 4 days. Patient has dementia and has had changes in her appetite, sometimes she eats more and sometimes she eats less. Son who is a Engineer, civil (consulting) notes that she was orthostatic when she stood up today after having a large episode of diarrhea. She was feeling lightheaded, systolic blood pressure at that time was reported to be around 70. She denies other complaints.   Past Medical History:  Diagnosis Date  . Abnormal pancreatic function study    elevated enzymes  . Backache, unspecified   . Carpal tunnel syndrome   . Chronic pain syndrome   . Disorder of bone and cartilage, unspecified   . Edema   . Esophageal reflux   . Gout, unspecified   . History of transfusion of whole blood   . Hyperpotassemia   . Jaundice, unspecified, not of newborn   . Memory loss   . Mononeuritis of unspecified site   . Osteoarthrosis, unspecified whether generalized or localized, unspecified site   . Other B-complex deficiencies   . Other chronic pain   . Other malaise and fatigue   . Pain in limb   . Peptic ulcer, unspecified site, unspecified as acute or chronic, without mention of hemorrhage, perforation, or obstruction   . Pure hypercholesterolemia   . Renal failure, unspecified    secondary to Vioxx  . Type II or unspecified type diabetes mellitus without mention of complication, not stated as uncontrolled   . Unspecified adverse effect of unspecified drug, medicinal and biological substance   . Unspecified essential hypertension   . Unspecified  hypothyroidism   . Unspecified vitamin D deficiency     Patient Active Problem List   Diagnosis Date Noted  . Diarrhea 11/09/2015  . FTT (failure to thrive) in adult 07/22/2015  . Decreased appetite 03/06/2015  . Fatigue 03/06/2015  . Incontinence 03/06/2015  . Skin tear of lower leg without complication 08/07/2014  . Fall at home 08/04/2014  . CHF (congestive heart failure) (HCC) 07/04/2014  . Nocturnal hypoxia 07/04/2014  . Encounter for Medicare annual wellness exam 04/27/2014  . Hypokalemia 05/20/2013  . At risk for falling 12/06/2011  . HYPERKALEMIA 04/23/2010  . ADVERSE DRUG REACTION 03/27/2009  . GOUT 09/25/2008  . OTHER CHRONIC PAIN 08/14/2008  . B12 deficiency 07/02/2008  . Vitamin D deficiency 07/02/2008  . Dementia arising in the senium and presenium 07/02/2008  . FATIGUE 06/25/2008  . BACK PAIN, CHRONIC 08/04/2006  . FOOT PAIN, CHRONIC 08/04/2006  . Osteopenia 08/04/2006  . Hypothyroidism 08/02/2006  . Diabetes type 2, controlled (HCC) 08/02/2006  . HYPERCHOLESTEROLEMIA 08/02/2006  . SYNDROME, CHRONIC PAIN 08/02/2006  . CARPAL TUNNEL SYNDROME 08/02/2006  . NEUROPATHY 08/02/2006  . Essential hypertension 08/02/2006  . GERD 08/02/2006  . PEPTIC ULCER DISEASE 08/02/2006  . Renal failure 08/02/2006  . Osteoarthritis 08/02/2006  . JAUNDICE 08/02/2006  . PEDAL EDEMA 08/02/2006    Past Surgical History:  Procedure Laterality Date  . APPENDECTOMY  1928  . CATARACT EXTRACTION    . CHOLECYSTECTOMY  1956  . ESOPHAGOGASTRODUODENOSCOPY     stricture  .  THYROIDECTOMY     throat surgery-cold nodule  . TOE AMPUTATION     x2 Hammer toes  . TOE AMPUTATION    . toe nail procedure    . TONSILLECTOMY      Allergies Ace inhibitors; Ativan [lorazepam]; Calcium; Donepezil hydrochloride; Latex; Memantine; Oxycodone-aspirin; Penicillins; Pregabalin; Rofecoxib; and Sulfonamide derivatives  Social History Social History  Substance Use Topics  . Smoking status: Never  Smoker  . Smokeless tobacco: Not on file  . Alcohol use No    Review of Systems Positive for dizziness and diarrhea  ____________________________________________   PHYSICAL EXAM:  VITAL SIGNS: ED Triage Vitals  Enc Vitals Group     BP 11/09/15 1538 (!) 145/79     Pulse Rate 11/09/15 1538 61     Resp 11/09/15 1538 17     Temp 11/09/15 1538 98.3 F (36.8 C)     Temp Source 11/09/15 1538 Oral     SpO2 11/09/15 1538 99 %     Weight 11/09/15 1539 136 lb (61.7 kg)     Height 11/09/15 1539 5\' 5"  (1.651 m)     Head Circumference --      Peak Flow --      Pain Score --      Pain Loc --      Pain Edu? --      Excl. in GC? --     Constitutional: Alert but disoriented. Well appearing and in no distress. Eyes: Conjunctivae are normal. PERRL. Normal extraocular movements. ENT   Head: Normocephalic and atraumatic.   Nose: No congestion/rhinnorhea.   Mouth/Throat: Mucous membranes are moist.   Neck: No stridor. Cardiovascular: Normal rate, regular rhythm. No murmurs, rubs, or gallops. Respiratory: Normal respiratory effort without tachypnea nor retractions. Breath sounds are clear and equal bilaterally. No wheezes/rales/rhonchi. Gastrointestinal: Soft and nontender. Normal bowel sounds Musculoskeletal: Nontender with normal range of motion in all extremities. No lower extremity tenderness nor edema. Neurologic:  Normal speech and language. No gross focal neurologic deficits are appreciated.  Skin:  Skin is warm, dry and intact. No rash noted. Psychiatric: Mood and affect are normal. Speech and behavior are normal.  ____________________________________________  ED COURSE:  Pertinent labs & imaging results that were available during my care of the patient were reviewed by me and considered in my medical decision making (see chart for details). Clinical Course  Patient presents likely orthostatic from diarrhea and dehydration. She will receive IV fluids, we'll assess  with basic labs and send stool studies.  Procedures ____________________________________________   LABS (pertinent positives/negatives)  Labs Reviewed  COMPREHENSIVE METABOLIC PANEL - Abnormal; Notable for the following:       Result Value   Glucose, Bld 148 (*)    BUN 27 (*)    Creatinine, Ser 1.45 (*)    ALT 12 (*)    GFR calc non Af Amer 30 (*)    GFR calc Af Amer 35 (*)    All other components within normal limits  GASTROINTESTINAL PANEL BY PCR, STOOL (REPLACES STOOL CULTURE)  LIPASE, BLOOD  CBC  URINALYSIS COMPLETEWITH MICROSCOPIC ([facility] ONLY)   ____________________________________________  FINAL ASSESSMENT AND PLAN  Weakness, diarrhea  Plan: Patient with labs and imaging as dictated above. Patient's labs are reassuring and actually review her creatinine has improved compared to prior. She was given IV saline bolus, she could not give [place] a stool sample. Family are advised to return for worsening or worrisome symptoms.   Emily Filbert, MD   Note:  This dictation was prepared with Dragon dictation. Any transcriptional errors that result from this process are unintentional    Emily FilbertJonathan E Marilu Rylander, MD 11/09/15 [phone]

## 2015-11-09 NOTE — Telephone Encounter (Signed)
Please refill for a year  

## 2015-11-09 NOTE — ED Notes (Signed)
Discharge instructions reviewed with patient and son . Questions fielded by this RN. Patient and son verbalizes understanding of instructions. Patient discharged home in stable condition per Mayford KnifeWilliams MD . No acute distress noted at time of discharge.

## 2015-11-09 NOTE — Telephone Encounter (Signed)
Please let him know that unfortunately - since we are not an urgent care office we cannot take walk in patients - I was in the middle of patient care when he showed up Also - I /they cannot run any lab work without an order from me - and I cannot order something without knowing what is going on -nor can I do it while I am with other patients  In the future please call and let us know what is going on or make an appointment   So -please talk to him more about her symptoms- any abdominal pain/ blood in stool or other symptoms  If she has s/s of dehydration- she may need IVF (would need to go to ED) What was the last thing she ate before she got sick?   Any sick contacts? Any recent trips to hospital or nursing home where she could have come in contact with c diff?  (or has she been on abx lately)   If her stool is not liquid - we cannot send for c diff test (they only test pure liquid stool)  However- if necessary- we may be able to send for food poisoning tests etc -- depending on her symptoms   Let me know how he answers the questions and I will go from there

## 2015-11-09 NOTE — Telephone Encounter (Signed)
Pt's son is taking mother to ER now because she did have signs of dehydration. Pt hasn't been around anyone since and hasn't been out of her house so no exposure to nursing home or hospital. Son request that we start the process of testing for C-diff, [facility]

## 2015-11-09 NOTE — ED Notes (Signed)
MD Williams at bedside.  

## 2015-11-09 NOTE — ED Triage Notes (Addendum)
Son states pt has had diarrhea for 4 days, pt has dementia, states decreased appetite, states dizziness when she stood today after having a large episode of diarrhea

## 2015-11-09 NOTE — ED Notes (Signed)
Per pt's son. Pt has hx of alzheimers. Pt c/o diarrhea X 3 days (mixed liquid and solid). Pt c/o dizziness, anorexia.

## 2015-11-09 NOTE — Telephone Encounter (Signed)
Sara Lang brought stool specimen to lab; pt has had uncontrollable diarrhea for 3 days; no fever, abdominal pain and no change in diet. Last seen 05/15/15. Sara Lang wants stool tested to see if pt has infection or bacteria in stool. Stool specimen in lab and Terri in lab is aware. Dr Milinda Antisower will review later today. Sara Lang request cb today. Sara Lang also left mychart message in his chart since pt is not set up for mychart.

## 2015-11-09 NOTE — Telephone Encounter (Signed)
I ordered it  I will let them know if the lab has any problems processing it  I'm glad they went to the ED if she looks dehydrated

## 2015-11-09 NOTE — Telephone Encounter (Signed)
Last filled on 10/22/14 #30 patches with 11 additional refills, please advise

## 2015-11-10 LAB — C. DIFFICILE GDH AND TOXIN A/B
C. DIFF TOXIN A/B: NOT DETECTED
C. DIFFICILE GDH: NOT DETECTED

## 2015-11-11 ENCOUNTER — Observation Stay
Admission: EM | Admit: 2015-11-11 | Discharge: 2015-11-12 | Disposition: A | Discharge status: Home / Self Care | Payer: Medicare Other | Attending: Internal Medicine | Admitting: Internal Medicine

## 2015-11-11 ENCOUNTER — Observation Stay
Admit: 2015-11-11 | Discharge: 2015-11-11 | Disposition: A | Discharge status: Home / Self Care | Payer: Medicare Other | Attending: Internal Medicine | Admitting: Internal Medicine

## 2015-11-11 ENCOUNTER — Emergency Department: Payer: Medicare Other

## 2015-11-11 ENCOUNTER — Encounter: Payer: Self-pay | Admitting: Emergency Medicine

## 2015-11-11 DIAGNOSIS — E039 Hypothyroidism, unspecified: Secondary | ICD-10-CM | POA: Diagnosis not present

## 2015-11-11 DIAGNOSIS — Z9049 Acquired absence of other specified parts of digestive tract: Secondary | ICD-10-CM | POA: Insufficient documentation

## 2015-11-11 DIAGNOSIS — I081 Rheumatic disorders of both mitral and tricuspid valves: Secondary | ICD-10-CM | POA: Insufficient documentation

## 2015-11-11 DIAGNOSIS — I110 Hypertensive heart disease with heart failure: Secondary | ICD-10-CM | POA: Insufficient documentation

## 2015-11-11 DIAGNOSIS — R55 Syncope and collapse: Secondary | ICD-10-CM | POA: Diagnosis not present

## 2015-11-11 DIAGNOSIS — E119 Type 2 diabetes mellitus without complications: Secondary | ICD-10-CM | POA: Diagnosis not present

## 2015-11-11 DIAGNOSIS — G589 Mononeuropathy, unspecified: Secondary | ICD-10-CM | POA: Diagnosis not present

## 2015-11-11 DIAGNOSIS — K449 Diaphragmatic hernia without obstruction or gangrene: Secondary | ICD-10-CM | POA: Diagnosis not present

## 2015-11-11 DIAGNOSIS — Z8249 Family history of ischemic heart disease and other diseases of the circulatory system: Secondary | ICD-10-CM | POA: Insufficient documentation

## 2015-11-11 DIAGNOSIS — R262 Difficulty in walking, not elsewhere classified: Secondary | ICD-10-CM

## 2015-11-11 DIAGNOSIS — R0902 Hypoxemia: Secondary | ICD-10-CM | POA: Diagnosis not present

## 2015-11-11 DIAGNOSIS — E876 Hypokalemia: Secondary | ICD-10-CM | POA: Insufficient documentation

## 2015-11-11 DIAGNOSIS — I509 Heart failure, unspecified: Secondary | ICD-10-CM | POA: Insufficient documentation

## 2015-11-11 DIAGNOSIS — M549 Dorsalgia, unspecified: Secondary | ICD-10-CM | POA: Diagnosis not present

## 2015-11-11 DIAGNOSIS — F0390 Unspecified dementia without behavioral disturbance: Secondary | ICD-10-CM | POA: Diagnosis not present

## 2015-11-11 DIAGNOSIS — E875 Hyperkalemia: Secondary | ICD-10-CM | POA: Insufficient documentation

## 2015-11-11 DIAGNOSIS — Z882 Allergy status to sulfonamides status: Secondary | ICD-10-CM | POA: Insufficient documentation

## 2015-11-11 DIAGNOSIS — R569 Unspecified convulsions: Secondary | ICD-10-CM | POA: Diagnosis not present

## 2015-11-11 DIAGNOSIS — M109 Gout, unspecified: Secondary | ICD-10-CM | POA: Diagnosis not present

## 2015-11-11 DIAGNOSIS — I10 Essential (primary) hypertension: Secondary | ICD-10-CM | POA: Diagnosis not present

## 2015-11-11 DIAGNOSIS — Z9841 Cataract extraction status, right eye: Secondary | ICD-10-CM | POA: Insufficient documentation

## 2015-11-11 DIAGNOSIS — Z888 Allergy status to other drugs, medicaments and biological substances status: Secondary | ICD-10-CM | POA: Insufficient documentation

## 2015-11-11 DIAGNOSIS — M1990 Unspecified osteoarthritis, unspecified site: Secondary | ICD-10-CM | POA: Diagnosis not present

## 2015-11-11 DIAGNOSIS — Z818 Family history of other mental and behavioral disorders: Secondary | ICD-10-CM | POA: Insufficient documentation

## 2015-11-11 DIAGNOSIS — G894 Chronic pain syndrome: Secondary | ICD-10-CM | POA: Insufficient documentation

## 2015-11-11 DIAGNOSIS — E860 Dehydration: Secondary | ICD-10-CM | POA: Diagnosis not present

## 2015-11-11 DIAGNOSIS — Z8261 Family history of arthritis: Secondary | ICD-10-CM | POA: Insufficient documentation

## 2015-11-11 DIAGNOSIS — Z880 Allergy status to penicillin: Secondary | ICD-10-CM | POA: Insufficient documentation

## 2015-11-11 DIAGNOSIS — Z8711 Personal history of peptic ulcer disease: Secondary | ICD-10-CM | POA: Insufficient documentation

## 2015-11-11 DIAGNOSIS — Z9842 Cataract extraction status, left eye: Secondary | ICD-10-CM | POA: Insufficient documentation

## 2015-11-11 DIAGNOSIS — Z89429 Acquired absence of other toe(s), unspecified side: Secondary | ICD-10-CM | POA: Insufficient documentation

## 2015-11-11 DIAGNOSIS — R42 Dizziness and giddiness: Secondary | ICD-10-CM | POA: Diagnosis not present

## 2015-11-11 DIAGNOSIS — Z794 Long term (current) use of insulin: Secondary | ICD-10-CM | POA: Diagnosis not present

## 2015-11-11 DIAGNOSIS — E7800 Pure hypercholesterolemia, unspecified: Secondary | ICD-10-CM | POA: Insufficient documentation

## 2015-11-11 DIAGNOSIS — K219 Gastro-esophageal reflux disease without esophagitis: Secondary | ICD-10-CM | POA: Insufficient documentation

## 2015-11-11 DIAGNOSIS — Z885 Allergy status to narcotic agent status: Secondary | ICD-10-CM | POA: Insufficient documentation

## 2015-11-11 DIAGNOSIS — R627 Adult failure to thrive: Secondary | ICD-10-CM | POA: Insufficient documentation

## 2015-11-11 DIAGNOSIS — J439 Emphysema, unspecified: Secondary | ICD-10-CM | POA: Diagnosis not present

## 2015-11-11 DIAGNOSIS — R197 Diarrhea, unspecified: Secondary | ICD-10-CM

## 2015-11-11 DIAGNOSIS — E559 Vitamin D deficiency, unspecified: Secondary | ICD-10-CM | POA: Insufficient documentation

## 2015-11-11 DIAGNOSIS — Z91040 Latex allergy status: Secondary | ICD-10-CM | POA: Insufficient documentation

## 2015-11-11 DIAGNOSIS — G5600 Carpal tunnel syndrome, unspecified upper limb: Secondary | ICD-10-CM | POA: Insufficient documentation

## 2015-11-11 DIAGNOSIS — Z79899 Other long term (current) drug therapy: Secondary | ICD-10-CM | POA: Insufficient documentation

## 2015-11-11 DIAGNOSIS — R413 Other amnesia: Secondary | ICD-10-CM | POA: Diagnosis not present

## 2015-11-11 DIAGNOSIS — R404 Transient alteration of awareness: Secondary | ICD-10-CM | POA: Diagnosis not present

## 2015-11-11 LAB — TROPONIN I
Troponin I: <0.03 ng/mL (ref <0.03)
Troponin I: <0.03 ng/mL (ref <0.03)

## 2015-11-11 LAB — COMPREHENSIVE METABOLIC PANEL
ALK PHOS: 84 U/L (ref 38–126)
ALT: 12 U/L — AB (ref 14–54)
AST: 19 U/L (ref 15–41)
Albumin: 3.6 g/dL (ref 3.5–5.0)
Anion gap: 6 (ref 5–15)
BUN: 19 mg/dL (ref 6–20)
CALCIUM: 8.7 mg/dL — AB (ref 8.9–10.3)
CHLORIDE: 107 mmol/L (ref 101–111)
CO2: 28 mmol/L (ref 22–32)
Creatinine, Ser: 1.22 mg/dL — ABNORMAL HIGH (ref 0.44–1.00)
GFR calc Af Amer: 43 mL/min — ABNORMAL LOW (ref >60)
GFR, EST NON AFRICAN AMERICAN: 37 mL/min — AB (ref >60)
Glucose, Bld: 189 mg/dL — ABNORMAL HIGH (ref 65–99)
Potassium: 3.9 mmol/L (ref 3.5–5.1)
Sodium: 141 mmol/L (ref 135–145)
Total Bilirubin: 1.1 mg/dL (ref 0.3–1.2)
Total Protein: 6.2 g/dL — ABNORMAL LOW (ref 6.5–8.1)

## 2015-11-11 LAB — CBC WITH DIFFERENTIAL/PLATELET
BASOS ABS: 0.1 10*3/uL (ref 0–0.1)
Basophils Relative: 1 %
EOS PCT: 1 %
Eosinophils Absolute: 0.1 10*3/uL (ref 0–0.7)
HCT: 41.8 % (ref 35.0–47.0)
HEMOGLOBIN: 14.3 g/dL (ref 12.0–16.0)
LYMPHS ABS: 2.5 10*3/uL (ref 1.0–3.6)
LYMPHS PCT: 33 %
MCH: 32.5 pg (ref 26.0–34.0)
MCHC: 34.3 g/dL (ref 32.0–36.0)
MCV: 94.8 fL (ref 80.0–100.0)
Monocytes Absolute: 0.5 10*3/uL (ref 0.2–0.9)
Monocytes Relative: 6 %
NEUTROS ABS: 4.6 10*3/uL (ref 1.4–6.5)
NEUTROS PCT: 59 %
PLATELETS: 210 10*3/uL (ref 150–440)
RBC: 4.41 MIL/uL (ref 3.80–5.20)
RDW: 13.4 % (ref 11.5–14.5)
WBC: 7.7 10*3/uL (ref 3.6–11.0)

## 2015-11-11 LAB — GLUCOSE, CAPILLARY
Glucose-Capillary: 156 mg/dL — ABNORMAL HIGH (ref 65–99)
Glucose-Capillary: 173 mg/dL — ABNORMAL HIGH (ref 65–99)

## 2015-11-11 LAB — URINALYSIS COMPLETE WITH MICROSCOPIC (ARMC ONLY)
Bilirubin Urine: NEGATIVE
Glucose, UA: 50 mg/dL — AB
HGB URINE DIPSTICK: NEGATIVE
KETONES UR: NEGATIVE mg/dL
LEUKOCYTES UA: NEGATIVE
NITRITE: NEGATIVE
PH: 5 (ref 5.0–8.0)
PROTEIN: 100 mg/dL — AB
SPECIFIC GRAVITY, URINE: 1.014 (ref 1.005–1.030)

## 2015-11-11 LAB — LIPASE, BLOOD: Lipase: 22 U/L (ref 11–51)

## 2015-11-11 LAB — MAGNESIUM: Magnesium: 1.4 mg/dL — ABNORMAL LOW (ref 1.7–2.4)

## 2015-11-11 MED ORDER — AMLODIPINE BESYLATE 5 MG PO TABS
5.0000 mg | ORAL_TABLET | Freq: Two times a day (BID) | ORAL | Status: DC
  Administered 2015-11-12: 10:00:00 5 mg via ORAL
  Filled 2015-11-11: qty 1

## 2015-11-11 MED ORDER — RIVASTIGMINE 4.6 MG/24HR TD PT24
4.6000 mg | MEDICATED_PATCH | Freq: Every day | TRANSDERMAL | Status: DC
  Administered 2015-11-11 – 2015-11-12 (×2): 4.6 mg via TRANSDERMAL
  Filled 2015-11-11 (×2): qty 1

## 2015-11-11 MED ORDER — LIDOCAINE 5 % EX PTCH
1.0000 | MEDICATED_PATCH | CUTANEOUS | Status: DC
  Filled 2015-11-11 (×2): qty 1

## 2015-11-11 MED ORDER — ACETAMINOPHEN 325 MG PO TABS
650.0000 mg | ORAL_TABLET | Freq: Four times a day (QID) | ORAL | Status: DC | PRN
Start: 2015-11-11 — End: 2015-11-12

## 2015-11-11 MED ORDER — SODIUM CHLORIDE 0.9% FLUSH
3.0000 mL | Freq: Two times a day (BID) | INTRAVENOUS | Status: DC
  Administered 2015-11-11 – 2015-11-12 (×2): 3 mL via INTRAVENOUS

## 2015-11-11 MED ORDER — LEVOTHYROXINE SODIUM 88 MCG PO TABS
88.0000 ug | ORAL_TABLET | Freq: Every day | ORAL | Status: DC
  Administered 2015-11-12: 10:00:00 88 ug via ORAL
  Filled 2015-11-11: qty 1

## 2015-11-11 MED ORDER — VITAMIN D (ERGOCALCIFEROL) 1.25 MG (50000 UNIT) PO CAPS
50000.0000 [IU] | ORAL_CAPSULE | ORAL | Status: DC
  Filled 2015-11-11: qty 1

## 2015-11-11 MED ORDER — ONDANSETRON HCL 4 MG PO TABS: 4.0000 mg | ORAL_TABLET | Freq: Four times a day (QID) | ORAL | Status: DC | PRN

## 2015-11-11 MED ORDER — INSULIN ASPART 100 UNIT/ML ~~LOC~~ SOLN
0.0000 [IU] | Freq: Three times a day (TID) | SUBCUTANEOUS | Status: DC
  Administered 2015-11-12: 2 [IU] via SUBCUTANEOUS
  Filled 2015-11-11: qty 2
  Filled 2015-11-11: qty 1

## 2015-11-11 MED ORDER — ACETAMINOPHEN 650 MG RE SUPP: 650.0000 mg | Freq: Four times a day (QID) | RECTAL | Status: DC | PRN

## 2015-11-11 MED ORDER — DIPHENOXYLATE-ATROPINE 2.5-0.025 MG PO TABS: 1.0000 | ORAL_TABLET | Freq: Four times a day (QID) | ORAL | Status: DC | PRN

## 2015-11-11 MED ORDER — PANTOPRAZOLE SODIUM 40 MG PO TBEC
40.0000 mg | DELAYED_RELEASE_TABLET | Freq: Every day | ORAL | Status: DC
  Administered 2015-11-12: 40 mg via ORAL
  Filled 2015-11-11: qty 1

## 2015-11-11 MED ORDER — SODIUM CHLORIDE 0.9 % IV BOLUS (SEPSIS)
500.0000 mL | Freq: Once | INTRAVENOUS | Status: AC
  Administered 2015-11-11: 500 mL via INTRAVENOUS

## 2015-11-11 MED ORDER — ENOXAPARIN SODIUM 30 MG/0.3ML ~~LOC~~ SOLN
30.0000 mg | SUBCUTANEOUS | Status: DC
  Administered 2015-11-11: 30 mg via SUBCUTANEOUS
  Filled 2015-11-11: qty 0.3

## 2015-11-11 MED ORDER — INFLUENZA VAC SPLIT QUAD 0.5 ML IM SUSY
0.5000 mL | PREFILLED_SYRINGE | INTRAMUSCULAR | Status: AC
  Administered 2015-11-12: 0.5 mL via INTRAMUSCULAR
  Filled 2015-11-11: qty 0.5

## 2015-11-11 MED ORDER — INSULIN ASPART 100 UNIT/ML ~~LOC~~ SOLN: 0.0000 [IU] | Freq: Every day | SUBCUTANEOUS | Status: DC

## 2015-11-11 MED ORDER — ONDANSETRON HCL 4 MG/2ML IJ SOLN: 4.0000 mg | Freq: Four times a day (QID) | INTRAMUSCULAR | Status: DC | PRN

## 2015-11-11 MED ORDER — AMLODIPINE BESYLATE 5 MG PO TABS
5.0000 mg | ORAL_TABLET | ORAL | Status: AC
Start: 2015-11-11 — End: 2015-11-11
  Administered 2015-11-11: 5 mg via ORAL
  Filled 2015-11-11: qty 1

## 2015-11-11 MED ORDER — HYDRALAZINE HCL 20 MG/ML IJ SOLN: 10.0000 mg | Freq: Four times a day (QID) | INTRAMUSCULAR | Status: DC | PRN

## 2015-11-11 MED ORDER — DEXTROSE 5 % IV SOLN
1.0000 g | Freq: Every day | INTRAVENOUS | Status: DC
  Administered 2015-11-11: 1 g via INTRAVENOUS
  Filled 2015-11-11: qty 10

## 2015-11-11 NOTE — ED Provider Notes (Signed)
[facility] Emergency Department Provider Note  ____________________________________________   I have reviewed the triage vital signs and the nursing notes.   HISTORY  Chief Complaint Loss of Consciousness    HPI Sara Lang is a 82 y.o. female  Resents today with diarrhea has had diarrhea for the better part of the week it sounds like. She was seen here and looks quite well.  Patient was well-appearing at that time with reassuring labs and was appropriately sent home. However, her diarrhea has continued and she is, as instructed by prior physician, coming back. In fact, the inciting event for her return was that she was sitting on the toilet having a bowel movement and she passed out for a brief period. This was observed by her son. She did not fall or hit her head. She is now back to her baseline. There is no seizure observed. Patient herself says she feels a little bit shaky and has no other complaints. By report, patient has had a negative C. difficile study as an outpatient.      Past Medical History:  Diagnosis Date  . Abnormal pancreatic function study    elevated enzymes  . Backache, unspecified   . Carpal tunnel syndrome   . Chronic pain syndrome   . Disorder of bone and cartilage, unspecified   . Edema   . Esophageal reflux   . Gout, unspecified   . History of transfusion of whole blood   . Hyperpotassemia   . Jaundice, unspecified, not of newborn   . Memory loss   . Mononeuritis of unspecified site   . Osteoarthrosis, unspecified whether generalized or localized, unspecified site   . Other B-complex deficiencies   . Other chronic pain   . Other malaise and fatigue   . Pain in limb   . Peptic ulcer, unspecified site, unspecified as acute or chronic, without mention of hemorrhage, perforation, or obstruction   . Pure hypercholesterolemia   . Renal failure, unspecified    secondary to Vioxx  . Type II or unspecified type diabetes  mellitus without mention of complication, not stated as uncontrolled   . Unspecified adverse effect of unspecified drug, medicinal and biological substance   . Unspecified essential hypertension   . Unspecified hypothyroidism   . Unspecified vitamin D deficiency     Patient Active Problem List   Diagnosis Date Noted  . Diarrhea 11/09/2015  . FTT (failure to thrive) in adult 07/22/2015  . Decreased appetite 03/06/2015  . Fatigue 03/06/2015  . Incontinence 03/06/2015  . Skin tear of lower leg without complication 08/07/2014  . Fall at home 08/04/2014  . CHF (congestive heart failure) (HCC) 07/04/2014  . Nocturnal hypoxia 07/04/2014  . Encounter for Medicare annual wellness exam 04/27/2014  . Hypokalemia 05/20/2013  . At risk for falling 12/06/2011  . HYPERKALEMIA 04/23/2010  . ADVERSE DRUG REACTION 03/27/2009  . GOUT 09/25/2008  . OTHER CHRONIC PAIN 08/14/2008  . B12 deficiency 07/02/2008  . Vitamin D deficiency 07/02/2008  . Dementia arising in the senium and presenium 07/02/2008  . FATIGUE 06/25/2008  . BACK PAIN, CHRONIC 08/04/2006  . FOOT PAIN, CHRONIC 08/04/2006  . Osteopenia 08/04/2006  . Hypothyroidism 08/02/2006  . Diabetes type 2, controlled (HCC) 08/02/2006  . HYPERCHOLESTEROLEMIA 08/02/2006  . SYNDROME, CHRONIC PAIN 08/02/2006  . CARPAL TUNNEL SYNDROME 08/02/2006  . NEUROPATHY 08/02/2006  . Essential hypertension 08/02/2006  . GERD 08/02/2006  . PEPTIC ULCER DISEASE 08/02/2006  . Renal failure 08/02/2006  . Osteoarthritis  08/02/2006  . JAUNDICE 08/02/2006  . PEDAL EDEMA 08/02/2006    Past Surgical History:  Procedure Laterality Date  . APPENDECTOMY  1928  . CATARACT EXTRACTION    . CHOLECYSTECTOMY  1956  . ESOPHAGOGASTRODUODENOSCOPY     stricture  . THYROIDECTOMY     throat surgery-cold nodule  . TOE AMPUTATION     x2 Hammer toes  . TOE AMPUTATION    . toe nail procedure    . TONSILLECTOMY      Prior to Admission medications   Medication Sig  Start Date End Date Taking? Authorizing Provider  amLODipine (NORVASC) 5 MG tablet TAKE ONE TABLET BY MOUTH EVERY 12 HOURS 04/02/15  Yes Judy Pimple, MD  diphenoxylate-atropine (LOMOTIL) 2.5-0.025 MG tablet Take 1 tablet by mouth 4 (four) times daily as needed for diarrhea or loose stools. 11/09/15 11/08/16 Yes Emily Filbert, MD  furosemide (LASIX) 20 MG tablet Take 20 mg by mouth daily.    Yes Historical Provider, MD  furosemide (LASIX) 40 MG tablet TAKE 1 TABLET BY MOUTH DAILY 08/06/15  Yes Judy Pimple, MD  insulin lispro (HUMALOG KWIKPEN) 100 UNIT/ML KiwkPen Inject under skin up to 8 units 3x a day 15 min before a meal Patient taking differently: Inject 8 Units into the skin 3 (three) times daily. Inject under skin up to 8 units 3x a day 15 min before a meal 05/22/13  Yes Carlus Pavlov, MD  levothyroxine (SYNTHROID, LEVOTHROID) 88 MCG tablet TAKE 1 TABLET BY MOUTH DAILY 08/05/15  Yes Marne A Tower, MD  lidocaine (LIDODERM) 5 % Place 1 patch onto the skin daily. Remove & Discard patch within 12 hours or as directed by MD    Yes Historical Provider, MD  omeprazole (PRILOSEC) 20 MG capsule TAKE ONE CAPSULE BY MOUTH DAILY 04/02/15  Yes Judy Pimple, MD  potassium chloride (K-DUR,KLOR-CON) 10 MEQ tablet TAKE 1 TABLET BY MOUTH DAILY WITH A MEAL 06/08/15  Yes Judy Pimple, MD  rivastigmine (EXELON) 4.6 mg/24hr APPLY ONE PATCH TO A HAIRLESS AREA ON THE SKIN ONCE DAILY AS DIRECTED. REMOVE OLD PATCH BEFORE APPLYING NEW ONE. 11/09/15  Yes Judy Pimple, MD  Vitamin D, Ergocalciferol, (DRISDOL) 50000 UNITS CAPS capsule Take 50,000 Units by mouth every 7 (seven) days.   Yes Historical Provider, MD  cephALEXin (KEFLEX) 500 MG capsule Take 1 capsule (500 mg total) by mouth 3 (three) times daily. Patient not taking: Reported on 11/11/2015 09/14/15   Minna Antis, MD  food thickener (THICK IT) POWD Take 1 Container by mouth as needed.     Historical Provider, MD  Insulin Syringe-Needle U-100 (B-D INS SYR  ULTRAFINE .5CC/30G) 30G X 1/2" 0.5 ML MISC Use as directed 02/15/13   Judy Pimple, MD    Allergies Ace inhibitors; Ativan [lorazepam]; Calcium; Donepezil hydrochloride; Latex; Memantine; Oxycodone-aspirin; Penicillins; Pregabalin; Rofecoxib; and Sulfonamide derivatives  Family History  Problem Relation Age of Onset  . Hyperlipidemia Father   . Arthritis Father   . Transient ischemic attack Son   . Depression Son   . Arthritis Mother   . Arthritis      Grandparents    Social History Social History  Substance Use Topics  . Smoking status: Never Smoker  . Smokeless tobacco: Not on file  . Alcohol use No    Review of Systems Constitutional: No fever/chills Eyes: No visual changes. ENT: No sore throat. No stiff neck no neck pain Cardiovascular: Denies chest pain. Respiratory: Denies shortness of breath. Gastrointestinal:  no vomiting.  Positive diarrhea.  No constipation. Genitourinary: Negative for dysuria. Musculoskeletal: Negative lower extremity swelling Skin: Negative for rash. Neurological: Negative for severe headaches, focal weakness or numbness. 10-point ROS otherwise negative.  ____________________________________________   PHYSICAL EXAM:  VITAL SIGNS: ED Triage Vitals  Enc Vitals Group     BP 11/11/15 1349 (!) 182/83     Pulse Rate 11/11/15 1349 63     Resp 11/11/15 1349 17     Temp 11/11/15 1349 98 F (36.7 C)     Temp Source 11/11/15 1349 Oral     SpO2 11/11/15 1349 98 %     Weight 11/11/15 1350 118 lb 1.6 oz (53.6 kg)     Height 11/11/15 1350 5\' 4"  (1.626 m)     Head Circumference --      Peak Flow --      Pain Score 11/11/15 1351 0     Pain Loc --      Pain Edu? --      Excl. in GC? --     Constitutional: Alert and oriented To name and place unsure of the year. Well appearing and in no acute distress. Eyes: Conjunctivae are normal. PERRL. EOMI. Head: Atraumatic. Nose: No congestion/rhinnorhea. Mouth/Throat: Mucous membranes are dry.   Oropharynx non-erythematous. Neck: No stridor.   Nontender with no meningismus Cardiovascular: Normal rate, regular rhythm. Grossly normal heart sounds.  Good peripheral circulation. Respiratory: Normal respiratory effort.  No retractions. Lungs CTAB. Abdominal: Soft and nontender. No distention. No guarding no rebound Back:  There is no focal tenderness or step off.  there is no midline tenderness there are no lesions noted. there is no CVA tenderness Musculoskeletal: No lower extremity tenderness, no upper extremity tenderness. No joint effusions, no DVT signs strong distal pulses no edema Neurologic:  Normal speech and language. No gross focal neurologic deficits are appreciated.  Skin: No rash noted  ____________________________________________   LABS (all labs ordered are listed, but only abnormal results are displayed)  Labs Reviewed  COMPREHENSIVE METABOLIC PANEL - Abnormal; Notable for the following:       Result Value   Glucose, Bld 189 (*)    Creatinine, Ser 1.22 (*)    Calcium 8.7 (*)    Total Protein 6.2 (*)    ALT 12 (*)    GFR calc non Af Amer 37 (*)    GFR calc Af Amer 43 (*)    All other components within normal limits  MAGNESIUM - Abnormal; Notable for the following:    Magnesium 1.4 (*)    All other components within normal limits  URINALYSIS COMPLETEWITH MICROSCOPIC ([facility] ONLY) - Abnormal; Notable for the following:    Color, Urine YELLOW (*)    APPearance HAZY (*)    Glucose, UA 50 (*)    Protein, ur 100 (*)    Bacteria, UA MANY (*)    Squamous Epithelial / LPF 0-5 (*)    All other components within normal limits  GASTROINTESTINAL PANEL BY PCR, STOOL (REPLACES STOOL CULTURE)  CBC WITH DIFFERENTIAL/PLATELET  LIPASE, BLOOD  TROPONIN I   ____________________________________________  EKG  I personally interpreted any EKGs ordered by me or triage Normal sinus rhythm at 64 bpm no acute ST elevation or acute ST depression normal axis unremarkable  EKG ____________________________________________  RADIOLOGY  I reviewed any imaging ordered by me or triage that were performed during my shift and, if possible, patient and/or family made aware of any abnormal findings. ____________________________________________   PROCEDURES  Procedure(s) performed: None  Procedures  Critical Care performed: None  ____________________________________________   INITIAL IMPRESSION / ASSESSMENT AND PLAN / ED COURSE  Pertinent labs & imaging results that were available during my care of the patient were reviewed by me and considered in my medical decision making (see chart for details).  Patient with a witnessed syncopal event in the context of an ongoing diarrheal illness with no evidence of hematemesis or melena. Hemoglobin and blood work are quite reassuring signs are reassuring. We have given her ginger IV hydration as she has been off her Lasix for a few days. We have talked to the hospitals and they will admit.  Clinical Course   ____________________________________________   FINAL CLINICAL IMPRESSION(S) / ED DIAGNOSES  Final diagnoses:  None      This chart was dictated using voice recognition software.  Despite best efforts to proofread,  errors can occur which can change meaning.      Jeanmarie Plant, MD 11/11/15 [phone]

## 2015-11-11 NOTE — ED Notes (Signed)
Patient transported to radiology

## 2015-11-11 NOTE — ED Notes (Signed)
Admitting MD at bedside.

## 2015-11-11 NOTE — ED Notes (Signed)
Patient transported to CT 

## 2015-11-11 NOTE — ED Notes (Signed)
In and out cath completed by this RN assisted by Lelon MastSamantha, NT.

## 2015-11-11 NOTE — ED Triage Notes (Signed)
Per [facility], patient comes from home due to syncope. Patient was recently seen here on Monday due to dehydration. She was given 1L NS bolus and sent home. Patient has been going well but today she woke up and had an diarrheal incontinence episode in bed. Patients son got her OOB and walked her to the bathroom. Patient states she felt dizzy while walking. When she got to the commode she had another episode of diarrhea and then had a syncopal event. Patients son assisted her to the ground. Patient did not hit her head.

## 2015-11-11 NOTE — Consult Note (Signed)
Pharmacy Antibiotic Note  Sara Lang is a 82 y.o. female admitted on 11/11/2015 with UTI.  Pharmacy has been consulted for ceftriaxone dosing.  Plan: ceftriaxone 1g q 24 hours  Height: 5\' 4"  (162.6 cm) Weight: 118 lb 1.6 oz (53.6 kg) IBW/kg (Calculated) : 54.7  Temp (24hrs), Avg:98 F (36.7 C), Min:98 F (36.7 C), Max:98 F (36.7 C)   Recent Labs Lab 11/09/15 1545 11/11/15 1350  WBC 8.1 7.7  CREATININE 1.45* 1.22*    Estimated Creatinine Clearance: 24.4 mL/min (by C-G formula based on SCr of 1.22 mg/dL (H)).    Allergies  Allergen Reactions  . Ace Inhibitors Other (See Comments)    Reaction: ace inhibitors cause inc K-  . Ativan [Lorazepam] Other (See Comments)  . Calcium Nausea Only  . Donepezil Hydrochloride Other (See Comments)    Reaction: MS change  . Latex Itching and Other (See Comments)    Reaction: Red skin  . Memantine Other (See Comments)    Reaction: MS change  . Oxycodone-Aspirin Other (See Comments)    Reaction: Unknown  . Penicillins Other (See Comments)    Reaction: Unknown  . Pregabalin Nausea And Vomiting  . Rofecoxib Other (See Comments)    Reaction: Renal failure  . Sulfonamide Derivatives Other (See Comments)    Reaction: Unknown    Antimicrobials this admission: ceftriaxone 9/20 >>    Dose adjustments this admission:   Microbiology results:  9/20 UCx:     Thank you for allowing pharmacy to be a part of this patient's care.  Olene FlossMelissa D Jerald Hennington 11/11/2015 6:27 PM

## 2015-11-11 NOTE — ED Notes (Addendum)
Per verbal order from Dr. Nemiah CommanderKalisetti do not collect troponin at 1610. Collect q 6 hours.

## 2015-11-11 NOTE — ED Notes (Signed)
CBG 156, 3 attempts of drawing pt's blood unsuccessful. Lab technician called to obtain sample.

## 2015-11-11 NOTE — H&P (Signed)
[facility] [facility]   PATIENT NAME: Sara Lang    MR#:  [id number]  DATE OF BIRTH:  March 13, 1920  DATE OF ADMISSION:  11/11/2015  PRIMARY CARE PHYSICIAN: Roxy Manns, MD   REQUESTING/REFERRING PHYSICIAN: Dr. Ileana Roup  CHIEF COMPLAINT:   Chief Complaint  Patient presents with  . Loss of Consciousness    HISTORY OF PRESENT ILLNESS:  Sara Lang  is a 82 y.o. female with a known history of  Hypertension, insulin-dependent diabetes mellitus, arthritis, dementia presents from home secondary to syncopal episode.  Patient cannot give any history due to her dementia. She is pleasantly confused and oriented to self. She is taken care of by her Sons at home. Both sons are present at bedside and provides most of the history. According to them, her symptoms started with diarrhea about 2-3 days ago, she presented to the emergency room. She did not have any abdominal pain or nausea or vomiting. C. difficile was negative at the time so she was given some fluids and discharged home. Over the last couple of days her stools have become more softer but her frequency was still high. She had a soft stool bowel movement today after which she felt like she still needed to go to the bathroom. She was sitting on the commode and then she passed out. Patient's son states that she was not straining on the commode. He laid her down on the ground, she did not hit her head. She fingerstick at the time was greater than 200. No fevers or chills. No chest pain or nausea or vomiting. Her troponin has remained negative. CT of the head is pending. There was questionable handshaking and the sons are concerned about seizure.  PAST MEDICAL HISTORY:   Past Medical History:  Diagnosis Date  . Abnormal pancreatic function study    elevated enzymes  . Backache, unspecified   . Carpal tunnel syndrome   . Chronic pain syndrome   . Disorder of bone and cartilage, unspecified   . Edema   .  Esophageal reflux   . Gout, unspecified   . History of transfusion of whole blood   . Hyperpotassemia   . Jaundice, unspecified, not of newborn   . Memory loss   . Mononeuritis of unspecified site   . Osteoarthrosis, unspecified whether generalized or localized, unspecified site   . Other B-complex deficiencies   . Other chronic pain   . Other malaise and fatigue   . Pain in limb   . Peptic ulcer, unspecified site, unspecified as acute or chronic, without mention of hemorrhage, perforation, or obstruction   . Pure hypercholesterolemia   . Renal failure, unspecified    secondary to Vioxx  . Type II or unspecified type diabetes mellitus without mention of complication, not stated as uncontrolled   . Unspecified adverse effect of unspecified drug, medicinal and biological substance   . Unspecified essential hypertension   . Unspecified hypothyroidism   . Unspecified vitamin D deficiency     PAST SURGICAL HISTORY:   Past Surgical History:  Procedure Laterality Date  . APPENDECTOMY  1928  . CATARACT EXTRACTION    . CHOLECYSTECTOMY  1956  . ESOPHAGOGASTRODUODENOSCOPY     stricture  . THYROIDECTOMY     throat surgery-cold nodule  . TOE AMPUTATION     x2 Hammer toes  . TOE AMPUTATION    . toe nail procedure    . TONSILLECTOMY      SOCIAL HISTORY:  Social History  Substance Use Topics  . Smoking status: Never Smoker  . Smokeless tobacco: Not on file  . Alcohol use No    FAMILY HISTORY:   Family History  Problem Relation Age of Onset  . Hyperlipidemia Father   . Arthritis Father   . Transient ischemic attack Son   . Depression Son   . Arthritis Mother   . Arthritis      Grandparents    DRUG ALLERGIES:   Allergies  Allergen Reactions  . Ace Inhibitors Other (See Comments)    Reaction: ace inhibitors cause inc K-  . Ativan [Lorazepam] Other (See Comments)  . Calcium Nausea Only  . Donepezil Hydrochloride Other (See Comments)    Reaction: MS change  .  Latex Itching and Other (See Comments)    Reaction: Red skin  . Memantine Other (See Comments)    Reaction: MS change  . Oxycodone-Aspirin Other (See Comments)    Reaction: Unknown  . Penicillins Other (See Comments)    Reaction: Unknown  . Pregabalin Nausea And Vomiting  . Rofecoxib Other (See Comments)    Reaction: Renal failure  . Sulfonamide Derivatives Other (See Comments)    Reaction: Unknown    REVIEW OF SYSTEMS:   Review of Systems  Unable to perform ROS: Dementia    MEDICATIONS AT HOME:   Prior to Admission medications   Medication Sig Start Date End Date Taking? Authorizing Provider  amLODipine (NORVASC) 5 MG tablet TAKE ONE TABLET BY MOUTH EVERY 12 HOURS 04/02/15  Yes Judy PimpleMarne A Tower, MD  diphenoxylate-atropine (LOMOTIL) 2.5-0.025 MG tablet Take 1 tablet by mouth 4 (four) times daily as needed for diarrhea or loose stools. 11/09/15 11/08/16 Yes Emily FilbertJonathan E Williams, MD  furosemide (LASIX) 20 MG tablet Take 20 mg by mouth daily.    Yes Historical Provider, MD  furosemide (LASIX) 40 MG tablet TAKE 1 TABLET BY MOUTH DAILY 08/06/15  Yes Judy PimpleMarne A Tower, MD  insulin lispro (HUMALOG KWIKPEN) 100 UNIT/ML KiwkPen Inject under skin up to 8 units 3x a day 15 min before a meal Patient taking differently: Inject 8 Units into the skin 3 (three) times daily. Inject under skin up to 8 units 3x a day 15 min before a meal 05/22/13  Yes Carlus Pavlovristina Gherghe, MD  levothyroxine (SYNTHROID, LEVOTHROID) 88 MCG tablet TAKE 1 TABLET BY MOUTH DAILY 08/05/15  Yes Marne A Tower, MD  lidocaine (LIDODERM) 5 % Place 1 patch onto the skin daily. Remove & Discard patch within 12 hours or as directed by MD    Yes Historical Provider, MD  omeprazole (PRILOSEC) 20 MG capsule TAKE ONE CAPSULE BY MOUTH DAILY 04/02/15  Yes Judy PimpleMarne A Tower, MD  potassium chloride (K-DUR,KLOR-CON) 10 MEQ tablet TAKE 1 TABLET BY MOUTH DAILY WITH A MEAL 06/08/15  Yes Judy PimpleMarne A Tower, MD  rivastigmine (EXELON) 4.6 mg/24hr APPLY ONE PATCH TO A HAIRLESS  AREA ON THE SKIN ONCE DAILY AS DIRECTED. REMOVE OLD PATCH BEFORE APPLYING NEW ONE. 11/09/15  Yes Judy PimpleMarne A Tower, MD  Vitamin D, Ergocalciferol, (DRISDOL) 50000 UNITS CAPS capsule Take 50,000 Units by mouth every 7 (seven) days.   Yes Historical Provider, MD  cephALEXin (KEFLEX) 500 MG capsule Take 1 capsule (500 mg total) by mouth 3 (three) times daily. Patient not taking: Reported on 11/11/2015 09/14/15   Minna AntisKevin Paduchowski, MD  food thickener (THICK IT) POWD Take 1 Container by mouth as needed.     Historical Provider, MD  Insulin Syringe-Needle U-100 (B-D INS SYR ULTRAFINE .  5CC/30G) 30G X 1/2" 0.5 ML MISC Use as directed 02/15/13   Judy Pimple, MD      VITAL SIGNS:  Blood pressure (!) 174/82, pulse 66, temperature 98 F (36.7 C), temperature source Oral, resp. rate 14, height 5\' 4"  (1.626 m), weight 53.6 kg (118 lb 1.6 oz), SpO2 99 %.  PHYSICAL EXAMINATION:   Physical Exam  GENERAL:  82 y.o.-year-old elderly patient lying in the bed with no acute distress.  EYES: Pupils equal, round, reactive to light and accommodation. No scleral icterus. Extraocular muscles intact.  HEENT: Head atraumatic, normocephalic. Oropharynx and nasopharynx clear.  NECK:  Supple, no jugular venous distention. No thyroid enlargement, no tenderness.  LUNGS: Normal breath sounds bilaterally, no wheezing, rales,rhonchi or crepitation. No use of accessory muscles of respiration.  CARDIOVASCULAR: S1, S2 normal. No rubs, or gallops. 2/6 systolic murmur present ABDOMEN: Soft, nontender, nondistended. Bowel sounds present. No organomegaly or mass.  EXTREMITIES: No pedal edema, cyanosis, or clubbing. Right knee pain- chronic from arthritis NEUROLOGIC: Cranial nerves II through XII are intact. Muscle strength 5/5 in all extremities. Sensation intact. Gait not checked. Global weakness noted. Due to knee arthritis, lower extremities movement limited. PSYCHIATRIC: The patient is alert and oriented to self.  SKIN: No obvious  rash, lesion, or ulcer.   LABORATORY PANEL:   CBC  Recent Labs Lab 11/11/15 1350  WBC 7.7  HGB 14.3  HCT 41.8  PLT 210   ------------------------------------------------------------------------------------------------------------------  Chemistries   Recent Labs Lab 11/11/15 1350  NA 141  K 3.9  CL 107  CO2 28  GLUCOSE 189*  BUN 19  CREATININE 1.22*  CALCIUM 8.7*  MG 1.4*  AST 19  ALT 12*  ALKPHOS 84  BILITOT 1.1   ------------------------------------------------------------------------------------------------------------------  Cardiac Enzymes  Recent Labs Lab 11/11/15 1350  TROPONINI <0.03   ------------------------------------------------------------------------------------------------------------------  RADIOLOGY:  Dg Chest 2 View  Result Date: 11/11/2015 CLINICAL DATA:  Syncopal episode today, dehydration, renal failure. EXAM: CHEST  2 VIEW COMPARISON:  Chest x-ray of 01/23/2015 FINDINGS: No active infiltrate or effusion is seen. Mediastinal and hilar contours are unremarkable. The lungs are hyperaerated suggesting an element of emphysema. There is a retrocardiac opacity consistent with hiatal hernia. The bones are osteopenic. IMPRESSION: 1. Hyperaeration may indicate emphysema.  No active lung disease. 2. Hiatal hernia. Electronically Signed   By: Dwyane Dee M.D.   On: 11/11/2015 15:52    EKG:   Orders placed or performed during the hospital encounter of 11/11/15  . EKG 12-Lead  . EKG 12-Lead    IMPRESSION AND PLAN:   Sara Lang  is a 82 y.o. female with a known history of  Hypertension, insulin-dependent diabetes mellitus, arthritis, dementia presents from home secondary to syncopal episode.  #1 Syncope- could be vasovagal, however family thinks if any cardiac event or seizures need to be considered - troponins ordered, check CT head, ECHO and carotid dopplers - Monitor on tele, Physical Therapy - orthostatics ordered - does not appear to  be dehydrated, so hold off on fluids, hold lasix though - UA indicative of infection- though patient not symptomatic- urine cultures ordered, started rocephin for now  #2 HTN- continue norvasc bid, IV hydralazine prn as BP elevated  #3 DM- on SSI at home- continue that, check hba1c  #4 CHF- check ECHO, hold lasix, CXR with no fluid overload, hold IV fluids - monitor closely  #5 Dementia- continue home meds, at baseline  #6 DVT prophylaxis- lovenox  Physical Therapy   All the  records are reviewed and case discussed with ED provider. Management plans discussed with the patient, family and they are in agreement.  CODE STATUS: Full Code  TOTAL TIME TAKING CARE OF THIS PATIENT: 50 minutes.    Dago Jungwirth M.D on 11/11/2015 at 4:16 PM  Between 7am to 6pm - Pager - [phone]  After 6pm go to [email or web address] - Social research officer, government  [facility]  Office  [phone]  CC: Primary care physician; Roxy Manns, MD

## 2015-11-11 NOTE — ED Notes (Signed)
Patients brief checked. Patient dry. Warm blankets given. No further needs at this time. Will continue to monitor.

## 2015-11-12 ENCOUNTER — Observation Stay: Payer: Medicare Other

## 2015-11-12 DIAGNOSIS — E119 Type 2 diabetes mellitus without complications: Secondary | ICD-10-CM | POA: Diagnosis not present

## 2015-11-12 DIAGNOSIS — R569 Unspecified convulsions: Secondary | ICD-10-CM | POA: Diagnosis not present

## 2015-11-12 DIAGNOSIS — I6523 Occlusion and stenosis of bilateral carotid arteries: Secondary | ICD-10-CM | POA: Diagnosis not present

## 2015-11-12 DIAGNOSIS — I10 Essential (primary) hypertension: Secondary | ICD-10-CM | POA: Diagnosis not present

## 2015-11-12 DIAGNOSIS — R55 Syncope and collapse: Secondary | ICD-10-CM

## 2015-11-12 LAB — CBC
HEMATOCRIT: 37.7 % (ref 35.0–47.0)
HEMOGLOBIN: 13 g/dL (ref 12.0–16.0)
MCH: 32.7 pg (ref 26.0–34.0)
MCHC: 34.4 g/dL (ref 32.0–36.0)
MCV: 95.2 fL (ref 80.0–100.0)
Platelets: 192 10*3/uL (ref 150–440)
RBC: 3.96 MIL/uL (ref 3.80–5.20)
RDW: 13.5 % (ref 11.5–14.5)
WBC: 7.6 10*3/uL (ref 3.6–11.0)

## 2015-11-12 LAB — BASIC METABOLIC PANEL
ANION GAP: 4 — AB (ref 5–15)
BUN: 17 mg/dL (ref 6–20)
CHLORIDE: 108 mmol/L (ref 101–111)
CO2: 27 mmol/L (ref 22–32)
Calcium: 8.3 mg/dL — ABNORMAL LOW (ref 8.9–10.3)
Creatinine, Ser: 1.23 mg/dL — ABNORMAL HIGH (ref 0.44–1.00)
GFR calc non Af Amer: 37 mL/min — ABNORMAL LOW (ref >60)
GFR, EST AFRICAN AMERICAN: 42 mL/min — AB (ref >60)
GLUCOSE: 259 mg/dL — AB (ref 65–99)
POTASSIUM: 3.5 mmol/L (ref 3.5–5.1)
Sodium: 139 mmol/L (ref 135–145)

## 2015-11-12 LAB — GLUCOSE, CAPILLARY
GLUCOSE-CAPILLARY: 146 mg/dL — AB (ref 65–99)
GLUCOSE-CAPILLARY: 174 mg/dL — AB (ref 65–99)

## 2015-11-12 LAB — ECHOCARDIOGRAM COMPLETE
HEIGHTINCHES: 64 in
WEIGHTICAEL: 1889.6 [oz_av]

## 2015-11-12 LAB — TSH: TSH: 6.268 u[IU]/mL — ABNORMAL HIGH (ref 0.350–4.500)

## 2015-11-12 LAB — TROPONIN I: Troponin I: <0.03 ng/mL (ref <0.03)

## 2015-11-12 MED ORDER — MAGNESIUM SULFATE 2 GM/50ML IV SOLN
2.0000 g | Freq: Once | INTRAVENOUS | Status: AC
  Administered 2015-11-12: 2 g via INTRAVENOUS
  Filled 2015-11-12: qty 50

## 2015-11-12 MED ORDER — AMLODIPINE BESYLATE 5 MG PO TABS: 5.0000 mg | ORAL_TABLET | Freq: Two times a day (BID) | ORAL | 2 refills | Status: AC

## 2015-11-12 MED ORDER — CEPHALEXIN 500 MG PO CAPS
500.0000 mg | ORAL_CAPSULE | Freq: Two times a day (BID) | ORAL | Status: DC
  Administered 2015-11-12: 10:00:00 500 mg via ORAL
  Filled 2015-11-12: qty 1

## 2015-11-12 MED ORDER — FUROSEMIDE 20 MG PO TABS: 20.0000 mg | ORAL_TABLET | ORAL | 1 refills | Status: AC | PRN

## 2015-11-12 MED ORDER — POTASSIUM CHLORIDE CRYS ER 10 MEQ PO TBCR: 10.0000 meq | EXTENDED_RELEASE_TABLET | ORAL | 1 refills | Status: AC | PRN

## 2015-11-12 MED ORDER — CEPHALEXIN 500 MG PO CAPS: 500.0000 mg | ORAL_CAPSULE | Freq: Two times a day (BID) | ORAL | 0 refills | Status: DC

## 2015-11-12 NOTE — Evaluation (Signed)
Physical Therapy Evaluation Patient Details Name: TIAWANNA LUCHSINGER MRN: [id number] DOB: 1920-05-11 Today's Date: 11/12/2015   History of Present Illness  Pt admitted for syncope. Pt with history of HTN, DM, and dementia. Per pt chart, history of Cdiff x 2-3 days and could possibly be dehydrated.   Clinical Impression  Pt is a pleasant 82 year old female who was admitted for syncope. Orthostatics negative at this time. Pt performs bed mobility with min assist and transfers/ambulation with cga and RW. Pt on room air for all mobility with sats at 97%. Pt demonstrates deficits with endurance/strength/mobility. Would benefit from skilled PT to address above deficits and promote optimal return to PLOF. Recommend transition to HHPT upon discharge from acute hospitalization.       Follow Up Recommendations Home health PT;Supervision/Assistance - 24 hour    Equipment Recommendations  None recommended by PT    Recommendations for Other Services       Precautions / Restrictions Precautions Precautions: Fall Restrictions Weight Bearing Restrictions: No      Mobility  Bed Mobility Overal bed mobility: Needs Assistance Bed Mobility: Supine to Sit     Supine to sit: Min assist     General bed mobility comments: assist for bed mobility and able to grab railing to pull to sit at EOB. Min assist required for trunk support and to scoot out towards EOB.  Transfers Overall transfer level: Needs assistance Equipment used: Rolling walker (2 wheeled) Transfers: Sit to/from Stand Sit to Stand: Min guard         General transfer comment: Pt performed transfer with RW and safe technique. Pt able to perform upright posture.  Ambulation/Gait Ambulation/Gait assistance: Min guard Ambulation Distance (Feet): 80 Feet Assistive device: Rolling walker (2 wheeled) Gait Pattern/deviations: Step-through pattern     General Gait Details: Pt ambulates using reciprocal gait pattern. During the entire  time, she sings "I'm so tired, dontcha know". Pt with slow speed, however no LOB and good endurance noted  Stairs            Wheelchair Mobility    Modified Rankin (Stroke Patients Only)       Balance Overall balance assessment: History of Falls;Needs assistance Sitting-balance support: Feet supported Sitting balance-Leahy Scale: Good     Standing balance support: Bilateral upper extremity supported Standing balance-Leahy Scale: Fair                               Pertinent Vitals/Pain Pain Assessment: No/denies pain    Home Living Family/patient expects to be discharged to:: Private residence Living Arrangements: Children Available Help at Discharge: Family;Available 24 hours/day Type of Home: House Home Access: Stairs to enter Entrance Stairs-Rails: Can reach both Entrance Stairs-Number of Steps: 4 Home Layout: One level Home Equipment: Walker - 2 wheels      Prior Function Level of Independence: Independent with assistive device(s)         Comments: uses RW for all mobility     Hand Dominance        Extremity/Trunk Assessment   Upper Extremity Assessment: Generalized weakness (grossly 4/5 evidenced by functional movement)           Lower Extremity Assessment: Generalized weakness (grossly 4/5 evidenced by functional movement)         Communication   Communication: No difficulties  Cognition Arousal/Alertness: Awake/alert Behavior During Therapy: WFL for tasks assessed/performed Overall Cognitive Status: Within Functional Limits for tasks assessed  General Comments      Exercises     Assessment/Plan    PT Assessment Patient needs continued PT services  PT Problem List Decreased strength;Decreased activity tolerance;Decreased mobility          PT Treatment Interventions Gait training;DME instruction;Functional mobility training    PT Goals (Current goals can be found in the Care Plan  section)  Acute Rehab PT Goals Patient Stated Goal: to go home PT Goal Formulation: With patient Time For Goal Achievement: 11/26/15 Potential to Achieve Goals: Good    Frequency Min 2X/week   Barriers to discharge        Co-evaluation               End of Session Equipment Utilized During Treatment: Gait belt Activity Tolerance: Patient tolerated treatment well Patient left: in chair;with chair alarm set;with call bell/phone within reach Nurse Communication: Mobility status    Functional Assessment Tool Used: clinical judgement Functional Limitation: Mobility: Walking and moving around Mobility: Walking and Moving Around Current Status [phone](G8978): At least 1 percent but less than 20 percent impaired, limited or restricted Mobility: Walking and Moving Around Goal Status [phone](G8979): 0 percent impaired, limited or restricted    Time: 4401-02721006-1024 PT Time Calculation (min) (ACUTE ONLY): 18 min   Charges:   PT Evaluation $PT Eval Low Complexity: 1 Procedure     PT G Codes:   PT G-Codes **NOT FOR INPATIENT CLASS** Functional Assessment Tool Used: clinical judgement Functional Limitation: Mobility: Walking and moving around Mobility: Walking and Moving Around Current Status ([phone]): At least 1 percent but less than 20 percent impaired, limited or restricted Mobility: Walking and Moving Around Goal Status [phone]([id number]): 0 percent impaired, limited or restricted    Christana Angelica 11/12/2015, 1:09 PM  Elizabeth PalauStephanie Dixie Jafri, PT, DPT [phone]

## 2015-11-12 NOTE — Care Management (Signed)
Admitted to [facility] with the diagnosis of syncope. Lives with sons Dominenic [phone]) and Al [phone]). Last seen Dr. Milinda Antisower 6 months ago. Home Heath through [facility] in the past. No skilled facility. Home oxygen in the home provided by [facility], uses as needed. Standard walker,  and wheelchair in the home. Last fall was 11/10/15. Good appetite. Takes care of all basic activities of daily living herself. Sons help if needed.  Prescriptions are filled by [facility] in [place]. Sons will transport. Gwenette GreetBrenda s Liani Caris RN MSN CCM Care Management [phone]

## 2015-11-12 NOTE — Care Management Obs Status (Signed)
MEDICARE OBSERVATION STATUS NOTIFICATION   Patient Details  Name: Sara LighterMary A Dalgleish MRN: [id number] Date of Birth: 04/26/1920   Medicare Observation Status Notification Given:  Yes    Gwenette GreetBrenda S Darivs Lunden, RN 11/12/2015, 2:04 PM

## 2015-11-12 NOTE — Plan of Care (Signed)
Problem: Education: Goal: Knowledge of [facility] Education information/materials will improve Outcome: Progressing Pt likes to be called Sara Lang  Past Medical History:  Diagnosis Date  . Abnormal pancreatic function study    elevated enzymes  . Backache, unspecified   . Carpal tunnel syndrome   . Chronic pain syndrome   . Disorder of bone and cartilage, unspecified   . Edema   . Esophageal reflux   . Gout, unspecified   . History of transfusion of whole blood   . Hyperpotassemia   . Jaundice, unspecified, not of newborn   . Memory loss   . Mononeuritis of unspecified site   . Osteoarthrosis, unspecified whether generalized or localized, unspecified site   . Other B-complex deficiencies   . Other chronic pain   . Other malaise and fatigue   . Pain in limb   . Peptic ulcer, unspecified site, unspecified as acute or chronic, without mention of hemorrhage, perforation, or obstruction   . Pure hypercholesterolemia   . Renal failure, unspecified    secondary to Vioxx  . Type II or unspecified type diabetes mellitus without mention of complication, not stated as uncontrolled   . Unspecified adverse effect of unspecified drug, medicinal and biological substance   . Unspecified essential hypertension   . Unspecified hypothyroidism   . Unspecified vitamin D deficiency    Pt is well controlled with home medications

## 2015-11-12 NOTE — Discharge Instructions (Signed)
YOUR PCP WHEN you establish in the area

## 2015-11-12 NOTE — Discharge Summary (Signed)
[facility] - [place] [facility]   PATIENT NAME: Sara Lang    MR#:  [id number]  DATE OF BIRTH:  [birth year]  DATE OF ADMISSION:  11/11/2015 ADMITTING PHYSICIAN: Enid Baas, MD  DATE OF DISCHARGE: 11/12/15  PRIMARY CARE PHYSICIAN: No primary care provider on file.    ADMISSION DIAGNOSIS:  Syncope and collapse [R55] Dehydration [E86.0] Syncope [R55] Diarrhea, unspecified type [R19.7]  DISCHARGE DIAGNOSIS:  Syncope appears vasovagal Diarrhea-improving H/o Dementia SECONDARY DIAGNOSIS:   Past Medical History:  Diagnosis Date  . Abnormal pancreatic function study    elevated enzymes  . Backache, unspecified   . Carpal tunnel syndrome   . Chronic pain syndrome   . Disorder of bone and cartilage, unspecified   . Edema   . Esophageal reflux   . Gout, unspecified   . History of transfusion of whole blood   . Hyperpotassemia   . Jaundice, unspecified, not of newborn   . Memory loss   . Mononeuritis of unspecified site   . Osteoarthrosis, unspecified whether generalized or localized, unspecified site   . Other B-complex deficiencies   . Other chronic pain   . Other malaise and fatigue   . Pain in limb   . Peptic ulcer, unspecified site, unspecified as acute or chronic, without mention of hemorrhage, perforation, or obstruction   . Pure hypercholesterolemia   . Renal failure, unspecified    secondary to Vioxx  . Type II or unspecified type diabetes mellitus without mention of complication, not stated as uncontrolled   . Unspecified adverse effect of unspecified drug, medicinal and biological substance   . Unspecified essential hypertension   . Unspecified hypothyroidism   . Unspecified vitamin D deficiency     HOSPITAL COURSE:  Sara Lang  is a 82 y.o. female with a known history of  Hypertension, insulin-dependent diabetes mellitus, arthritis, dementia presents from home secondary to syncopal episode.  #1 Syncope- could be  vasovagal, however family thinks it is seizures  -no seizures noted -Family requested Neurology consult. EEG normal - troponins negative, negative CT head, ECHO normal and carotid dopplers neg for stenosis - Physical Therapy rec HHPT but son able to help mom at home and they decline it - does not appear to be dehydrated, so hold off on fluids, hold lasix and use only prn - UA  Shows bactreri, no pus and neg nitirte and LE-I will hold off abxs  #2 HTN- continue norvasc bid, IV hydralazine prn as BP elevated  #3 DM- on SSI at home  #4 CHF CXR with no fluid overload, hold IV fluids - monitor closely  #5 Dementia- continue home meds, at baseline  #6 DVT prophylaxis- lovenox  D/w pt's 2 sons at length  D/c home  CONSULTS OBTAINED:  Treatment Team:  Kym Groom, MD Thana Farr, MD  DRUG ALLERGIES:   Allergies  Allergen Reactions  . Ace Inhibitors Other (See Comments)    Reaction: ace inhibitors cause inc K-  . Ativan [Lorazepam] Other (See Comments)  . Calcium Nausea Only  . Donepezil Hydrochloride Other (See Comments)    Reaction: MS change  . Latex Itching and Other (See Comments)    Reaction: Red skin  . Memantine Other (See Comments)    Reaction: MS change  . Oxycodone-Aspirin Other (See Comments)    Reaction: Unknown  . Penicillins Other (See Comments)    Reaction: Unknown  . Pregabalin Nausea And Vomiting  . Rofecoxib Other (See Comments)    Reaction: Renal  failure  . Sulfonamide Derivatives Other (See Comments)    Reaction: Unknown    DISCHARGE MEDICATIONS:   Current Discharge Medication List    CONTINUE these medications which have CHANGED   Details  amLODipine (NORVASC) 5 MG tablet Take 1 tablet (5 mg total) by mouth every 12 (twelve) hours. Qty: 60 tablet, Refills: 2    furosemide (LASIX) 20 MG tablet Take 1 tablet (20 mg total) by mouth as needed. If pt has leg edema and sob Qty: 30 tablet, Refills: 1    potassium chloride  (K-DUR,KLOR-CON) 10 MEQ tablet Take 1 tablet (10 mEq total) by mouth as needed. When you use lasix Qty: 90 tablet, Refills: 1      CONTINUE these medications which have NOT CHANGED   Details  diphenoxylate-atropine (LOMOTIL) 2.5-0.025 MG tablet Take 1 tablet by mouth 4 (four) times daily as needed for diarrhea or loose stools. Qty: 30 tablet, Refills: 1    insulin lispro (HUMALOG KWIKPEN) 100 UNIT/ML KiwkPen Inject under skin up to 8 units 3x a day 15 min before a meal Qty: 15 mL, Refills: 11    levothyroxine (SYNTHROID, LEVOTHROID) 88 MCG tablet TAKE 1 TABLET BY MOUTH DAILY Qty: 30 tablet, Refills: 5    omeprazole (PRILOSEC) 20 MG capsule TAKE ONE CAPSULE BY MOUTH DAILY Qty: 90 capsule, Refills: 2    rivastigmine (EXELON) 4.6 mg/24hr APPLY ONE PATCH TO A HAIRLESS AREA ON THE SKIN ONCE DAILY AS DIRECTED. REMOVE OLD PATCH BEFORE APPLYING NEW ONE. Qty: 30 patch, Refills: 11    food thickener (THICK IT) POWD Take 1 Container by mouth as needed.     Insulin Syringe-Needle U-100 (B-D INS SYR ULTRAFINE .5CC/30G) 30G X 1/2" 0.5 ML MISC Use as directed Qty: 100 each, Refills: 0    lidocaine (LIDODERM) 5 % Place 1 patch onto the skin daily. Remove & Discard patch within 12 hours or as directed by MD     Vitamin D, Ergocalciferol, (DRISDOL) 50000 UNITS CAPS capsule Take 50,000 Units by mouth every 7 (seven) days.      STOP taking these medications     cephALEXin (KEFLEX) 500 MG capsule         If you experience worsening of your admission symptoms, develop shortness of breath, life threatening emergency, suicidal or homicidal thoughts you must seek medical attention immediately by calling 911 or calling your MD immediately  if symptoms less severe.  You Must read complete instructions/literature along with all the possible adverse reactions/side effects for all the Medicines you take and that have been prescribed to you. Take any new Medicines after you have completely understood and  accept all the possible adverse reactions/side effects.   Please note  You were cared for by a hospitalist during your hospital stay. If you have any questions about your discharge medications or the care you received while you were in the hospital after you are discharged, you can call the unit and asked to speak with the hospitalist on call if the hospitalist that took care of you is not available. Once you are discharged, your primary care physician will handle any further medical issues. Please note that NO REFILLS for any discharge medications will be authorized once you are discharged, as it is imperative that you return to your primary care physician (or establish a relationship with a primary care physician if you do not have one) for your aftercare needs so that they can reassess your need for medications and monitor your lab values. Today  SUBJECTIVE   No complaiints. No issues per RN  VITAL SIGNS:  Blood pressure 124/74, pulse (!) 57, temperature 97.7 F (36.5 C), temperature source Oral, resp. rate 20, height 5\' 4"  (1.626 m), weight 53.6 kg (118 lb 1.6 oz), SpO2 98 %.  I/O:   Intake/Output Summary (Last 24 hours) at 11/12/15 1620 Last data filed at 11/12/15 0900  Gross per 24 hour  Intake              170 ml  Output                2 ml  Net              168 ml    PHYSICAL EXAMINATION:  GENERAL:  82 y.o.-year-old patient lying in the bed with no acute distress.  EYES: Pupils equal, round, reactive to light and accommodation. No scleral icterus. Extraocular muscles intact.  HEENT: Head atraumatic, normocephalic. Oropharynx and nasopharynx clear.  NECK:  Supple, no jugular venous distention. No thyroid enlargement, no tenderness.  LUNGS: Normal breath sounds bilaterally, no wheezing, rales,rhonchi or crepitation. No use of accessory muscles of respiration.  CARDIOVASCULAR: S1, S2 normal. No murmurs, rubs, or gallops.  ABDOMEN: Soft, non-tender, non-distended. Bowel sounds  present. No organomegaly or mass.  EXTREMITIES: No pedal edema, cyanosis, or clubbing.  NEUROLOGIC: Cranial nerves II through XII are intact. Muscle strength 5/5 in all extremities. Sensation intact. Gait not checked. Overall grossly intact PSYCHIATRIC:  patient is alert and oriented x 3.  SKIN: No obvious rash, lesion, or ulcer.   DATA REVIEW:   CBC   Recent Labs Lab 11/12/15 0136  WBC 7.6  HGB 13.0  HCT 37.7  PLT 192    Chemistries   Recent Labs Lab 11/11/15 1350 11/12/15 0136  NA 141 139  K 3.9 3.5  CL 107 108  CO2 28 27  GLUCOSE 189* 259*  BUN 19 17  CREATININE 1.22* 1.23*  CALCIUM 8.7* 8.3*  MG 1.4*  --   AST 19  --   ALT 12*  --   ALKPHOS 84  --   BILITOT 1.1  --     Microbiology Results   No results found for this or any previous visit (from the past 240 hour(s)).  RADIOLOGY:  Dg Chest 2 View  Result Date: 11/11/2015 CLINICAL DATA:  Syncopal episode today, dehydration, renal failure. EXAM: CHEST  2 VIEW COMPARISON:  Chest x-ray of 01/23/2015 FINDINGS: No active infiltrate or effusion is seen. Mediastinal and hilar contours are unremarkable. The lungs are hyperaerated suggesting an element of emphysema. There is a retrocardiac opacity consistent with hiatal hernia. The bones are osteopenic. IMPRESSION: 1. Hyperaeration may indicate emphysema.  No active lung disease. 2. Hiatal hernia. Electronically Signed   By: Dwyane DeePaul  Barry M.D.   On: 11/11/2015 15:52   Ct Head Wo Contrast  Result Date: 11/11/2015 CLINICAL DATA:  Syncopal episode in the bathroom today.  Diarrhea. EXAM: CT HEAD WITHOUT CONTRAST TECHNIQUE: Contiguous axial images were obtained from the base of the skull through the vertex without intravenous contrast. COMPARISON:  07/29/2014 FINDINGS: Brain: There is no evidence of acute cortical infarct, intracranial hemorrhage, mass, midline shift, or extra-axial fluid collection. Mild cerebral atrophy is within normal limits for age. Vascular: Calcified  atherosclerosis at the skullbase. Skull: No skull fracture or focal osseous lesion. Sinuses/Orbits: No acute findings. Prior bilateral cataract extraction. Other: None. IMPRESSION: No evidence of acute intracranial abnormality. Electronically Signed   By: Jolaine ClickAllen  Grady M.D.  On: 11/11/2015 16:27   US Carotid Bilateral  Result Date: 11/12/2015 CLINICAL DATA:  82 year old female with a history of congestive heart failure. Cardiovascular risk factors include hypertension, hyperlipidemia, diabetes EXAM: BILATERAL CAROTID DUPLEX ULTRASOUND TECHNIQUE: Wallace Cullens scale imaging, color Doppler and duplex ultrasound were performed of bilateral carotid and vertebral arteries in the neck. COMPARISON:  No prior duplex FINDINGS: Criteria: Quantification of carotid stenosis is based on velocity parameters that correlate the residual internal carotid diameter with NASCET-based stenosis levels, using the diameter of the distal internal carotid lumen as the denominator for stenosis measurement. The following velocity measurements were obtained: RIGHT ICA:  Systolic 50 cm/sec, Diastolic 12 cm/sec CCA:  65 cm/sec SYSTOLIC ICA/CCA RATIO:  0.8 ECA:  79 cm/sec LEFT ICA:  Systolic 72 cm/sec, Diastolic 21 cm/sec CCA:  58 cm/sec SYSTOLIC ICA/CCA RATIO:  0.3 ECA:  55 cm/sec Right Brachial SBP: Not acquired Left Brachial SBP: Not acquired RIGHT CAROTID ARTERY: No significant calcifications of the right common carotid artery. Intermediate waveform maintained. Heterogeneous and partially calcified plaque at the right carotid bifurcation. No significant lumen shadowing. Low resistance waveform of the right ICA. Mild tortuosity RIGHT VERTEBRAL ARTERY: Antegrade flow with low resistance waveform. LEFT CAROTID ARTERY: No significant calcifications of the left common carotid artery. Intermediate waveform maintained. Heterogeneous and partially calcified plaque at the left carotid bifurcation without significant lumen shadowing. Low resistance waveform  of the left ICA. Mild tortuosity LEFT VERTEBRAL ARTERY:  Antegrade flow with low resistance waveform. IMPRESSION: Color duplex indicates minimal heterogeneous and calcified plaque, with no hemodynamically significant stenosis by duplex criteria in the extracranial cerebrovascular circulation. Signed, Yvone Neu. Loreta Ave, DO [facility] Electronically Signed   By: Gilmer Mor D.O.   On: 11/12/2015 10:02     Management plans discussed with the patient, family and they are in agreement.  CODE STATUS:     Code Status Orders        Start     Ordered   11/11/15 2230  Do not attempt resuscitation (DNR)  Continuous    Question Answer Comment  In the event of cardiac or respiratory ARREST Do not call a "code blue"   In the event of cardiac or respiratory ARREST Do not perform Intubation, CPR, defibrillation or ACLS   In the event of cardiac or respiratory ARREST Use medication by any route, position, wound care, and other measures to relive pain and suffering. May use oxygen, suction and manual treatment of airway obstruction as needed for comfort.      11/11/15 2229    Code Status History    Date Active Date Inactive Code Status Order ID Comments User Context   11/11/2015  7:37 PM 11/11/2015  9:00 PM Full Code [id number]  Enid Baas, MD Inpatient    Advance Directive Documentation   Flowsheet Row Most Recent Value  Type of Advance Directive  Healthcare Power of [place], Living will [place] Power of [place], [place] Dominic]  Pre-existing out of facility DNR order (yellow form or pink MOST form)  No data  "MOST" Form in Place?  No data      TOTAL TIME TAKING CARE OF THIS PATIENT: 40 minutes.    Abrea Henle M.D on 11/12/2015 at 4:20 PM  Between 7am to 6pm - Pager - [phone] After 6pm go to [email or web address] - password [email or web address]  [facility]  Office  [phone]  CC: Primary care physician; No primary care  provider on file.

## 2015-11-12 NOTE — Care Management (Signed)
Physical therapy evaluation completed. Recommends home with home health and physical therapy. Sons decline home health at this time. Bedside commode will be through [facility]. Gwenette GreetBrenda S Evans Levee RN MSN CCM Care management [phone]

## 2015-11-12 NOTE — Consult Note (Addendum)
Reason for Consult:Syncope Referring Physician: Allena Katz  CC: Syncope  HPI: Sara Lang is an 82 y.o. female with a history of dementia.  Unable to provide history due to dementia and history provided by son who was present for event.  According to son her symptoms started with diarrhea about 2-3 days ago, she presented to the emergency room. She did not have any abdominal pain or nausea or vomiting. C. difficile was negative at the time so she was given some fluids and discharged home. Over the last couple of days her stools have become more softer but her frequency was still high. She had a soft stool bowel movement today after which she felt like she still needed to go to the bathroom. She was sitting on the commode and then she passed out. She glazed over, her face became contorted, she had drooling and her teeth fell out.  She then lost consciousness and was lower to the floor.  Patient's son states that she was not straining on the commode. After getting to the floor she was incontinent of stool.  Her fingerstick at the time was greater than 200.  Past Medical History:  Diagnosis Date  . Abnormal pancreatic function study    elevated enzymes  . Backache, unspecified   . Carpal tunnel syndrome   . Chronic pain syndrome   . Disorder of bone and cartilage, unspecified   . Edema   . Esophageal reflux   . Gout, unspecified   . History of transfusion of whole blood   . Hyperpotassemia   . Jaundice, unspecified, not of newborn   . Memory loss   . Mononeuritis of unspecified site   . Osteoarthrosis, unspecified whether generalized or localized, unspecified site   . Other B-complex deficiencies   . Other chronic pain   . Other malaise and fatigue   . Pain in limb   . Peptic ulcer, unspecified site, unspecified as acute or chronic, without mention of hemorrhage, perforation, or obstruction   . Pure hypercholesterolemia   . Renal failure, unspecified    secondary to Vioxx  . Type II or  unspecified type diabetes mellitus without mention of complication, not stated as uncontrolled   . Unspecified adverse effect of unspecified drug, medicinal and biological substance   . Unspecified essential hypertension   . Unspecified hypothyroidism   . Unspecified vitamin D deficiency     Past Surgical History:  Procedure Laterality Date  . APPENDECTOMY  1928  . CATARACT EXTRACTION    . CHOLECYSTECTOMY  1956  . ESOPHAGOGASTRODUODENOSCOPY     stricture  . THYROIDECTOMY     throat surgery-cold nodule  . TOE AMPUTATION     x2 Hammer toes  . TOE AMPUTATION    . toe nail procedure    . TONSILLECTOMY      Family History  Problem Relation Age of Onset  . Hyperlipidemia Father   . Arthritis Father   . Transient ischemic attack Son   . Depression Son   . Arthritis Mother   . Arthritis      Grandparents    Social History:  reports that she has never smoked. She has never used smokeless tobacco. She reports that she does not drink alcohol or use drugs.  Allergies  Allergen Reactions  . Ace Inhibitors Other (See Comments)    Reaction: ace inhibitors cause inc K-  . Ativan [Lorazepam] Other (See Comments)  . Calcium Nausea Only  . Donepezil Hydrochloride Other (See Comments)  Reaction: MS change  . Latex Itching and Other (See Comments)    Reaction: Red skin  . Memantine Other (See Comments)    Reaction: MS change  . Oxycodone-Aspirin Other (See Comments)    Reaction: Unknown  . Penicillins Other (See Comments)    Reaction: Unknown  . Pregabalin Nausea And Vomiting  . Rofecoxib Other (See Comments)    Reaction: Renal failure  . Sulfonamide Derivatives Other (See Comments)    Reaction: Unknown    Medications:  I have reviewed the patient's current medications. Prior to Admission:  Prescriptions Prior to Admission  Medication Sig Dispense Refill Last Dose  . amLODipine (NORVASC) 5 MG tablet TAKE ONE TABLET BY MOUTH EVERY 12 HOURS 180 tablet 2 11/11/2015 at Unknown  time  . diphenoxylate-atropine (LOMOTIL) 2.5-0.025 MG tablet Take 1 tablet by mouth 4 (four) times daily as needed for diarrhea or loose stools. 30 tablet 1 prn at prn  . furosemide (LASIX) 40 MG tablet TAKE 1 TABLET BY MOUTH DAILY 30 tablet 5 11/11/2015 at Unknown time  . insulin lispro (HUMALOG KWIKPEN) 100 UNIT/ML KiwkPen Inject under skin up to 8 units 3x a day 15 min before a meal (Patient taking differently: Inject 8 Units into the skin 3 (three) times daily. Inject under skin up to 8 units 3x a day 15 min before a meal) 15 mL 11 11/11/2015 at Unknown time  . levothyroxine (SYNTHROID, LEVOTHROID) 88 MCG tablet TAKE 1 TABLET BY MOUTH DAILY 30 tablet 5 11/11/2015 at Unknown time  . omeprazole (PRILOSEC) 20 MG capsule TAKE ONE CAPSULE BY MOUTH DAILY 90 capsule 2 11/11/2015 at Unknown time  . potassium chloride (K-DUR,KLOR-CON) 10 MEQ tablet TAKE 1 TABLET BY MOUTH DAILY WITH A MEAL 90 tablet 1 11/11/2015 at Unknown time  . rivastigmine (EXELON) 4.6 mg/24hr APPLY ONE PATCH TO A HAIRLESS AREA ON THE SKIN ONCE DAILY AS DIRECTED. REMOVE OLD PATCH BEFORE APPLYING NEW ONE. 30 patch 11 11/10/2015 at Unknown time  . cephALEXin (KEFLEX) 500 MG capsule Take 1 capsule (500 mg total) by mouth 3 (three) times daily. (Patient not taking: Reported on 11/11/2015) 30 capsule 0 Not Taking at Unknown time  . food thickener (THICK IT) POWD Take 1 Container by mouth as needed.    Taking  . furosemide (LASIX) 20 MG tablet Take 20 mg by mouth daily.    Not Taking at Unknown time  . Insulin Syringe-Needle U-100 (B-D INS SYR ULTRAFINE .5CC/30G) 30G X 1/2" 0.5 ML MISC Use as directed 100 each 0 Taking  . lidocaine (LIDODERM) 5 % Place 1 patch onto the skin daily. Remove & Discard patch within 12 hours or as directed by MD    Not Taking at Unknown time  . Vitamin D, Ergocalciferol, (DRISDOL) 50000 UNITS CAPS capsule Take 50,000 Units by mouth every 7 (seven) days.   Not Taking at Unknown time   Scheduled: . amLODipine  5 mg Oral  Q12H  . cephALEXin  500 mg Oral Q12H  . enoxaparin (LOVENOX) injection  30 mg Subcutaneous Q24H  . insulin aspart  0-5 Units Subcutaneous QHS  . insulin aspart  0-9 Units Subcutaneous TID WC  . levothyroxine  88 mcg Oral QAC breakfast  . lidocaine  1 patch Transdermal Q24H  . pantoprazole  40 mg Oral Daily  . rivastigmine  4.6 mg Transdermal Daily  . sodium chloride flush  3 mL Intravenous Q12H  . Vitamin D (Ergocalciferol)  50,000 Units Oral Q7 days    ROS: History obtained  from son  General ROS: negative for - chills, fatigue, fever, night sweats, weight gain or weight loss Psychological ROS: negative for - behavioral disorder, hallucinations, memory difficulties, mood swings or suicidal ideation Ophthalmic ROS: negative for - blurry vision, double vision, eye pain or loss of vision ENT ROS: negative for - epistaxis, nasal discharge, oral lesions, sore throat, tinnitus or vertigo Allergy and Immunology ROS: negative for - hives or itchy/watery eyes Hematological and Lymphatic ROS: negative for - bleeding problems, bruising or swollen lymph nodes Endocrine ROS: negative for - galactorrhea, hair pattern changes, polydipsia/polyuria or temperature intolerance Respiratory ROS: negative for - cough, hemoptysis, shortness of breath or wheezing Cardiovascular ROS: negative for - chest pain, dyspnea on exertion, edema or irregular heartbeat Gastrointestinal ROS: diarrhea Genito-Urinary ROS: negative for - dysuria, hematuria, incontinence or urinary frequency/urgency Musculoskeletal ROS: negative for - joint swelling or muscular weakness Neurological ROS: as noted in HPI Dermatological ROS: negative for rash and skin lesion changes  Physical Examination: Blood pressure (!) 143/76, pulse (!) 58, temperature 98.5 F (36.9 C), temperature source Oral, resp. rate 16, height 5\' 4"  (1.626 m), weight 53.6 kg (118 lb 1.6 oz), SpO2 97 %.  HEENT-  Normocephalic, no lesions, without obvious  abnormality.  Normal external eye and conjunctiva.  Normal TM's bilaterally.  Normal auditory canals and external ears. Normal external nose, mucus membranes and septum.  Normal pharynx. Cardiovascular- S1, S2 normal, pulses palpable throughout   Lungs- chest clear, no wheezing, rales, normal symmetric air entry Abdomen- soft, non-tender; bowel sounds normal; no masses,  no organomegaly Extremities- no edema Lymph-no adenopathy palpable Musculoskeletal-joint deformations due to arthritis Skin-warm and dry, no hyperpigmentation, vitiligo, or suspicious lesions  Neurological Examination Mental Status: Alert, oriented to self only.  Speech fluent without evidence of aphasia.  Able to follow commands without difficulty. Cranial Nerves: II: Discs flat bilaterally; Visual fields grossly normal, right pupil slightly larger than left.  Left unreactive III,IV, VI: ptosis not present, extra-ocular motions intact bilaterally V,VII: smile symmetric, facial light touch sensation normal bilaterally VIII: hearing normal bilaterally IX,X: gag reflex present XI: bilateral shoulder shrug XII: midline tongue extension Motor: Right : Upper extremity   5/5 with limitation at the shoulder due to arthritis   Left:     Upper extremity   5/5  Lower extremity   5/5          Lower extremity   5/5 Tone and bulk:normal tone throughout; no atrophy noted Sensory: Pinprick and light touch intact throughout, bilaterally Deep Tendon Reflexes: 2+ in the upper extremities, trace at the knees and absent at the ankles.   Plantars: Right: upgoing   Left: mute Cerebellar: Normal finger-to-nose and normal heel-to-shin testing bilaterally Gait: not tested due to safety concerns    Laboratory Studies:   Basic Metabolic Panel:  Recent Labs Lab 11/09/15 1545 11/11/15 1350 11/12/15 0136  NA 139 141 139  K 3.8 3.9 3.5  CL 101 107 108  CO2 29 28 27   GLUCOSE 148* 189* 259*  BUN 27* 19 17  CREATININE 1.45* 1.22* 1.23*   CALCIUM 9.0 8.7* 8.3*  MG  --  1.4*  --     Liver Function Tests:  Recent Labs Lab 11/09/15 1545 11/11/15 1350  AST 17 19  ALT 12* 12*  ALKPHOS 86 84  BILITOT 1.2 1.1  PROT 6.8 6.2*  ALBUMIN 4.1 3.6    Recent Labs Lab 11/09/15 1545 11/11/15 1350  LIPASE 32 22   No results for input(s): AMMONIA  in the last 168 hours.  CBC:  Recent Labs Lab 11/09/15 1545 11/11/15 1350 11/12/15 0136  WBC 8.1 7.7 7.6  NEUTROABS  --  4.6  --   HGB 14.7 14.3 13.0  HCT 42.6 41.8 37.7  MCV 94.1 94.8 95.2  PLT 206 210 192    Cardiac Enzymes:  Recent Labs Lab 11/11/15 1350 11/11/15 1938 11/12/15 0136 11/12/15 0743  TROPONINI <0.03 <0.03 <0.03 <0.03    BNP: Invalid input(s): POCBNP  CBG:  Recent Labs Lab 11/11/15 1840 11/11/15 2203 11/12/15 0812 11/12/15 1006  GLUCAP 156* 173* 146* 174*    Microbiology: Results for orders placed or performed during the hospital encounter of 09/14/15  Urine culture     Status: Abnormal   Collection Time: 09/14/15  4:19 AM  Result Value Ref Range Status   Specimen Description URINE, RANDOM  Final   Special Requests NONE  Final   Culture (A)  Final    >=100,000 COLONIES/mL LACTOBACILLUS SPECIES Standardized susceptibility testing for this organism is not available. Performed at [facility]    Report Status 09/15/2015 FINAL  Final    Coagulation Studies: No results for input(s): LABPROT, INR in the last 72 hours.  Urinalysis:  Recent Labs Lab 11/09/15 1833 11/11/15 1449  COLORURINE YELLOW* YELLOW*  LABSPEC 1.012 1.014  PHURINE 6.0 5.0  GLUCOSEU NEGATIVE 50*  HGBUR NEGATIVE NEGATIVE  BILIRUBINUR NEGATIVE NEGATIVE  KETONESUR NEGATIVE NEGATIVE  PROTEINUR 30* 100*  NITRITE NEGATIVE NEGATIVE  LEUKOCYTESUR NEGATIVE NEGATIVE    Lipid Panel:     Component Value Date/Time   CHOL 238 (H) 04/18/2014 0902   CHOL 196 05/08/2012 0614   TRIG 235.0 (H) 04/18/2014 0902   TRIG 246 (H) 05/08/2012 0614   HDL 44.20  04/18/2014 0902   HDL 39 (L) 05/08/2012 0614   CHOLHDL 5 04/18/2014 0902   VLDL 47.0 (H) 04/18/2014 0902   VLDL 49 (H) 05/08/2012 0614   LDLCALC 108 (H) 05/08/2012 0614    HgbA1C:  Lab Results  Component Value Date   HGBA1C 7.8 (H) 03/06/2015    Urine Drug Screen:  No results found for: LABOPIA, COCAINSCRNUR, LABBENZ, AMPHETMU, THCU, LABBARB  Alcohol Level: No results for input(s): ETH in the last 168 hours.  Other results: EKG: sinus rhythm at 64 bpm.  Imaging: Dg Chest 2 View  Result Date: 11/11/2015 CLINICAL DATA:  Syncopal episode today, dehydration, renal failure. EXAM: CHEST  2 VIEW COMPARISON:  Chest x-ray of 01/23/2015 FINDINGS: No active infiltrate or effusion is seen. Mediastinal and hilar contours are unremarkable. The lungs are hyperaerated suggesting an element of emphysema. There is a retrocardiac opacity consistent with hiatal hernia. The bones are osteopenic. IMPRESSION: 1. Hyperaeration may indicate emphysema.  No active lung disease. 2. Hiatal hernia. Electronically Signed   By: Dwyane Dee M.D.   On: 11/11/2015 15:52   Ct Head Wo Contrast  Result Date: 11/11/2015 CLINICAL DATA:  Syncopal episode in the bathroom today.  Diarrhea. EXAM: CT HEAD WITHOUT CONTRAST TECHNIQUE: Contiguous axial images were obtained from the base of the skull through the vertex without intravenous contrast. COMPARISON:  07/29/2014 FINDINGS: Brain: There is no evidence of acute cortical infarct, intracranial hemorrhage, mass, midline shift, or extra-axial fluid collection. Mild cerebral atrophy is within normal limits for age. Vascular: Calcified atherosclerosis at the skullbase. Skull: No skull fracture or focal osseous lesion. Sinuses/Orbits: No acute findings. Prior bilateral cataract extraction. Other: None. IMPRESSION: No evidence of acute intracranial abnormality. Electronically Signed   By: Freida Busman  Mosetta Putt M.D.   On: 11/11/2015 16:27   US Carotid Bilateral  Result Date: 11/12/2015 CLINICAL  DATA:  82 year old female with a history of congestive heart failure. Cardiovascular risk factors include hypertension, hyperlipidemia, diabetes EXAM: BILATERAL CAROTID DUPLEX ULTRASOUND TECHNIQUE: Wallace Cullens scale imaging, color Doppler and duplex ultrasound were performed of bilateral carotid and vertebral arteries in the neck. COMPARISON:  No prior duplex FINDINGS: Criteria: Quantification of carotid stenosis is based on velocity parameters that correlate the residual internal carotid diameter with NASCET-based stenosis levels, using the diameter of the distal internal carotid lumen as the denominator for stenosis measurement. The following velocity measurements were obtained: RIGHT ICA:  Systolic 50 cm/sec, Diastolic 12 cm/sec CCA:  65 cm/sec SYSTOLIC ICA/CCA RATIO:  0.8 ECA:  79 cm/sec LEFT ICA:  Systolic 72 cm/sec, Diastolic 21 cm/sec CCA:  58 cm/sec SYSTOLIC ICA/CCA RATIO:  0.3 ECA:  55 cm/sec Right Brachial SBP: Not acquired Left Brachial SBP: Not acquired RIGHT CAROTID ARTERY: No significant calcifications of the right common carotid artery. Intermediate waveform maintained. Heterogeneous and partially calcified plaque at the right carotid bifurcation. No significant lumen shadowing. Low resistance waveform of the right ICA. Mild tortuosity RIGHT VERTEBRAL ARTERY: Antegrade flow with low resistance waveform. LEFT CAROTID ARTERY: No significant calcifications of the left common carotid artery. Intermediate waveform maintained. Heterogeneous and partially calcified plaque at the left carotid bifurcation without significant lumen shadowing. Low resistance waveform of the left ICA. Mild tortuosity LEFT VERTEBRAL ARTERY:  Antegrade flow with low resistance waveform. IMPRESSION: Color duplex indicates minimal heterogeneous and calcified plaque, with no hemodynamically significant stenosis by duplex criteria in the extracranial cerebrovascular circulation. Signed, Yvone Neu. Loreta Ave, DO [facility] Electronically Signed   By: Gilmer Mor D.O.   On: 11/12/2015 10:02     Assessment/Plan: 82 year old female with what by history appears to be a syncopal seizure.  Head CT personally reviewed and shows no acute changes.  EEG shows no epileptiform activity.  Seizure considered provoked.  Anticonvulsant therapy not indicated at this time.  Conversation had at length with son.  Would not prescribe abortive therapy for home either.  If further events patient to be brought back to the ED.  Patient lives at home with sons.   TSH 6.268.  Recommendations: 1.  F/U with neurology as an outpatient 2.  No anticonvulsant therapy indicated at this time.   3.  Thyroid function to be addressed if appropriate  Thana Farr, MD Neurology [phone] 11/12/2015, 3:02 PM

## 2015-11-12 NOTE — Progress Notes (Signed)
Pt discharged today, discharge instructions given to pt's son and POA. They verified understanding. No paper prescriptions given to patient, written instructions given to where to pick them up. Pt belongings packed and given to patient. She was rolled out in wheelchair by staff.

## 2015-11-13 DIAGNOSIS — I10 Essential (primary) hypertension: Secondary | ICD-10-CM | POA: Diagnosis not present

## 2015-11-13 DIAGNOSIS — Z87898 Personal history of other specified conditions: Secondary | ICD-10-CM | POA: Diagnosis not present

## 2015-11-13 DIAGNOSIS — R55 Syncope and collapse: Secondary | ICD-10-CM | POA: Diagnosis not present

## 2015-11-13 DIAGNOSIS — E039 Hypothyroidism, unspecified: Secondary | ICD-10-CM | POA: Diagnosis not present

## 2015-11-13 DIAGNOSIS — E7800 Pure hypercholesterolemia, unspecified: Secondary | ICD-10-CM | POA: Diagnosis not present

## 2015-11-13 LAB — URINE CULTURE: Culture: NO GROWTH

## 2015-11-13 LAB — HEMOGLOBIN A1C
HEMOGLOBIN A1C: 7.5 % — AB (ref 4.8–5.6)
MEAN PLASMA GLUCOSE: 169 mg/dL

## 2015-11-23 DIAGNOSIS — Z87898 Personal history of other specified conditions: Secondary | ICD-10-CM | POA: Diagnosis not present

## 2015-11-24 ENCOUNTER — Inpatient Hospital Stay
Admission: EM | Admit: 2015-11-24 | Discharge: 2015-11-26 | DRG: 641 | Disposition: A | Discharge status: Hospice | Payer: Medicare Other | Attending: Internal Medicine | Admitting: Internal Medicine

## 2015-11-24 ENCOUNTER — Encounter: Payer: Self-pay | Admitting: *Deleted

## 2015-11-24 ENCOUNTER — Emergency Department: Payer: Medicare Other

## 2015-11-24 DIAGNOSIS — Z66 Do not resuscitate: Secondary | ICD-10-CM | POA: Diagnosis present

## 2015-11-24 DIAGNOSIS — E860 Dehydration: Principal | ICD-10-CM | POA: Diagnosis present

## 2015-11-24 DIAGNOSIS — Z89429 Acquired absence of other toe(s), unspecified side: Secondary | ICD-10-CM

## 2015-11-24 DIAGNOSIS — K5900 Constipation, unspecified: Secondary | ICD-10-CM | POA: Diagnosis present

## 2015-11-24 DIAGNOSIS — R609 Edema, unspecified: Secondary | ICD-10-CM | POA: Diagnosis not present

## 2015-11-24 DIAGNOSIS — Z794 Long term (current) use of insulin: Secondary | ICD-10-CM | POA: Diagnosis not present

## 2015-11-24 DIAGNOSIS — G894 Chronic pain syndrome: Secondary | ICD-10-CM | POA: Diagnosis present

## 2015-11-24 DIAGNOSIS — R627 Adult failure to thrive: Secondary | ICD-10-CM | POA: Diagnosis present

## 2015-11-24 DIAGNOSIS — Z79899 Other long term (current) drug therapy: Secondary | ICD-10-CM | POA: Diagnosis not present

## 2015-11-24 DIAGNOSIS — Z8711 Personal history of peptic ulcer disease: Secondary | ICD-10-CM

## 2015-11-24 DIAGNOSIS — M6281 Muscle weakness (generalized): Secondary | ICD-10-CM

## 2015-11-24 DIAGNOSIS — Z515 Encounter for palliative care: Secondary | ICD-10-CM | POA: Diagnosis present

## 2015-11-24 DIAGNOSIS — Z91040 Latex allergy status: Secondary | ICD-10-CM | POA: Diagnosis not present

## 2015-11-24 DIAGNOSIS — Z882 Allergy status to sulfonamides status: Secondary | ICD-10-CM | POA: Diagnosis not present

## 2015-11-24 DIAGNOSIS — Z888 Allergy status to other drugs, medicaments and biological substances status: Secondary | ICD-10-CM | POA: Diagnosis not present

## 2015-11-24 DIAGNOSIS — K449 Diaphragmatic hernia without obstruction or gangrene: Secondary | ICD-10-CM | POA: Diagnosis present

## 2015-11-24 DIAGNOSIS — Z880 Allergy status to penicillin: Secondary | ICD-10-CM

## 2015-11-24 DIAGNOSIS — E119 Type 2 diabetes mellitus without complications: Secondary | ICD-10-CM | POA: Diagnosis not present

## 2015-11-24 DIAGNOSIS — I10 Essential (primary) hypertension: Secondary | ICD-10-CM | POA: Diagnosis present

## 2015-11-24 DIAGNOSIS — F0280 Dementia in other diseases classified elsewhere without behavioral disturbance: Secondary | ICD-10-CM | POA: Diagnosis present

## 2015-11-24 DIAGNOSIS — G309 Alzheimer's disease, unspecified: Secondary | ICD-10-CM | POA: Diagnosis not present

## 2015-11-24 DIAGNOSIS — K219 Gastro-esophageal reflux disease without esophagitis: Secondary | ICD-10-CM | POA: Diagnosis present

## 2015-11-24 DIAGNOSIS — R0602 Shortness of breath: Secondary | ICD-10-CM | POA: Diagnosis not present

## 2015-11-24 DIAGNOSIS — R262 Difficulty in walking, not elsewhere classified: Secondary | ICD-10-CM

## 2015-11-24 DIAGNOSIS — M109 Gout, unspecified: Secondary | ICD-10-CM | POA: Diagnosis present

## 2015-11-24 DIAGNOSIS — Z8261 Family history of arthritis: Secondary | ICD-10-CM | POA: Diagnosis not present

## 2015-11-24 DIAGNOSIS — E039 Hypothyroidism, unspecified: Secondary | ICD-10-CM | POA: Diagnosis present

## 2015-11-24 DIAGNOSIS — Z0389 Encounter for observation for other suspected diseases and conditions ruled out: Secondary | ICD-10-CM | POA: Diagnosis not present

## 2015-11-24 DIAGNOSIS — M1990 Unspecified osteoarthritis, unspecified site: Secondary | ICD-10-CM | POA: Diagnosis present

## 2015-11-24 LAB — COMPREHENSIVE METABOLIC PANEL
ALT: 10 U/L — AB (ref 14–54)
AST: 19 U/L (ref 15–41)
Albumin: 4.1 g/dL (ref 3.5–5.0)
Alkaline Phosphatase: 91 U/L (ref 38–126)
Anion gap: 9 (ref 5–15)
BUN: 31 mg/dL — AB (ref 6–20)
CHLORIDE: 97 mmol/L — AB (ref 101–111)
CO2: 29 mmol/L (ref 22–32)
CREATININE: 1.49 mg/dL — AB (ref 0.44–1.00)
Calcium: 9.2 mg/dL (ref 8.9–10.3)
GFR calc Af Amer: 34 mL/min — ABNORMAL LOW (ref >60)
GFR, EST NON AFRICAN AMERICAN: 29 mL/min — AB (ref >60)
GLUCOSE: 185 mg/dL — AB (ref 65–99)
Potassium: 3.6 mmol/L (ref 3.5–5.1)
Sodium: 135 mmol/L (ref 135–145)
Total Bilirubin: 0.4 mg/dL (ref 0.3–1.2)
Total Protein: 7 g/dL (ref 6.5–8.1)

## 2015-11-24 LAB — CBC WITH DIFFERENTIAL/PLATELET
BASOS PCT: 1 %
Basophils Absolute: 0.1 10*3/uL (ref 0–0.1)
EOS PCT: 2 %
Eosinophils Absolute: 0.1 10*3/uL (ref 0–0.7)
HEMATOCRIT: 41.2 % (ref 35.0–47.0)
Hemoglobin: 14.2 g/dL (ref 12.0–16.0)
LYMPHS ABS: 4.3 10*3/uL — AB (ref 1.0–3.6)
Lymphocytes Relative: 58 %
MCH: 32.5 pg (ref 26.0–34.0)
MCHC: 34.6 g/dL (ref 32.0–36.0)
MCV: 94 fL (ref 80.0–100.0)
MONOS PCT: 7 %
Monocytes Absolute: 0.5 10*3/uL (ref 0.2–0.9)
NEUTROS ABS: 2.4 10*3/uL (ref 1.4–6.5)
Neutrophils Relative %: 32 %
Platelets: 259 10*3/uL (ref 150–440)
RBC: 4.38 MIL/uL (ref 3.80–5.20)
RDW: 13.7 % (ref 11.5–14.5)
WBC: 7.4 10*3/uL (ref 3.6–11.0)

## 2015-11-24 LAB — URINALYSIS COMPLETE WITH MICROSCOPIC (ARMC ONLY)
BACTERIA UA: NONE SEEN
BILIRUBIN URINE: NEGATIVE
GLUCOSE, UA: NEGATIVE mg/dL
HGB URINE DIPSTICK: NEGATIVE
Ketones, ur: NEGATIVE mg/dL
Leukocytes, UA: NEGATIVE
Nitrite: NEGATIVE
PH: 5 (ref 5.0–8.0)
PROTEIN: NEGATIVE mg/dL
RBC / HPF: NONE SEEN RBC/hpf (ref 0–5)
Specific Gravity, Urine: 1.011 (ref 1.005–1.030)
WBC UA: NONE SEEN WBC/hpf (ref 0–5)

## 2015-11-24 LAB — TROPONIN I

## 2015-11-24 LAB — BRAIN NATRIURETIC PEPTIDE: B NATRIURETIC PEPTIDE 5: 46 pg/mL (ref 0.0–100.0)

## 2015-11-24 LAB — GLUCOSE, CAPILLARY: Glucose-Capillary: 186 mg/dL — ABNORMAL HIGH (ref 65–99)

## 2015-11-24 MED ORDER — SODIUM CHLORIDE 0.9 % IV SOLN
Freq: Once | INTRAVENOUS | Status: AC
  Administered 2015-11-24: 19:00:00 via INTRAVENOUS

## 2015-11-24 MED ORDER — IOPAMIDOL (ISOVUE-300) INJECTION 61%
30.0000 mL | Freq: Once | INTRAVENOUS | Status: AC
  Administered 2015-11-24: 30 mL via ORAL

## 2015-11-24 NOTE — ED Notes (Signed)
ekg done and pt taken to room 13.  Family with pt.

## 2015-11-24 NOTE — ED Provider Notes (Addendum)
[facility] Emergency Department Provider Note   ____________________________________________   First MD Initiated Contact with Patient 11/24/15 1739     (approximate)  I have reviewed the triage vital signs and the nursing notes.   HISTORY  Chief Complaint Dizziness  History limited by dementia HPI Sara Lang is a 82 y.o. female who complains of some dizziness when standing. Oxygen saturation saturations were about 95 instead of the usual 97 patient seemed to be short of breath. Patient is not eating or drinking well at home. Patient's blood sugars been going up and down. Patient is not stooling as much as usual either. Family is wondering about her prognosis. They are working with her constantly trying to get her to eat and drink. She had indicated to them when she did not have Alzheimer's disease and she did not want to have any tube feeding or anything else similar to that. She does not appear to be in any distress whatsoever. She only says that she is tired at the present time.   Past Medical History:  Diagnosis Date  . Abnormal pancreatic function study    elevated enzymes  . Backache, unspecified   . Carpal tunnel syndrome   . Chronic pain syndrome   . Disorder of bone and cartilage, unspecified   . Edema   . Esophageal reflux   . Gout, unspecified   . History of transfusion of whole blood   . Hyperpotassemia   . Jaundice, unspecified, not of newborn   . Memory loss   . Mononeuritis of unspecified site   . Osteoarthrosis, unspecified whether generalized or localized, unspecified site   . Other B-complex deficiencies   . Other chronic pain   . Other malaise and fatigue   . Pain in limb   . Peptic ulcer, unspecified site, unspecified as acute or chronic, without mention of hemorrhage, perforation, or obstruction   . Pure hypercholesterolemia   . Renal failure, unspecified    secondary to Vioxx  . Type II or unspecified type diabetes  mellitus without mention of complication, not stated as uncontrolled   . Unspecified adverse effect of unspecified drug, medicinal and biological substance   . Unspecified essential hypertension   . Unspecified hypothyroidism   . Unspecified vitamin D deficiency     Patient Active Problem List   Diagnosis Date Noted  . Syncopal seizure (HCC) 11/11/2015  . Diarrhea 11/09/2015  . FTT (failure to thrive) in adult 07/22/2015  . Decreased appetite 03/06/2015  . Fatigue 03/06/2015  . Incontinence 03/06/2015  . Skin tear of lower leg without complication 08/07/2014  . Fall at home 08/04/2014  . CHF (congestive heart failure) (HCC) 07/04/2014  . Nocturnal hypoxia 07/04/2014  . Encounter for Medicare annual wellness exam 04/27/2014  . Hypokalemia 05/20/2013  . At risk for falling 12/06/2011  . HYPERKALEMIA 04/23/2010  . ADVERSE DRUG REACTION 03/27/2009  . GOUT 09/25/2008  . OTHER CHRONIC PAIN 08/14/2008  . B12 deficiency 07/02/2008  . Vitamin D deficiency 07/02/2008  . Dementia arising in the senium and presenium 07/02/2008  . FATIGUE 06/25/2008  . BACK PAIN, CHRONIC 08/04/2006  . FOOT PAIN, CHRONIC 08/04/2006  . Osteopenia 08/04/2006  . Hypothyroidism 08/02/2006  . Diabetes type 2, controlled (HCC) 08/02/2006  . HYPERCHOLESTEROLEMIA 08/02/2006  . SYNDROME, CHRONIC PAIN 08/02/2006  . CARPAL TUNNEL SYNDROME 08/02/2006  . NEUROPATHY 08/02/2006  . Essential hypertension 08/02/2006  . GERD 08/02/2006  . PEPTIC ULCER DISEASE 08/02/2006  . Renal failure 08/02/2006  .  Osteoarthritis 08/02/2006  . JAUNDICE 08/02/2006  . PEDAL EDEMA 08/02/2006    Past Surgical History:  Procedure Laterality Date  . APPENDECTOMY  1928  . CATARACT EXTRACTION    . CHOLECYSTECTOMY  1956  . ESOPHAGOGASTRODUODENOSCOPY     stricture  . THYROIDECTOMY     throat surgery-cold nodule  . TOE AMPUTATION     x2 Hammer toes  . TOE AMPUTATION    . toe nail procedure    . TONSILLECTOMY      Prior to  Admission medications   Medication Sig Start Date End Date Taking? Authorizing Provider  amLODipine (NORVASC) 5 MG tablet Take 1 tablet (5 mg total) by mouth every 12 (twelve) hours. 11/12/15   Enedina Finner, MD  diphenoxylate-atropine (LOMOTIL) 2.5-0.025 MG tablet Take 1 tablet by mouth 4 (four) times daily as needed for diarrhea or loose stools. 11/09/15 11/08/16  Emily Filbert, MD  food thickener (THICK IT) POWD Take 1 Container by mouth as needed.     Historical Provider, MD  furosemide (LASIX) 20 MG tablet Take 1 tablet (20 mg total) by mouth as needed. If pt has leg edema and sob 11/12/15   Enedina Finner, MD  insulin lispro (HUMALOG KWIKPEN) 100 UNIT/ML KiwkPen Inject under skin up to 8 units 3x a day 15 min before a meal Patient taking differently: Inject 8 Units into the skin 3 (three) times daily. Inject under skin up to 8 units 3x a day 15 min before a meal 05/22/13   Carlus Pavlov, MD  Insulin Syringe-Needle U-100 (B-D INS SYR ULTRAFINE .5CC/30G) 30G X 1/2" 0.5 ML MISC Use as directed 02/15/13   Judy Pimple, MD  levothyroxine (SYNTHROID, LEVOTHROID) 88 MCG tablet TAKE 1 TABLET BY MOUTH DAILY 08/05/15   Judy Pimple, MD  lidocaine (LIDODERM) 5 % Place 1 patch onto the skin daily. Remove & Discard patch within 12 hours or as directed by MD     Historical Provider, MD  omeprazole (PRILOSEC) 20 MG capsule TAKE ONE CAPSULE BY MOUTH DAILY 04/02/15   Judy Pimple, MD  potassium chloride (K-DUR,KLOR-CON) 10 MEQ tablet Take 1 tablet (10 mEq total) by mouth as needed. When you use lasix 11/12/15   Enedina Finner, MD  rivastigmine (EXELON) 4.6 mg/24hr APPLY ONE PATCH TO A HAIRLESS AREA ON THE SKIN ONCE DAILY AS DIRECTED. REMOVE OLD PATCH BEFORE APPLYING NEW ONE. 11/09/15   Judy Pimple, MD  Vitamin D, Ergocalciferol, (DRISDOL) 50000 UNITS CAPS capsule Take 50,000 Units by mouth every 7 (seven) days.    Historical Provider, MD    Allergies Ace inhibitors; Ativan [lorazepam]; Calcium; Donepezil  hydrochloride; Latex; Memantine; Oxycodone-aspirin; Penicillins; Pregabalin; Rofecoxib; and Sulfonamide derivatives  Family History  Problem Relation Age of Onset  . Hyperlipidemia Father   . Arthritis Father   . Transient ischemic attack Son   . Depression Son   . Arthritis Mother   . Arthritis      Grandparents    Social History Social History  Substance Use Topics  . Smoking status: Never Smoker  . Smokeless tobacco: Never Used  . Alcohol use No    Review of Systems Constitutional: No fever/chills Eyes: No visual changes. ENT: No sore throat. Cardiovascular: Denies chest pain. Respiratory: Denies shortness of breath. Gastrointestinal: No abdominal pain.  No nausea, no vomiting.  No diarrhea.  No constipation. Genitourinary: Negative for dysuria. Musculoskeletal: Negative for back pain. Skin: Negative for rash. Neurological: Negative for headaches, focal weakness or numbness.  10-point ROS  otherwise negative.  ____________________________________________   PHYSICAL EXAM:  VITAL SIGNS: ED Triage Vitals  Enc Vitals Group     BP 11/24/15 1728 (!) 157/85     Pulse Rate 11/24/15 1725 60     Resp 11/24/15 1725 20     Temp 11/24/15 1728 98 F (36.7 C)     Temp Source 11/24/15 1728 Oral     SpO2 11/24/15 1725 97 %     Weight 11/24/15 1726 116 lb (52.6 kg)     Height 11/24/15 1726 4\' 10"  (1.473 m)     Head Circumference --      Peak Flow --      Pain Score --      Pain Loc --      Pain Edu? --      Excl. in GC? --     Constitutional: Alert and orientedTo person and hospital Well appearing and in no acute distress. Eyes: Conjunctivae are normal. PERRL. EOMI. Head: Atraumatic. Nose: No congestion/rhinnorhea. Mouth/Throat: Mucous membranes are moist.  Oropharynx non-erythematous. Neck: No stridor.  Cardiovascular: Normal rate, regular rhythm. Grossly normal heart sounds.  Good peripheral circulation. Respiratory: Normal respiratory effort.  No retractions.  Lungs CTAB. Gastrointestinal: Soft and nontender. No distention. No abdominal bruits. No CVA tenderness. Musculoskeletal: No lower extremity tenderness nor edema.  No joint effusions. Neurologic:  Normal speech and language. No gross focal neurologic deficits are appreciated. Skin:  Skin is warm, dry and intact. No rash noted.   ____________________________________________   LABS (all labs ordered are listed, but only abnormal results are displayed)  Labs Reviewed  COMPREHENSIVE METABOLIC PANEL - Abnormal; Notable for the following:       Result Value   Chloride 97 (*)    Glucose, Bld 185 (*)    BUN 31 (*)    Creatinine, Ser 1.49 (*)    ALT 10 (*)    GFR calc non Af Amer 29 (*)    GFR calc Af Amer 34 (*)    All other components within normal limits  CBC WITH DIFFERENTIAL/PLATELET - Abnormal; Notable for the following:    Lymphs Abs 4.3 (*)    All other components within normal limits  URINALYSIS COMPLETEWITH MICROSCOPIC ([facility] ONLY) - Abnormal; Notable for the following:    Color, Urine YELLOW (*)    APPearance CLEAR (*)    Squamous Epithelial / LPF 0-5 (*)    All other components within normal limits  TROPONIN I  BRAIN NATRIURETIC PEPTIDE   ____________________________________________  EKG  EKG read and interpreted by me shows normal sinus rhythm rate of 60 normal axis no acute ST-T wave changes ____________________________________________  RADIOLOGY  Study Result   CLINICAL DATA:  Pt brought in by family with dizziness, low oxygen saturations, and fsbs changes. Last BM was 3 days ago. Pt reports sob. Pt alert.  EXAM: PORTABLE CHEST 1 VIEW  COMPARISON:  11/11/2015  FINDINGS: There is no focal parenchymal opacity. There is no pleural effusion or pneumothorax. The heart and mediastinal contours are unremarkable.  There is a large hiatal hernia.  The osseous structures are unremarkable.  IMPRESSION: No active disease.   Electronically Signed    By: Elige KoHetal  Patel   On: 11/24/2015 18:14    Study Result   CLINICAL DATA:  82 year old female with constipation.  EXAM: CT ABDOMEN AND PELVIS WITHOUT CONTRAST  TECHNIQUE: Multidetector CT imaging of the abdomen and pelvis was performed following the standard protocol without IV contrast.  COMPARISON:  CT of  the abdomen pelvis 11/25/2002 and MRI dated 03/06/2003  FINDINGS: Evaluation of this exam is limited in the absence of intravenous contrast.  Lower chest: Stable 4 mm left lung base nodule. The visualized lung bases are otherwise clear. There is coronary vascular calcification.  No intra-abdominal free air or free fluid.  Hepatobiliary: The liver appears unremarkable. No intrahepatic biliary ductal dilatation. Cholecystectomy.  Pancreas: Unremarkable. No pancreatic ductal dilatation or surrounding inflammatory changes.  Spleen: Normal in size without focal abnormality.  Adrenals/Urinary Tract: There adrenal glands appear unremarkable. There is moderate left renal atrophy and cortical thinning. There is no hydronephrosis or nephrolithiasis on either side. The visualized ureters and urinary bladder appear unremarkable.  Stomach/Bowel: There is a moderate size hiatal hernia containing portion of the stomach. There is no evidence of gastric outlet or small bowel obstruction or active inflammation. Moderate stool noted throughout the colon. Appendectomy.  Vascular/Lymphatic: There is moderate aortoiliac atherosclerotic disease. The aorta is tortuous. Evaluation of the aorta and IVC is limited on this noncontrast study. No portal venous gas identified. There is no adenopathy.  Reproductive: The uterus and the left ovary appear unremarkable. There is a small right inguinal hernia. A 2.0 x 2.5 cm low attenuating structure noted within the right inguinal hernia which appears to be extension of the uterine ligament and likely represents the right ovary.  Further evaluation with ultrasound is recommended. No definite inflammatory changes or fluid noted surrounding this structure in the inguinal canal.  Other: None  Musculoskeletal: Osteopenia with extensive degenerative changes of the spine as well as multilevel disc desiccation with vacuum phenomena. No definite acute fracture.  IMPRESSION: Moderate stool throughout the colon. No evidence of bowel obstruction or active inflammation.  Moderate size hiatal hernia containing portion of the stomach.  Small right inguinal hernia likely containing the right ovary. No definite associated inflammatory changes. Further evaluation with ultrasound recommended.   Electronically Signed   By: Elgie Collard M.D.   On: 11/24/2015 22:12    ____________________________________________   PROCEDURES  Procedure(s) performed:   Procedures  Critical Care performed:   ____________________________________________   INITIAL IMPRESSION / ASSESSMENT AND PLAN / ED COURSE  Pertinent labs & imaging results that were available during my care of the patient were reviewed by me and considered in my medical decision making (see chart for details).    Clinical Course     ____________________________________________   FINAL CLINICAL IMPRESSION(S) / ED DIAGNOSES  Final diagnoses:  Dehydration      NEW MEDICATIONS STARTED DURING THIS VISIT:  New Prescriptions   No medications on file     Note:  This document was prepared using Dragon voice recognition software and may include unintentional dictation errors.    Arnaldo Natal, MD 11/24/15 2212    Arnaldo Natal, MD 11/24/15 2219

## 2015-11-24 NOTE — ED Triage Notes (Signed)
Pt brought in by family with dizziness, low oxygen saturations, and fsbs changes.  Last BM was 3 days ago.  Pt reports sob.  Pt alert.  Hx alz dz

## 2015-11-25 ENCOUNTER — Encounter: Payer: Self-pay | Admitting: Emergency Medicine

## 2015-11-25 LAB — BASIC METABOLIC PANEL
ANION GAP: 7 (ref 5–15)
BUN: 26 mg/dL — ABNORMAL HIGH (ref 6–20)
CHLORIDE: 106 mmol/L (ref 101–111)
CO2: 27 mmol/L (ref 22–32)
CREATININE: 1.35 mg/dL — AB (ref 0.44–1.00)
Calcium: 8.6 mg/dL — ABNORMAL LOW (ref 8.9–10.3)
GFR calc non Af Amer: 33 mL/min — ABNORMAL LOW (ref >60)
GFR, EST AFRICAN AMERICAN: 38 mL/min — AB (ref >60)
GLUCOSE: 203 mg/dL — AB (ref 65–99)
Potassium: 3.1 mmol/L — ABNORMAL LOW (ref 3.5–5.1)
Sodium: 140 mmol/L (ref 135–145)

## 2015-11-25 LAB — GLUCOSE, CAPILLARY
GLUCOSE-CAPILLARY: 155 mg/dL — AB (ref 65–99)
GLUCOSE-CAPILLARY: 187 mg/dL — AB (ref 65–99)
GLUCOSE-CAPILLARY: 164 mg/dL — AB (ref 65–99)
Glucose-Capillary: 148 mg/dL — ABNORMAL HIGH (ref 65–99)

## 2015-11-25 LAB — CBC
HCT: 36.9 % (ref 35.0–47.0)
HEMOGLOBIN: 12.7 g/dL (ref 12.0–16.0)
MCH: 32.9 pg (ref 26.0–34.0)
MCHC: 34.5 g/dL (ref 32.0–36.0)
MCV: 95.3 fL (ref 80.0–100.0)
Platelets: 243 10*3/uL (ref 150–440)
RBC: 3.87 MIL/uL (ref 3.80–5.20)
RDW: 13.8 % (ref 11.5–14.5)
WBC: 7.3 10*3/uL (ref 3.6–11.0)

## 2015-11-25 LAB — PHOSPHORUS: Phosphorus: 3 mg/dL (ref 2.5–4.6)

## 2015-11-25 LAB — MAGNESIUM: Magnesium: 1.6 mg/dL — ABNORMAL LOW (ref 1.7–2.4)

## 2015-11-25 LAB — TSH: TSH: 5.361 u[IU]/mL — ABNORMAL HIGH (ref 0.350–4.500)

## 2015-11-25 MED ORDER — MAGNESIUM CITRATE PO SOLN
1.0000 | Freq: Once | ORAL | Status: DC | PRN
  Filled 2015-11-25: qty 296

## 2015-11-25 MED ORDER — AMLODIPINE BESYLATE 5 MG PO TABS
5.0000 mg | ORAL_TABLET | Freq: Every day | ORAL | Status: DC
Start: 2015-11-25 — End: 2015-11-26
  Administered 2015-11-25 – 2015-11-26 (×2): 5 mg via ORAL
  Filled 2015-11-25 (×2): qty 1

## 2015-11-25 MED ORDER — DIPHENOXYLATE-ATROPINE 2.5-0.025 MG PO TABS: 1.0000 | ORAL_TABLET | Freq: Four times a day (QID) | ORAL | Status: DC | PRN

## 2015-11-25 MED ORDER — MAGNESIUM SULFATE 2 GM/50ML IV SOLN
2.0000 g | Freq: Once | INTRAVENOUS | Status: AC
  Administered 2015-11-25: 2 g via INTRAVENOUS
  Filled 2015-11-25: qty 50

## 2015-11-25 MED ORDER — PANTOPRAZOLE SODIUM 40 MG PO TBEC
40.0000 mg | DELAYED_RELEASE_TABLET | Freq: Every day | ORAL | Status: DC
  Administered 2015-11-25 – 2015-11-26 (×2): 40 mg via ORAL
  Filled 2015-11-25 (×2): qty 1

## 2015-11-25 MED ORDER — ENOXAPARIN SODIUM 40 MG/0.4ML ~~LOC~~ SOLN: 30.0000 mg | SUBCUTANEOUS | Status: DC

## 2015-11-25 MED ORDER — POTASSIUM CHLORIDE CRYS ER 20 MEQ PO TBCR: 10.0000 meq | EXTENDED_RELEASE_TABLET | ORAL | Status: DC | PRN

## 2015-11-25 MED ORDER — POTASSIUM CHLORIDE 20 MEQ PO PACK
40.0000 meq | PACK | Freq: Once | ORAL | Status: AC
  Administered 2015-11-25: 15:00:00 40 meq via ORAL
  Filled 2015-11-25: qty 2

## 2015-11-25 MED ORDER — ACETAMINOPHEN 650 MG RE SUPP: 650.0000 mg | Freq: Four times a day (QID) | RECTAL | Status: DC | PRN

## 2015-11-25 MED ORDER — MAGNESIUM HYDROXIDE 400 MG/5ML PO SUSP
30.0000 mL | Freq: Every day | ORAL | Status: DC | PRN
  Administered 2015-11-25: 12:00:00 30 mL via ORAL
  Filled 2015-11-25: qty 30

## 2015-11-25 MED ORDER — TRAMADOL HCL 50 MG PO TABS: 50.0000 mg | ORAL_TABLET | Freq: Four times a day (QID) | ORAL | Status: DC | PRN

## 2015-11-25 MED ORDER — DIPHENHYDRAMINE HCL 25 MG PO CAPS: 25.0000 mg | ORAL_CAPSULE | Freq: Four times a day (QID) | ORAL | Status: DC | PRN

## 2015-11-25 MED ORDER — ENOXAPARIN SODIUM 40 MG/0.4ML ~~LOC~~ SOLN: 40.0000 mg | SUBCUTANEOUS | Status: DC

## 2015-11-25 MED ORDER — SODIUM CHLORIDE 0.9 % IV SOLN
INTRAVENOUS | Status: DC
  Administered 2015-11-25 – 2015-11-26 (×3): via INTRAVENOUS

## 2015-11-25 MED ORDER — STARCH (THICKENING) PO POWD
1.0000 | ORAL | Status: DC | PRN
  Filled 2015-11-25: qty 227

## 2015-11-25 MED ORDER — POTASSIUM CHLORIDE 20 MEQ PO PACK: 40.0000 meq | PACK | ORAL | Status: DC

## 2015-11-25 MED ORDER — LEVOTHYROXINE SODIUM 88 MCG PO TABS
88.0000 ug | ORAL_TABLET | Freq: Every day | ORAL | Status: DC
  Administered 2015-11-25 – 2015-11-26 (×2): 88 ug via ORAL
  Filled 2015-11-25 (×2): qty 1

## 2015-11-25 MED ORDER — INSULIN ASPART 100 UNIT/ML ~~LOC~~ SOLN: 0.0000 [IU] | Freq: Every day | SUBCUTANEOUS | Status: DC

## 2015-11-25 MED ORDER — ONDANSETRON HCL 4 MG PO TABS: 4.0000 mg | ORAL_TABLET | Freq: Four times a day (QID) | ORAL | Status: DC | PRN

## 2015-11-25 MED ORDER — INSULIN ASPART 100 UNIT/ML ~~LOC~~ SOLN
0.0000 [IU] | Freq: Three times a day (TID) | SUBCUTANEOUS | Status: DC
  Administered 2015-11-25 (×2): 2 [IU] via SUBCUTANEOUS
  Administered 2015-11-25: 1 [IU] via SUBCUTANEOUS
  Administered 2015-11-26: 2 [IU] via SUBCUTANEOUS
  Administered 2015-11-26: 12:00:00 3 [IU] via SUBCUTANEOUS
  Filled 2015-11-25 (×2): qty 2
  Filled 2015-11-25: qty 3
  Filled 2015-11-25: qty 2
  Filled 2015-11-25: qty 1

## 2015-11-25 MED ORDER — SENNOSIDES-DOCUSATE SODIUM 8.6-50 MG PO TABS: 1.0000 | ORAL_TABLET | Freq: Every evening | ORAL | Status: DC | PRN

## 2015-11-25 MED ORDER — BISACODYL 5 MG PO TBEC
5.0000 mg | DELAYED_RELEASE_TABLET | Freq: Every day | ORAL | Status: DC | PRN
  Administered 2015-11-26: 5 mg via ORAL
  Filled 2015-11-25: qty 1

## 2015-11-25 MED ORDER — ONDANSETRON HCL 4 MG/2ML IJ SOLN: 4.0000 mg | Freq: Four times a day (QID) | INTRAMUSCULAR | Status: DC | PRN

## 2015-11-25 MED ORDER — ACETAMINOPHEN 325 MG PO TABS: 650.0000 mg | ORAL_TABLET | Freq: Four times a day (QID) | ORAL | Status: DC | PRN

## 2015-11-25 MED ORDER — ENOXAPARIN SODIUM 40 MG/0.4ML ~~LOC~~ SOLN
30.0000 mg | SUBCUTANEOUS | Status: DC
  Administered 2015-11-25: 21:00:00 30 mg via SUBCUTANEOUS
  Filled 2015-11-25: qty 0.4

## 2015-11-25 NOTE — Progress Notes (Signed)
MEDICATION RELATED CONSULT NOTE - Electrolytes   Pharmacy consulted for electrolyte management for 82 yo female admitted failure to thrive.   Plan:   Will order potassium 40mEq PO x 1 and magnesium 2g IV x 1. Will recheck electrolytes with am labs.   Allergies  Allergen Reactions  . Ace Inhibitors Other (See Comments)    Reaction: ace inhibitors cause inc K-  . Ativan [Lorazepam] Other (See Comments)  . Calcium Nausea Only  . Donepezil Hydrochloride Other (See Comments)    Reaction: MS change  . Latex Itching and Other (See Comments)    Reaction: Red skin  . Memantine Other (See Comments)    Reaction: MS change  . Oxycodone-Aspirin Other (See Comments)    Reaction: Unknown  . Penicillins Other (See Comments)    Reaction: Unknown  . Pregabalin Nausea And Vomiting  . Rofecoxib Other (See Comments)    Reaction: Renal failure  . Sulfonamide Derivatives Other (See Comments)    Reaction: Unknown    Patient Measurements: Height: 4\' 7"  (139.7 cm) Weight: 115 lb 14.4 oz (52.6 kg) IBW/kg (Calculated) : 34  Vital Signs: Temp: 98.3 F (36.8 C) (10/04 0740) Temp Source: Oral (10/04 0740) BP: 112/84 (10/04 1006) Pulse Rate: 63 (10/04 1006) Intake/Output from previous day: 10/03 0701 - 10/04 0700 In: 130 [I.V.:130] Out: -  Intake/Output from this shift: Total I/O In: 240 [P.O.:240] Out: -   Labs:  Recent Labs  11/24/15 1749 11/25/15 0349  WBC 7.4 7.3  HGB 14.2 12.7  HCT 41.2 36.9  PLT 259 243  CREATININE 1.49* 1.35*  MG  --  1.6*  PHOS  --  3.0  ALBUMIN 4.1  --   PROT 7.0  --   AST 19  --   ALT 10*  --   ALKPHOS 91  --   BILITOT 0.4  --    Estimated Creatinine Clearance: 17 mL/min (by C-G formula based on SCr of 1.35 mg/dL (H)).   Microbiology: Recent Results (from the past 720 hour(s))  Urine culture     Status: None   Collection Time: 11/11/15  4:17 PM  Result Value Ref Range Status   Specimen Description URINE, RANDOM  Final   Special Requests NONE   Final   Culture NO GROWTH [facility]   Final   Report Status 11/13/2015 FINAL  Final    Medical History: Past Medical History:  Diagnosis Date  . Abnormal pancreatic function study    elevated enzymes  . Backache, unspecified   . Carpal tunnel syndrome   . Chronic pain syndrome   . Disorder of bone and cartilage, unspecified   . Edema   . Esophageal reflux   . Gout, unspecified   . History of transfusion of whole blood   . Hyperpotassemia   . Jaundice, unspecified, not of newborn   . Memory loss   . Mononeuritis of unspecified site   . Osteoarthrosis, unspecified whether generalized or localized, unspecified site   . Other B-complex deficiencies   . Other chronic pain   . Other malaise and fatigue   . Pain in limb   . Peptic ulcer, unspecified site, unspecified as acute or chronic, without mention of hemorrhage, perforation, or obstruction   . Pure hypercholesterolemia   . Renal failure, unspecified    secondary to Vioxx  . Type II or unspecified type diabetes mellitus without mention of complication, not stated as uncontrolled   . Unspecified adverse effect of unspecified drug, medicinal  and biological substance   . Unspecified essential hypertension   . Unspecified hypothyroidism   . Unspecified vitamin D deficiency    Pharmacy will continue to monitor and adjust per consult.   Sara Lang,Sara Lang 11/25/2015,2:05 PM

## 2015-11-25 NOTE — Evaluation (Signed)
Physical Therapy Evaluation Patient Details Name: Sara Lang MRN: [id number] DOB: 11/26/1920 Today's Date: 11/25/2015   History of Present Illness  presented to ER secondary to decreased PO intake, increased fatigue/lethargy; admitted for management of dehydration.  Clinical Impression  Upon evaluation, patient alert and oriented to self only; pleasant and cooperative, but generally confused to location/situation and memory/recall.  Son present to assist with all social history and PLOF.  Patient eager for OOB activities, but reports dizziness with progressive transition to upright (appears to resolve with accommodation to position). Vitals stable and WFL; no objective orthostasis noted.  Able to complete bed mobility, sit/stand, basic transfers and gait (100') with RW, min assist.  Reciprocal stepping pattern noted; slow and cautious without overt buckling or LOB.  Additional distance limited by fatigue (per family, does not mobilize much greater distances at baseline). Would benefit from skilled PT to address above deficits and promote optimal return to PLOF; Recommend transition to HHPT upon discharge from acute hospitalization.     Follow Up Recommendations Home health PT;Supervision/Assistance - 24 hour    Equipment Recommendations  None recommended by PT    Recommendations for Other Services       Precautions / Restrictions Precautions Precautions: Fall Restrictions Weight Bearing Restrictions: No      Mobility  Bed Mobility Overal bed mobility: Needs Assistance Bed Mobility: Supine to Sit     Supine to sit: Min guard     General bed mobility comments: use of bedrails for assist as needed  Transfers Overall transfer level: Needs assistance Equipment used: Rolling walker (2 wheeled) Transfers: Sit to/from Stand Sit to Stand: Min assist         General transfer comment: cuing for hand/foot placement; reports of dizziness with transition to upright--vitals stable  and WFL  Ambulation/Gait Ambulation/Gait assistance: Min assist Ambulation Distance (Feet): 100 Feet Assistive device: Rolling walker (2 wheeled)       General Gait Details: reciprocal stepping pattern; mild L lateral sway at times, but no overt buckling or LOB.  Slow and purposeful with all mobility; decreased reaction time and ability to respond to (unexpected) external perturbation.  Limited insight into deficits/need for assist.  Stairs            Wheelchair Mobility    Modified Rankin (Stroke Patients Only)       Balance Overall balance assessment: Needs assistance Sitting-balance support: No upper extremity supported;Feet supported Sitting balance-Leahy Scale: Good     Standing balance support: No upper extremity supported Standing balance-Leahy Scale: Fair                               Pertinent Vitals/Pain Pain Assessment: No/denies pain    Home Living Family/patient expects to be discharged to:: Private residence Living Arrangements: Children Available Help at Discharge: Family;Available 24 hours/day Type of Home: House Home Access: Stairs to enter Entrance Stairs-Rails: None (son assists) Secretary/administratorntrance Stairs-Number of Steps: 3 Home Layout: Two level;Bed/bath upstairs (no essential needs on upper level of home) Home Equipment: Walker - standard;Transport chair      Prior Function           Comments: Per son, utilizes quad cane vs SW and +1 sup/assist for all mobility; sponge bathes for ADLs     Hand Dominance        Extremity/Trunk Assessment   Upper Extremity Assessment: Overall WFL for tasks assessed  Lower Extremity Assessment: Generalized weakness (grossly at least 4/5 throughout)         Communication   Communication: No difficulties  Cognition Arousal/Alertness: Awake/alert Behavior During Therapy: WFL for tasks assessed/performed Overall Cognitive Status: History of cognitive impairments - at baseline  (oriented to self only; pleasant and cooperative, but generally confused to all information)                      General Comments      Exercises     Assessment/Plan    PT Assessment Patient needs continued PT services  PT Problem List Decreased strength;Decreased activity tolerance;Decreased balance;Decreased mobility;Decreased cognition;Decreased knowledge of use of DME;Decreased safety awareness;Decreased knowledge of precautions          PT Treatment Interventions Gait training;DME instruction;Functional mobility training;Stair training;Therapeutic activities;Therapeutic exercise;Patient/family education;Balance training    PT Goals (Current goals can be found in the Care Plan section)  Acute Rehab PT Goals Patient Stated Goal: to go home PT Goal Formulation: With patient Time For Goal Achievement: January 02, 2016 Potential to Achieve Goals: Fair    Frequency Min 2X/week   Barriers to discharge        Co-evaluation               End of Session Equipment Utilized During Treatment: Gait belt Activity Tolerance: Patient tolerated treatment well Patient left: in bed;with call bell/phone within reach;with bed alarm set;with family/visitor present Nurse Communication: Mobility status         Time: 1610-9604 PT Time Calculation (min) (ACUTE ONLY): 29 min   Charges:   PT Evaluation $PT Eval Low Complexity: 1 Procedure PT Treatments $Therapeutic Activity: 8-22 mins   PT G Codes:        Electa Sterry H. Manson Passey, PT, DPT, NCS 11/25/15, 4:09 PM [phone]

## 2015-11-25 NOTE — H&P (Signed)
[facility] - [place] @ [facility] Admission History and Physical [employer], D.O.  ---------------------------------------------------------------------------------------------------------------------   PATIENT NAME: Sara Lang MR#: [id number] DATE OF BIRTH: April 14, 1920 DATE OF ADMISSION: 11/24/2015 PRIMARY CARE PHYSICIAN: No PCP Per Patient  REQUESTING/REFERRING PHYSICIAN: ED Dr. Darnelle Catalan  CHIEF COMPLAINT: Chief Complaint  Patient presents with  . Dizziness    HISTORY OF PRESENT ILLNESS: History obtained from patient's sons and ED / hospital records secondary to patient's dementia.   Cynthia Cogle is a 82 y.o. female with a known history of Alzheimers dementia presents to the emergency department complaining of dehydration.  Patient was admitted for seizure-like activity with syncope and discharged home for outpatient follow-up 2 weeks ago. Patient's sons are her primary caregivers and note that since her discharge from the hospital she has had decreased by mouth intake, increased fatigue, sleeping up to 20 hours per day. They note constipation, poor skin turgor and decreased energy. This is her third hospital visit in the past few weeks. Patient's family is requesting palliative care consultation considering her progressive decline in status  Patient is reportedly semi-independent with her activities of daily living. Otherwise there has been no change in status. Patient has been taking medication as prescribed and there has been no recent change in medication or diet.  There has been no recent illness, travel or sick contacts.    Patient offers no complaints..   EMS/ED COURSE:   Patient received IV normal saline.  PAST MEDICAL HISTORY: Past Medical History:  Diagnosis Date  . Abnormal pancreatic function study    elevated enzymes  . Backache, unspecified   . Carpal tunnel syndrome   . Chronic pain syndrome   . Disorder of bone and cartilage, unspecified   . Edema   .  Esophageal reflux   . Gout, unspecified   . History of transfusion of whole blood   . Hyperpotassemia   . Jaundice, unspecified, not of newborn   . Memory loss   . Mononeuritis of unspecified site   . Osteoarthrosis, unspecified whether generalized or localized, unspecified site   . Other B-complex deficiencies   . Other chronic pain   . Other malaise and fatigue   . Pain in limb   . Peptic ulcer, unspecified site, unspecified as acute or chronic, without mention of hemorrhage, perforation, or obstruction   . Pure hypercholesterolemia   . Renal failure, unspecified    secondary to Vioxx  . Type II or unspecified type diabetes mellitus without mention of complication, not stated as uncontrolled   . Unspecified adverse effect of unspecified drug, medicinal and biological substance   . Unspecified essential hypertension   . Unspecified hypothyroidism   . Unspecified vitamin D deficiency       PAST SURGICAL HISTORY: Past Surgical History:  Procedure Laterality Date  . APPENDECTOMY  1928  . CATARACT EXTRACTION    . CHOLECYSTECTOMY  1956  . ESOPHAGOGASTRODUODENOSCOPY     stricture  . THYROIDECTOMY     throat surgery-cold nodule  . TOE AMPUTATION     x2 Hammer toes  . TOE AMPUTATION    . toe nail procedure    . TONSILLECTOMY        SOCIAL HISTORY: Social History  Substance Use Topics  . Smoking status: Never Smoker  . Smokeless tobacco: Never Used  . Alcohol use No      FAMILY HISTORY: Family History  Problem Relation Age of Onset  . Hyperlipidemia Father   . Arthritis Father   .  Transient ischemic attack Son   . Depression Son   . Arthritis Mother   . Arthritis      Grandparents     MEDICATIONS AT HOME: Prior to Admission medications   Medication Sig Start Date End Date Taking? Authorizing Provider  amLODipine (NORVASC) 5 MG tablet Take 1 tablet (5 mg total) by mouth every 12 (twelve) hours. Patient taking differently: Take 5 mg by mouth daily.  11/12/15   Yes Enedina Finner, MD  diphenoxylate-atropine (LOMOTIL) 2.5-0.025 MG tablet Take 1 tablet by mouth 4 (four) times daily as needed for diarrhea or loose stools. 11/09/15 11/08/16 Yes Emily Filbert, MD  food thickener (THICK IT) POWD Take 1 Container by mouth as needed.    Yes Historical Provider, MD  furosemide (LASIX) 20 MG tablet Take 1 tablet (20 mg total) by mouth as needed. If pt has leg edema and sob 11/12/15  Yes Enedina Finner, MD  insulin lispro (HUMALOG KWIKPEN) 100 UNIT/ML KiwkPen Inject under skin up to 8 units 3x a day 15 min before a meal Patient taking differently: Inject 8 Units into the skin 3 (three) times daily. Inject under skin up to 8 units 3x a day 15 min before a meal 05/22/13  Yes Carlus Pavlov, MD  Insulin Syringe-Needle U-100 (B-D INS SYR ULTRAFINE .5CC/30G) 30G X 1/2" 0.5 ML MISC Use as directed 02/15/13  Yes Judy Pimple, MD  levothyroxine (SYNTHROID, LEVOTHROID) 88 MCG tablet TAKE 1 TABLET BY MOUTH DAILY 08/05/15  Yes Marne A Tower, MD  lidocaine (LIDODERM) 5 % Place 1 patch onto the skin daily. Remove & Discard patch within 12 hours or as directed by MD    Yes Historical Provider, MD  omeprazole (PRILOSEC) 20 MG capsule TAKE ONE CAPSULE BY MOUTH DAILY 04/02/15  Yes Judy Pimple, MD  potassium chloride (K-DUR,KLOR-CON) 10 MEQ tablet Take 1 tablet (10 mEq total) by mouth as needed. When you use lasix 11/12/15  Yes Enedina Finner, MD  rivastigmine (EXELON) 4.6 mg/24hr APPLY ONE PATCH TO A HAIRLESS AREA ON THE SKIN ONCE DAILY AS DIRECTED. REMOVE OLD PATCH BEFORE APPLYING NEW ONE. 11/09/15  Yes Judy Pimple, MD  Vitamin D, Ergocalciferol, (DRISDOL) 50000 UNITS CAPS capsule Take 50,000 Units by mouth every 7 (seven) days.   Yes Historical Provider, MD      DRUG ALLERGIES: Allergies  Allergen Reactions  . Ace Inhibitors Other (See Comments)    Reaction: ace inhibitors cause inc K-  . Ativan [Lorazepam] Other (See Comments)  . Calcium Nausea Only  . Donepezil Hydrochloride Other  (See Comments)    Reaction: MS change  . Latex Itching and Other (See Comments)    Reaction: Red skin  . Memantine Other (See Comments)    Reaction: MS change  . Oxycodone-Aspirin Other (See Comments)    Reaction: Unknown  . Penicillins Other (See Comments)    Reaction: Unknown  . Pregabalin Nausea And Vomiting  . Rofecoxib Other (See Comments)    Reaction: Renal failure  . Sulfonamide Derivatives Other (See Comments)    Reaction: Unknown     REVIEW OF SYSTEMS: Unable to obtain secondary to patient's dementia  PHYSICAL EXAMINATION: VITAL SIGNS: Blood pressure (!) 162/77, pulse 61, temperature 98 F (36.7 C), temperature source Oral, resp. rate 16, height 4\' 10"  (1.473 m), weight 52.6 kg (116 lb), SpO2 96 %.  GENERAL: 82 y.o.-year-old white female patient, well-developed, well-nourished lying in the bed in no acute distress.  Pleasantly confused HEENT: Head atraumatic, normocephalic. Pupils  equal, round, reactive to light and accommodation. No scleral icterus. Extraocular muscles intact. Oropharynx is clear. Mucus membranes dry. NECK: Supple, no bruits heard CHEST: Normal breath sounds bilaterally. No wheezing, rales, rhonchi or crackles. No use of accessory muscles of respiration.  No reproducible chest wall tenderness.  CARDIOVASCULAR: S1, S2 normal. No murmurs, rubs, or gallops appreciated. Cap refill <2 seconds. ABDOMEN: Soft, mildly distended, positive left lower quadrant tenderness to palpation No rebound, guarding, rigidity. Normoactive bowel sounds present in all four quadrants. No organomegaly or mass. EXTREMITIES: Full range of motion. No pedal edema, cyanosis, or clubbing. NEUROLOGIC: Cranial nerves II through XII are grossly intact with no focal sensorimotor deficit. Muscle strength 5/5 in all extremities. Sensation intact. Gait not checked. PSYCHIATRIC: The patient is alert and oriented x 1. Normal affect, mood, thought content. SKIN: Warm, dry, and intact without  obvious rash, lesion, or ulcer.  LABORATORY PANEL:  CBC  Recent Labs Lab 11/24/15 1749  WBC 7.4  HGB 14.2  HCT 41.2  PLT 259   ----------------------------------------------------------------------------------------------------------------- Chemistries  Recent Labs Lab 11/24/15 1749  NA 135  K 3.6  CL 97*  CO2 29  GLUCOSE 185*  BUN 31*  CREATININE 1.49*  CALCIUM 9.2  AST 19  ALT 10*  ALKPHOS 91  BILITOT 0.4   ------------------------------------------------------------------------------------------------------------------ Cardiac Enzymes  Recent Labs Lab 11/24/15 1749  TROPONINI <0.03   ------------------------------------------------------------------------------------------------------------------  RADIOLOGY: Ct Abdomen Pelvis Wo Contrast  Addendum Date: 11/24/2015   ADDENDUM REPORT: 11/24/2015 23:13 ADDENDUM: Please note there is slight induration of the subcutaneous soft tissues of the perirectal region with slight thickened appearance of the perianal soft tissues. No drainable fluid collection or abscess identified. Correlation with clinical exam is recommended. Electronically Signed   By: Elgie Collard M.D.   On: 11/24/2015 23:13   Result Date: 11/24/2015 CLINICAL DATA:  82 year old female with constipation. EXAM: CT ABDOMEN AND PELVIS WITHOUT CONTRAST TECHNIQUE: Multidetector CT imaging of the abdomen and pelvis was performed following the standard protocol without IV contrast. COMPARISON:  CT of the abdomen pelvis 11/25/2002 and MRI dated 03/06/2003 FINDINGS: Evaluation of this exam is limited in the absence of intravenous contrast. Lower chest: Stable 4 mm left lung base nodule. The visualized lung bases are otherwise clear. There is coronary vascular calcification. No intra-abdominal free air or free fluid. Hepatobiliary: The liver appears unremarkable. No intrahepatic biliary ductal dilatation. Cholecystectomy. Pancreas: Unremarkable. No pancreatic ductal  dilatation or surrounding inflammatory changes. Spleen: Normal in size without focal abnormality. Adrenals/Urinary Tract: There adrenal glands appear unremarkable. There is moderate left renal atrophy and cortical thinning. There is no hydronephrosis or nephrolithiasis on either side. The visualized ureters and urinary bladder appear unremarkable. Stomach/Bowel: There is a moderate size hiatal hernia containing portion of the stomach. There is no evidence of gastric outlet or small bowel obstruction or active inflammation. Moderate stool noted throughout the colon. Appendectomy. Vascular/Lymphatic: There is moderate aortoiliac atherosclerotic disease. The aorta is tortuous. Evaluation of the aorta and IVC is limited on this noncontrast study. No portal venous gas identified. There is no adenopathy. Reproductive: The uterus and the left ovary appear unremarkable. There is a small right inguinal hernia. A 2.0 x 2.5 cm low attenuating structure noted within the right inguinal hernia which appears to be extension of the uterine ligament and likely represents the right ovary. Further evaluation with ultrasound is recommended. No definite inflammatory changes or fluid noted surrounding this structure in the inguinal canal. Other: None Musculoskeletal: Osteopenia with extensive degenerative changes of the  spine as well as multilevel disc desiccation with vacuum phenomena. No definite acute fracture. IMPRESSION: Moderate stool throughout the colon. No evidence of bowel obstruction or active inflammation. Moderate size hiatal hernia containing portion of the stomach. Small right inguinal hernia likely containing the right ovary. No definite associated inflammatory changes. Further evaluation with ultrasound recommended. Electronically Signed: By: Elgie CollardArash  Radparvar M.D. On: 11/24/2015 22:12   Dg Chest Portable 1 View  Result Date: 11/24/2015 CLINICAL DATA:  Pt brought in by family with dizziness, low oxygen saturations,  and fsbs changes. Last BM was 3 days ago. Pt reports sob. Pt alert. EXAM: PORTABLE CHEST 1 VIEW COMPARISON:  11/11/2015 FINDINGS: There is no focal parenchymal opacity. There is no pleural effusion or pneumothorax. The heart and mediastinal contours are unremarkable. There is a large hiatal hernia. The osseous structures are unremarkable. IMPRESSION: No active disease. Electronically Signed   By: Elige KoHetal  Patel   On: 11/24/2015 18:14    EKG: Normal sinus rhythm at 60 bpm with normal axis and nonspecific ST-T wave changes.   IMPRESSION AND PLAN:  This is a 82 y.o. female with a history of Alzheimer's dementia now being admitted with:  1. Failure to thrive-given the patient's recent progressive decline in functional status and the recurrent need for IV fluid hydration secondary to decreased by mouth intake the patient's family has requested hospice and palliative care evaluation so the patient can be made comfortable and resume care at home. I will admit her for IV fluid hydration, continue her regular home medications which have been reviewed and verified by her son. She is given a heart healthy and diabetic diet with Accu-Cheks before meals and at bedtime and regular insulin sliding scale coverage. She has a DO NOT RESUSCITATE and a MOLST form indicating that she would not want life-sustaining measures. All new medications are to be discussed with and approved by her son who is her healthcare proxy and power of attorney. 2. Dehydration secondary to #1-IV fluid hydration 3. Diarrhea and Constipation-fluid hydration and Lomotil or stool softeners/enemas as needed 4. Hypothyroidism-continue Synthroid 5. Hypertension-continue Norvasc 6. GERD-continue Protonix in place of Prilosec 7. Dependent edema-continue HCTZ as needed for lower, swelling. 8. Diabetes-regular insulin sliding scale coverage before meals and at bedtime   All the records are reviewed and case discussed with ED provider. Management  plans discussed with the patient and/or family who express understanding and agree with plan of care.   TOTAL TIME TAKING CARE OF THIS PATIENT: 75 minutes.   Jahlil Ziller D.O. on 11/25/2015 at 12:41 AM Between 7am to 6pm - Pager - [phone] After 6pm go to [email or web address] - [email or web address] [email or web address] [facility] [place] Hospitalists Office [phone] CC: Primary care physician; No PCP Per Patient     Note: This dictation was prepared with Dragon dictation along with smaller phrase technology. Any transcriptional errors that result from this process are unintentional.

## 2015-11-25 NOTE — Progress Notes (Signed)
CSW has received consult for SNF placement. CSW needs a PT consult to determine appropriate level of care for patient. CSW will continue to follow and assist.  Woodroe Modehristina Orland Visconti, MSW, LCSW, LCAS-A Clinical Social Worker [phone]

## 2015-11-25 NOTE — Care Management (Signed)
Physical therapy evaluation completed. Recommended home with home health. Discussed agencies with both sons. Would like [facility] when discharged. Gwenette GreetBrenda S Shayla Heming RN MSN CCM Care Management [phone]

## 2015-11-25 NOTE — Progress Notes (Signed)
PT recommended HH for patient. CSW informed RNCM. CSW is signing off but is available if a need were to arise.  Woodroe Modehristina Jolyn Deshmukh, MSW, LCSW, LCAS-A Clinical Social Worker [phone]

## 2015-11-25 NOTE — Care Management Important Message (Signed)
Important Message  Patient Details  Name: Sara Lang MRN: [id number] Date of Birth: 07/23/1920   Medicare Important Message Given:  Yes    Gwenette GreetBrenda S Rochanda Harpham, RN 11/25/2015, 8:37 AM

## 2015-11-25 NOTE — Progress Notes (Addendum)
New referral for [facility] services at home following [facility]. Sara Lang is a 82 year old woman with a history of DM II, Alzheimer's dementia, esophageal reflux, Gout and hypothyroidism. [facility] 10[facility] for treatment of dehydration. Of note she has had hospice services in the past and was most recently evaluated in her home in June of 2017. Writer reviewed her chart and also spoke with [name]. She is able to carry on a  conversation, feed herself and has been able to work with physical therapy. At this time it does not appear that she is appropriate for hospice services based on dementia, her albumin is 4.5, and again she is eating well. Writer advised both CMRN Terrilee CroakBrenda Holland and attending physician Dr. Elisabeth PigeonVachhani that at this time patient is not appropriate for hospice services. Writer also spoke at length to her son [name] via telephone and explained all this. He voiced understanding. He was agreeable to a HOME Palliative referral. Per [name] patient does not currently have a PCP, [facility], he plans on having patient's cardiologist Dr. Lady GaryFath recommend a PCP and plans to contact his office tomorrow 10/5. Thank you. Dayna BarkerKaren Robertson RN, BSN, [facility] [place], [facility] [phone] c

## 2015-11-25 NOTE — Care Management (Signed)
Admitted to [facility] with the diagnosis of dehydration. Discharged from this facility 11/12/15. Lives with 2 sons Dominenic [phone]) and [name] [phone]). Last seen Dr. Milinda Antisower 6 months ago. [facility] and Hospice services in the past. Home oxygen through [facility]. No skilled facility. Sons help with basic needs. Standard walker and wheelchair in the home. Last fall was 11/10/15. [facility] [place].  Palliative Care referral pending Gwenette GreetBrenda S Idalia Allbritton RN MSN CCM Care Management [phone]

## 2015-11-26 LAB — BASIC METABOLIC PANEL
Anion gap: 5 (ref 5–15)
BUN: 26 mg/dL — AB (ref 6–20)
CHLORIDE: 110 mmol/L (ref 101–111)
CO2: 26 mmol/L (ref 22–32)
Calcium: 8.3 mg/dL — ABNORMAL LOW (ref 8.9–10.3)
Creatinine, Ser: 1.34 mg/dL — ABNORMAL HIGH (ref 0.44–1.00)
GFR calc Af Amer: 38 mL/min — ABNORMAL LOW (ref >60)
GFR calc non Af Amer: 33 mL/min — ABNORMAL LOW (ref >60)
Glucose, Bld: 162 mg/dL — ABNORMAL HIGH (ref 65–99)
POTASSIUM: 4 mmol/L (ref 3.5–5.1)
SODIUM: 141 mmol/L (ref 135–145)

## 2015-11-26 LAB — GLUCOSE, CAPILLARY
GLUCOSE-CAPILLARY: 158 mg/dL — AB (ref 65–99)
Glucose-Capillary: 212 mg/dL — ABNORMAL HIGH (ref 65–99)

## 2015-11-26 LAB — MAGNESIUM: MAGNESIUM: 2.2 mg/dL (ref 1.7–2.4)

## 2015-11-26 MED ORDER — BISACODYL 10 MG RE SUPP
10.0000 mg | Freq: Once | RECTAL | Status: AC
  Administered 2015-11-26: 11:00:00 10 mg via RECTAL
  Filled 2015-11-26: qty 1

## 2015-11-26 MED ORDER — BISACODYL 5 MG PO TBEC: 5.0000 mg | DELAYED_RELEASE_TABLET | Freq: Every day | ORAL | 0 refills | Status: AC | PRN

## 2015-11-26 NOTE — Progress Notes (Signed)
MEDICATION RELATED CONSULT NOTE - Electrolytes   Pharmacy consulted for electrolyte management for 82 yo female admitted failure to thrive. Patient received potassium PO x 1 and magnesium 2g IV x1 on 10/4.   Plan:   No replacement warranted at this time. Will recheck electrolytes with am labs on 10/7.   Allergies  Allergen Reactions  . Ace Inhibitors Other (See Comments)    Reaction: ace inhibitors cause inc K-  . Ativan [Lorazepam] Other (See Comments)  . Calcium Nausea Only  . Donepezil Hydrochloride Other (See Comments)    Reaction: MS change  . Latex Itching and Other (See Comments)    Reaction: Red skin  . Memantine Other (See Comments)    Reaction: MS change  . Oxycodone-Aspirin Other (See Comments)    Reaction: Unknown  . Penicillins Other (See Comments)    Reaction: Unknown  . Pregabalin Nausea And Vomiting  . Rofecoxib Other (See Comments)    Reaction: Renal failure  . Sulfonamide Derivatives Other (See Comments)    Reaction: Unknown    Patient Measurements: Height: 4\' 7"  (139.7 cm) Weight: 115 lb 14.4 oz (52.6 kg) IBW/kg (Calculated) : 34  Vital Signs: Temp: 98.6 F (37 C) (10/05 0445) Temp Source: Axillary (10/05 0445) BP: 135/63 (10/05 0445) Pulse Rate: 58 (10/05 0445) Intake/Output from previous day: 10/04 0701 - 10/05 0700 In: 720 [P.O.:720] Out: -  Intake/Output from this shift: Total I/O In: 120 [P.O.:120] Out: 2 [Stool:2]  Labs:  Recent Labs  11/24/15 1749 11/25/15 0349 11/26/15 0421  WBC 7.4 7.3  --   HGB 14.2 12.7  --   HCT 41.2 36.9  --   PLT 259 243  --   CREATININE 1.49* 1.35* 1.34*  MG  --  1.6* 2.2  PHOS  --  3.0  --   ALBUMIN 4.1  --   --   PROT 7.0  --   --   AST 19  --   --   ALT 10*  --   --   ALKPHOS 91  --   --   BILITOT 0.4  --   --    Estimated Creatinine Clearance: 17.1 mL/min (by C-G formula based on SCr of 1.34 mg/dL (H)).   Microbiology: Recent Results (from the past 720 hour(s))  Urine culture      Status: None   Collection Time: 11/11/15  4:17 PM  Result Value Ref Range Status   Specimen Description URINE, RANDOM  Final   Special Requests NONE  Final   Culture NO GROWTH Performed at [facility]   Final   Report Status 11/13/2015 FINAL  Final    Medical History: Past Medical History:  Diagnosis Date  . Abnormal pancreatic function study    elevated enzymes  . Backache, unspecified   . Carpal tunnel syndrome   . Chronic pain syndrome   . Disorder of bone and cartilage, unspecified   . Edema   . Esophageal reflux   . Gout, unspecified   . History of transfusion of whole blood   . Hyperpotassemia   . Jaundice, unspecified, not of newborn   . Memory loss   . Mononeuritis of unspecified site   . Osteoarthrosis, unspecified whether generalized or localized, unspecified site   . Other B-complex deficiencies   . Other chronic pain   . Other malaise and fatigue   . Pain in limb   . Peptic ulcer, unspecified site, unspecified as acute or chronic, without mention of hemorrhage, perforation,  or obstruction   . Pure hypercholesterolemia   . Renal failure, unspecified    secondary to Vioxx  . Type II or unspecified type diabetes mellitus without mention of complication, not stated as uncontrolled   . Unspecified adverse effect of unspecified drug, medicinal and biological substance   . Unspecified essential hypertension   . Unspecified hypothyroidism   . Unspecified vitamin D deficiency    Pharmacy will continue to monitor and adjust per consult.   Solene Hereford L 11/26/2015,1:10 PM

## 2015-11-26 NOTE — Progress Notes (Signed)
Follow up on new referral for [facility] services at home following discharge. Writer met with patient's son Domenic and discussed his concerns regarding his mother not being appropriate for hospice services. With his permission, Probation officer faxed patient's hospital records to the [facility] Medical Director Dr. Marco Collie for assistance in finding a hospice diagnosis, this was successful. Plan is now for Mrs. Menter to discharge home with hospice services.Hospice diagnosis to be anorexia. Both sons aware and are in agreement. Patient's constipation has been addressed with both staff RN Dedra and attending physician Dr. Anselm Jungling. No DME needs at this time. All patient information faxed to hospice referral. Patient to discharge home via car following a successful bowel movement. Hospital care team aware. Thank you. Flo Shanks RN, BSN, [place] and [facility] [place], [facility] [phone] c

## 2015-11-26 NOTE — Progress Notes (Signed)
[facility] - [place] at [facility]   PATIENT NAME: Sara Lang    MR#:  [id number]  DATE OF BIRTH:  1920/08/19  SUBJECTIVE:  CHIEF COMPLAINT:   Chief Complaint  Patient presents with  . Dizziness     As per her son, she have dementia, and gradual worsening in condition.   Not eating or drinking much, she is staying sleepy for most of the time as per son.  REVIEW OF SYSTEMS:   Due to dementia, pt is not able to give any ROS.  ROS  DRUG ALLERGIES:   Allergies  Allergen Reactions  . Ace Inhibitors Other (See Comments)    Reaction: ace inhibitors cause inc K-  . Ativan [Lorazepam] Other (See Comments)  . Calcium Nausea Only  . Donepezil Hydrochloride Other (See Comments)    Reaction: MS change  . Latex Itching and Other (See Comments)    Reaction: Red skin  . Memantine Other (See Comments)    Reaction: MS change  . Oxycodone-Aspirin Other (See Comments)    Reaction: Unknown  . Penicillins Other (See Comments)    Reaction: Unknown  . Pregabalin Nausea And Vomiting  . Rofecoxib Other (See Comments)    Reaction: Renal failure  . Sulfonamide Derivatives Other (See Comments)    Reaction: Unknown    VITALS:  Blood pressure 135/63, pulse (!) 58, temperature 98.6 F (37 C), temperature source Axillary, resp. rate 20, height 4\' 7"  (1.397 m), weight 52.6 kg (115 lb 14.4 oz), SpO2 98 %.  PHYSICAL EXAMINATION:   GENERAL: 82 y.o.-year-old white female patient, well-developed, well-nourished lying in the bed in no acute distress.  Pleasantly confused HEENT: Head atraumatic, normocephalic. Pupils equal, round, reactive to light and accommodation. No scleral icterus. Extraocular muscles intact. Oropharynx is clear. Mucus membranes dry. NECK: Supple, no bruits heard CHEST: Normal breath sounds bilaterally. No wheezing, rales, rhonchi or crackles. No use of accessory muscles of respiration.  No reproducible chest wall tenderness.  CARDIOVASCULAR: S1, S2 normal. No  murmurs, rubs, or gallops appreciated. Cap refill <2 seconds. ABDOMEN: Soft, mildly distended, positive left lower quadrant tenderness to palpation No rebound, guarding, rigidity. Normoactive bowel sounds present in all four quadrants. No organomegaly or mass. EXTREMITIES: Full range of motion. No pedal edema, cyanosis, or clubbing. NEUROLOGIC: Cranial nerves II through XII are grossly intact with no focal sensorimotor deficit. Muscle strength 4/5 in all extremities. Sensation intact. Gait not checked. PSYCHIATRIC: The patient is alert and oriented x 1. Normal affect, not talking much, but smiling. SKIN: Warm, dry, and intact without obvious rash, lesion, or ulcer.   Physical Exam LABORATORY PANEL:   CBC  Recent Labs Lab 11/25/15 0349  WBC 7.3  HGB 12.7  HCT 36.9  PLT 243   ------------------------------------------------------------------------------------------------------------------  Chemistries   Recent Labs Lab 11/24/15 1749  11/26/15 0421  NA 135  < > 141  K 3.6  < > 4.0  CL 97*  < > 110  CO2 29  < > 26  GLUCOSE 185*  < > 162*  BUN 31*  < > 26*  CREATININE 1.49*  < > 1.34*  CALCIUM 9.2  < > 8.3*  MG  --   < > 2.2  AST 19  --   --   ALT 10*  --   --   ALKPHOS 91  --   --   BILITOT 0.4  --   --   < > = values in this interval not displayed. ------------------------------------------------------------------------------------------------------------------  Cardiac  Enzymes  Recent Labs Lab 11/24/15 1749  TROPONINI <0.03   ------------------------------------------------------------------------------------------------------------------  RADIOLOGY:  Ct Abdomen Pelvis Wo Contrast  Addendum Date: 11/24/2015   ADDENDUM REPORT: 11/24/2015 23:13 ADDENDUM: Please note there is slight induration of the subcutaneous soft tissues of the perirectal region with slight thickened appearance of the perianal soft tissues. No drainable fluid collection or abscess identified.  Correlation with clinical exam is recommended. Electronically Signed   By: Elgie Collard M.D.   On: 11/24/2015 23:13   Result Date: 11/24/2015 CLINICAL DATA:  82 year old female with constipation. EXAM: CT ABDOMEN AND PELVIS WITHOUT CONTRAST TECHNIQUE: Multidetector CT imaging of the abdomen and pelvis was performed following the standard protocol without IV contrast. COMPARISON:  CT of the abdomen pelvis 11/25/2002 and MRI dated 03/06/2003 FINDINGS: Evaluation of this exam is limited in the absence of intravenous contrast. Lower chest: Stable 4 mm left lung base nodule. The visualized lung bases are otherwise clear. There is coronary vascular calcification. No intra-abdominal free air or free fluid. Hepatobiliary: The liver appears unremarkable. No intrahepatic biliary ductal dilatation. Cholecystectomy. Pancreas: Unremarkable. No pancreatic ductal dilatation or surrounding inflammatory changes. Spleen: Normal in size without focal abnormality. Adrenals/Urinary Tract: There adrenal glands appear unremarkable. There is moderate left renal atrophy and cortical thinning. There is no hydronephrosis or nephrolithiasis on either side. The visualized ureters and urinary bladder appear unremarkable. Stomach/Bowel: There is a moderate size hiatal hernia containing portion of the stomach. There is no evidence of gastric outlet or small bowel obstruction or active inflammation. Moderate stool noted throughout the colon. Appendectomy. Vascular/Lymphatic: There is moderate aortoiliac atherosclerotic disease. The aorta is tortuous. Evaluation of the aorta and IVC is limited on this noncontrast study. No portal venous gas identified. There is no adenopathy. Reproductive: The uterus and the left ovary appear unremarkable. There is a small right inguinal hernia. A 2.0 x 2.5 cm low attenuating structure noted within the right inguinal hernia which appears to be extension of the uterine ligament and likely represents the right  ovary. Further evaluation with ultrasound is recommended. No definite inflammatory changes or fluid noted surrounding this structure in the inguinal canal. Other: None Musculoskeletal: Osteopenia with extensive degenerative changes of the spine as well as multilevel disc desiccation with vacuum phenomena. No definite acute fracture. IMPRESSION: Moderate stool throughout the colon. No evidence of bowel obstruction or active inflammation. Moderate size hiatal hernia containing portion of the stomach. Small right inguinal hernia likely containing the right ovary. No definite associated inflammatory changes. Further evaluation with ultrasound recommended. Electronically Signed: By: Elgie Collard M.D. On: 11/24/2015 22:12   US Pelvis Complete  Result Date: 11/25/2015 CLINICAL DATA:  Evaluate RIGHT ovary in inguinal canal. EXAM: TRANSABDOMINAL ULTRASOUND OF PELVIS TECHNIQUE: Transabdominal ultrasound examination of the pelvis was performed including evaluation of the uterus, ovaries, adnexal regions, and pelvic cul-de-sac. COMPARISON:  CT abdomen and pelvis November 24, 2015 at 2125 hours FINDINGS: Uterus Measurements: 6.8 x 2.5 x 3.3 cm. No fibroids or other mass visualized. Punctate echogenic calcifications as seen on today's CT. Endometrium Thickness: 2 mm.  No focal abnormality visualized. Right ovary Not sonographically identified, possibly obscured by bowel gas. Left ovary Measurements: 1.5 x 1 x 1.2 cm. Normal appearance/no adnexal mass. Other findings: No abnormal free fluid. Additional imaging of the RIGHT inguinal region failed to demonstrate mass. IMPRESSION: RIGHT ovary not sonographically identified on this transabdominal pelvic ultrasound. No identified RIGHT inguinal mass. Otherwise negative postmenopausal pelvic ultrasound. Electronically Signed   By: Michel Santee.D.  On: 11/25/2015 00:41   Dg Chest Portable 1 View  Result Date: 11/24/2015 CLINICAL DATA:  Pt brought in by family with  dizziness, low oxygen saturations, and fsbs changes. Last BM was 3 days ago. Pt reports sob. Pt alert. EXAM: PORTABLE CHEST 1 VIEW COMPARISON:  11/11/2015 FINDINGS: There is no focal parenchymal opacity. There is no pleural effusion or pneumothorax. The heart and mediastinal contours are unremarkable. There is a large hiatal hernia. The osseous structures are unremarkable. IMPRESSION: No active disease. Electronically Signed   By: Elige KoHetal  Patel   On: 11/24/2015 18:14    ASSESSMENT AND PLAN:   Active Problems:   Dehydration  1. Failure to thrive-given the patient's recent progressive decline in functional status and the recurrent need for IV fluid hydration secondary to decreased by mouth intake   Secondary to advanced dementia the patient's family has requested hospice and palliative care evaluation so the patient can be made comfortable and resume care at home.  for IV fluid hydration, continue her regular home medications which have been reviewed and verified by her son. She is given a heart healthy and diabetic diet with Accu-Cheks before meals and at bedtime and regular insulin sliding scale coverage.   She has a DO NOT RESUSCITATE and a MOLST form indicating that she would not want life-sustaining measures. All new medications are to be discussed with and approved by her son who is her healthcare proxy and power of attorney.  palliative care and hospice consults to help arrange help at home. 2. Dehydration secondary to #1-IV fluid hydration 3. Diarrhea and Constipation-fluid hydration and Lomotil or stool softeners/enemas as needed 4. Hypothyroidism-continue Synthroid 5. Hypertension-continue Norvasc 6. GERD-continue Protonix in place of Prilosec 7. Dependent edema-continue HCTZ as needed for lower, swelling. 8. Diabetes-regular insulin sliding scale coverage before meals and at bedtime    All the records are reviewed and case discussed with Care Management/Social Workerr. Management  plans discussed with the patient, family and they are in agreement.  CODE STATUS: DNR  TOTAL TIME TAKING CARE OF THIS PATIENT: 40 minutes.   Discussed with her 2 son in room.  POSSIBLE D/C IN 1-2 DAYS, DEPENDING ON CLINICAL CONDITION.   Altamese DillingVACHHANI, Nahjae Hoeg M.D on 11/26/2015   Between 7am to 6pm - Pager - [phone]  After 6pm go to [email or web address] - Social research officer, governmentpassword [email or web address]  [facility]  Office  [phone]  CC: Primary care physician; No PCP Per Patient  Note: This dictation was prepared with Dragon dictation along with smaller phrase technology. Any transcriptional errors that result from this process are unintentional.

## 2015-11-26 NOTE — Discharge Instructions (Signed)
Followed by Hospice at home.

## 2015-11-26 NOTE — Progress Notes (Signed)
Patient discharged home with hospice care. All discharge instructions instructions given and all questions answered.

## 2015-11-29 NOTE — Discharge Summary (Signed)
[facility] - [place] at [facility]   PATIENT NAME: Sara Lang    MR#:  [id number]  DATE OF BIRTH:  Jul 04, 1920  DATE OF ADMISSION:  11/24/2015 ADMITTING PHYSICIAN: Jon Gills Hugelmeyer, DO  DATE OF DISCHARGE: 11/26/2015  1:05 PM  PRIMARY CARE PHYSICIAN: No PCP Per Patient    ADMISSION DIAGNOSIS:  Dehydration [E86.0]  DISCHARGE DIAGNOSIS:  Active Problems:   Dehydration   SECONDARY DIAGNOSIS:   Past Medical History:  Diagnosis Date  . Abnormal pancreatic function study    elevated enzymes  . Backache, unspecified   . Carpal tunnel syndrome   . Chronic pain syndrome   . Disorder of bone and cartilage, unspecified   . Edema   . Esophageal reflux   . Gout, unspecified   . History of transfusion of whole blood   . Hyperpotassemia   . Jaundice, unspecified, not of newborn   . Memory loss   . Mononeuritis of unspecified site   . Osteoarthrosis, unspecified whether generalized or localized, unspecified site   . Other B-complex deficiencies   . Other chronic pain   . Other malaise and fatigue   . Pain in limb   . Peptic ulcer, unspecified site, unspecified as acute or chronic, without mention of hemorrhage, perforation, or obstruction   . Pure hypercholesterolemia   . Renal failure, unspecified    secondary to Vioxx  . Type II or unspecified type diabetes mellitus without mention of complication, not stated as uncontrolled   . Unspecified adverse effect of unspecified drug, medicinal and biological substance   . Unspecified essential hypertension   . Unspecified hypothyroidism   . Unspecified vitamin D deficiency     HOSPITAL COURSE:   1. Failure to thrive-given the patient's recent progressive decline in functional status and the recurrent need for IV fluid hydration secondary to decreased by mouth intake   Secondary to advanced dementia the patient's family has requested hospice and palliative care evaluation so the patient can be made  comfortable and resume care at home.  for IV fluid hydration, continue her regular home medications which have been reviewed and verified by her son. She is given a heart healthy and diabetic diet with Accu-Cheks before meals and at bedtime and regular insulin sliding scale coverage.   She has a DO NOT RESUSCITATE and a MOLST form indicating that she would not want life-sustaining measures. All new medications are to bediscussed with and approved by her son who is her healthcare proxy and power of attorney.  palliative care and hospice consults to help arrange help at home. 2. Dehydration secondary to #1-IV fluid hydration 3. Diarrhea and Constipation-fluid hydration and Lomotil or stool softeners/enemas as needed 4. Hypothyroidism-continue Synthroid 5. Hypertension-continue Norvasc 6. GERD-continue Protonix in place of Prilosec 7. Dependent edema-continue HCTZ as needed for lower, swelling. 8. Diabetes-regular insulin sliding scale coverage before meals and at bedtime   DISCHARGE CONDITIONS:   Stable.  CONSULTS OBTAINED:    DRUG ALLERGIES:   Allergies  Allergen Reactions  . Ace Inhibitors Other (See Comments)    Reaction: ace inhibitors cause inc K-  . Ativan [Lorazepam] Other (See Comments)  . Calcium Nausea Only  . Donepezil Hydrochloride Other (See Comments)    Reaction: MS change  . Latex Itching and Other (See Comments)    Reaction: Red skin  . Memantine Other (See Comments)    Reaction: MS change  . Oxycodone-Aspirin Other (See Comments)    Reaction: Unknown  . Penicillins Other (See Comments)  Reaction: Unknown  . Pregabalin Nausea And Vomiting  . Rofecoxib Other (See Comments)    Reaction: Renal failure  . Sulfonamide Derivatives Other (See Comments)    Reaction: Unknown    DISCHARGE MEDICATIONS:   Discharge Medication List as of 11/26/2015 11:56 AM    START taking these medications   Details  bisacodyl (DULCOLAX) 5 MG EC tablet Take 1 tablet (5 mg  total) by mouth daily as needed for moderate constipation., Starting Thu 11/26/2015, Normal      CONTINUE these medications which have NOT CHANGED   Details  amLODipine (NORVASC) 5 MG tablet Take 1 tablet (5 mg total) by mouth every 12 (twelve) hours., Starting Thu 11/12/2015, Normal    diphenoxylate-atropine (LOMOTIL) 2.5-0.025 MG tablet Take 1 tablet by mouth 4 (four) times daily as needed for diarrhea or loose stools., Starting Mon 11/09/2015, Until Tue 11/08/2016, Print    food thickener (THICK IT) POWD Take 1 Container by mouth as needed. , Historical Med    furosemide (LASIX) 20 MG tablet Take 1 tablet (20 mg total) by mouth as needed. If pt has leg edema and sob, Starting Thu 11/12/2015, Normal    insulin lispro (HUMALOG KWIKPEN) 100 UNIT/ML KiwkPen Inject under skin up to 8 units 3x a day 15 min before a meal, Normal    Insulin Syringe-Needle U-100 (B-D INS SYR ULTRAFINE .5CC/30G) 30G X 1/2" 0.5 ML MISC Use as directed, Normal    levothyroxine (SYNTHROID, LEVOTHROID) 88 MCG tablet TAKE 1 TABLET BY MOUTH DAILY, Normal    lidocaine (LIDODERM) 5 % Place 1 patch onto the skin daily. Remove & Discard patch within 12 hours or as directed by MD , Historical Med    omeprazole (PRILOSEC) 20 MG capsule TAKE ONE CAPSULE BY MOUTH DAILY, Normal    potassium chloride (K-DUR,KLOR-CON) 10 MEQ tablet Take 1 tablet (10 mEq total) by mouth as needed. When you use lasix, Starting Thu 11/12/2015, Normal    rivastigmine (EXELON) 4.6 mg/24hr APPLY ONE PATCH TO A HAIRLESS AREA ON THE SKIN ONCE DAILY AS DIRECTED. REMOVE OLD PATCH BEFORE APPLYING NEW ONE., Normal    Vitamin D, Ergocalciferol, (DRISDOL) 50000 UNITS CAPS capsule Take 50,000 Units by mouth every 7 (seven) days., Historical Med         DISCHARGE INSTRUCTIONS:    Follow with PMD, if needed, hospice care.  If you experience worsening of your admission symptoms, develop shortness of breath, life threatening emergency, suicidal or homicidal  thoughts you must seek medical attention immediately by calling 911 or calling your MD immediately  if symptoms less severe.  You Must read complete instructions/literature along with all the possible adverse reactions/side effects for all the Medicines you take and that have been prescribed to you. Take any new Medicines after you have completely understood and accept all the possible adverse reactions/side effects.   Please note  You were cared for by a hospitalist during your hospital stay. If you have any questions about your discharge medications or the care you received while you were in the hospital after you are discharged, you can call the unit and asked to speak with the hospitalist on call if the hospitalist that took care of you is not available. Once you are discharged, your primary care physician will handle any further medical issues. Please note that NO REFILLS for any discharge medications will be authorized once you are discharged, as it is imperative that you return to your primary care physician (or establish a relationship with a  primary care physician if you do not have one) for your aftercare needs so that they can reassess your need for medications and monitor your lab values.    Today   CHIEF COMPLAINT:   Chief Complaint  Patient presents with  . Dizziness    HISTORY OF PRESENT ILLNESS:  Sara Lang  is a 82 y.o. female with a known history of Alzheimers dementia presents to the emergency department complaining of dehydration.  Patient was admitted for seizure-like activity with syncope and discharged home for outpatient follow-up 2 weeks ago. Patient's sons are her primary caregivers and note that since her discharge from the hospital she has had decreased by mouth intake, increased fatigue, sleeping up to 20 hours per day. They note constipation, poor skin turgor and decreased energy. This is her third hospital visit in the past few weeks. Patient's family is  requesting palliative care consultation considering her progressive decline in status  Patient is reportedly semi-independent with her activities of daily living. Otherwise there has been no change in status. Patient has been taking medication as prescribed and there has been no recent change in medication or diet.  There has been no recent illness, travel or sick contacts.    Patient offers no complaints.Marland Kitchen    VITAL SIGNS:  Blood pressure 135/63, pulse (!) 58, temperature 98.6 F (37 C), temperature source Oral, resp. rate 20, height 4\' 7"  (1.397 m), weight 52.6 kg (115 lb 14.4 oz), SpO2 98 %.  I/O:  No intake or output data in the 24 hours ending 11/29/15 1534  PHYSICAL EXAMINATION:   GENERAL: 82 y.o.-year-old white femalepatient, well-developed, well-nourished lying in the bed in no acute distress. Pleasantly confused HEENT: Head atraumatic, normocephalic. Pupils equal, round, reactive to light and accommodation. No scleral icterus. Extraocular muscles intact. Oropharynx is clear. Mucus membranes dry. NECK: Supple, no bruits heard CHEST: Normal breath sounds bilaterally. No wheezing, rales, rhonchi or crackles. No use of accessory muscles of respiration. No reproducible chest wall tenderness.  CARDIOVASCULAR: S1, S2 normal. No murmurs, rubs, or gallops appreciated. Cap refill <2 seconds. ABDOMEN: Soft, mildly distended, positive left lower quadrant tenderness to palpationNo rebound, guarding, rigidity. Normoactive bowel sounds present in all four quadrants. No organomegaly or mass. EXTREMITIES: Full range of motion. No pedal edema, cyanosis, or clubbing. NEUROLOGIC: Cranial nerves II through XII are grossly intact with no focal sensorimotor deficit. Muscle strength 4/5 in all extremities. Sensation intact. Gait not checked. PSYCHIATRIC: The patient is alert and oriented x 1. Normal affect, not talking much, but smiling. SKIN: Warm, dry, and intact without obvious rash, lesion, or  ulcer.   DATA REVIEW:   CBC  Recent Labs Lab 11/25/15 0349  WBC 7.3  HGB 12.7  HCT 36.9  PLT 243    Chemistries   Recent Labs Lab 11/24/15 1749  11/26/15 0421  NA 135  < > 141  K 3.6  < > 4.0  CL 97*  < > 110  CO2 29  < > 26  GLUCOSE 185*  < > 162*  BUN 31*  < > 26*  CREATININE 1.49*  < > 1.34*  CALCIUM 9.2  < > 8.3*  MG  --   < > 2.2  AST 19  --   --   ALT 10*  --   --   ALKPHOS 91  --   --   BILITOT 0.4  --   --   < > = values in this interval not displayed.  Cardiac Enzymes  Recent Labs Lab 11/24/15 1749  TROPONINI <0.03    Microbiology Results  Results for orders placed or performed during the hospital encounter of 11/11/15  Urine culture     Status: None   Collection Time: 11/11/15  4:17 PM  Result Value Ref Range Status   Specimen Description URINE, RANDOM  Final   Special Requests NONE  Final   Culture NO GROWTH Performed at [facility]   Final   Report Status 11/13/2015 FINAL  Final    RADIOLOGY:  No results found.  EKG:   Orders placed or performed during the hospital encounter of 11/24/15  . EKG 12-Lead  . EKG 12-Lead  . ED EKG  . ED EKG  . EKG      Management plans discussed with the patient, family and they are in agreement.  CODE STATUS:  Code Status History    Date Active Date Inactive Code Status Order ID Comments User Context   11/25/2015 12:34 AM 11/26/2015  4:27 PM DNR [id number]  Tonye Royalty, DO ED   11/25/2015 12:26 AM 11/25/2015 12:34 AM Full Code [id number]  Tonye Royalty, DO ED   11/11/2015 10:29 PM 11/12/2015  8:21 PM DNR [id number]  Oralia Manis, MD Inpatient   11/11/2015  7:37 PM 11/11/2015  9:00 PM Full Code [id number]  Enid Baas, MD Inpatient    Questions for Most Recent Historical Code Status (Order [id number])    Question Answer Comment   In the event of cardiac or respiratory ARREST Do not call a "code blue"    In the event of cardiac or respiratory ARREST Do not perform Intubation, CPR,  defibrillation or ACLS    In the event of cardiac or respiratory ARREST Use medication by any route, position, wound care, and other measures to relive pain and suffering. May use oxygen, suction and manual treatment of airway obstruction as needed for comfort.         Advance Directive Documentation   Flowsheet Row Most Recent Value  Type of Advance Directive  Living will  Pre-existing out of facility DNR order (yellow form or pink MOST form)  No data  "MOST" Form in Place?  No data      TOTAL TIME TAKING CARE OF THIS PATIENT: 35 minutes.    Altamese Dilling M.D on 11/29/2015 at 3:34 PM  Between 7am to 6pm - Pager - [phone]  After 6pm go to [email or web address] - Social research officer, government  [facility]  Office  [phone]  CC: Primary care physician; No PCP Per Patient   Note: This dictation was prepared with Dragon dictation along with smaller phrase technology. Any transcriptional errors that result from this process are unintentional.

## 2015-12-11 ENCOUNTER — Telehealth: Payer: Self-pay | Admitting: Family Medicine

## 2015-12-11 NOTE — Telephone Encounter (Signed)
Patient's son,Dominic,called to let Dr.Tower and Shapale know patient passed away on Oct 07, 2015.  He said she died peacefully and he and his brother were with her when she passed away. Dominic said he can be reached at [phone].

## 2015-12-11 NOTE — Telephone Encounter (Signed)
I spoke to him- he and his family are doing well.   She will be greatly missed and her family cared very well for her.

## 2015-12-23 DEATH — deceased

## 2016-11-29 IMAGING — CT CT HEAD W/O CM
2 of 3 series · 15 of 30 positions shown, 18 images · non-contrast
Comparison: 05/08/2012

CLINICAL DATA: Fall yesterday hitting head and face on table a
initial encounter

EXAM:
CT HEAD WITHOUT CONTRAST
CT MAXILLOFACIAL WITHOUT CONTRAST
CT CERVICAL SPINE WITHOUT CONTRAST
TECHNIQUE: Multidetector CT imaging of the head, cervical spine, and
maxillofacial structures were performed using the standard protocol
without intravenous contrast. Multiplanar CT image reconstructions
of the cervical spine and maxillofacial structures were also
generated.

[Series 2: head wo · axial · 0.41mm/px · z∈[+288,+369]mm · 4 of 32 slices shown]
[im 7/32  brain]
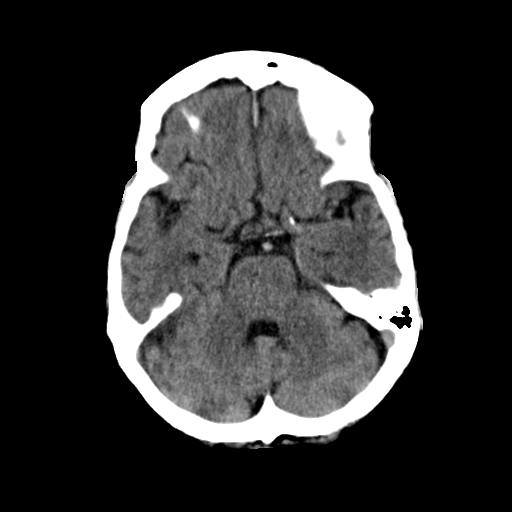
[im 13/32  brain]
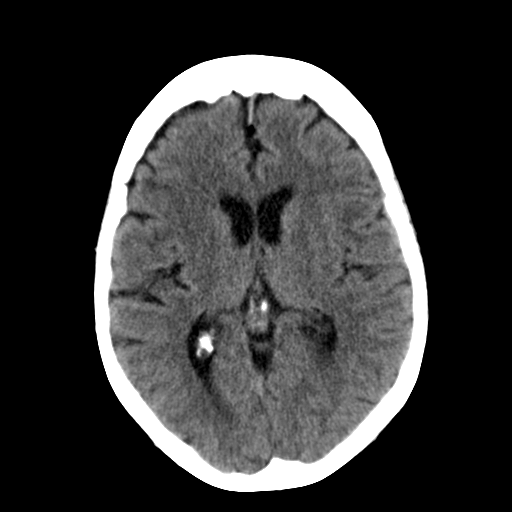
[im 19/32  brain]
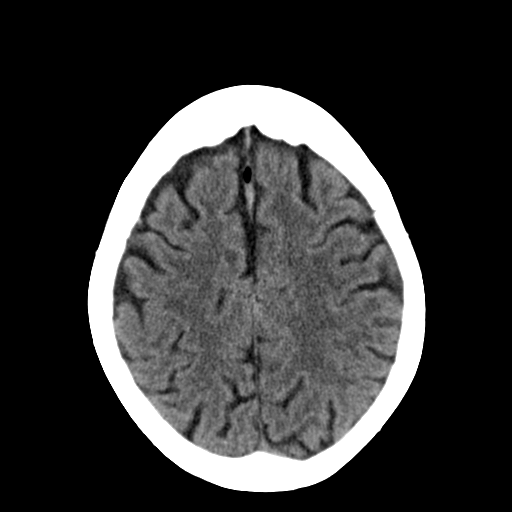
[im 25/32  brain]
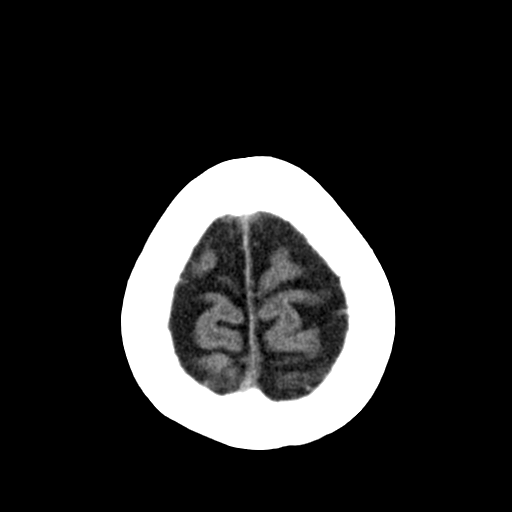

[Series 3: max soft · axial · 0.30mm/px · z∈[+160,+278]mm · 11 of 71 slices shown, 14 images]
[im 6/71  brain]
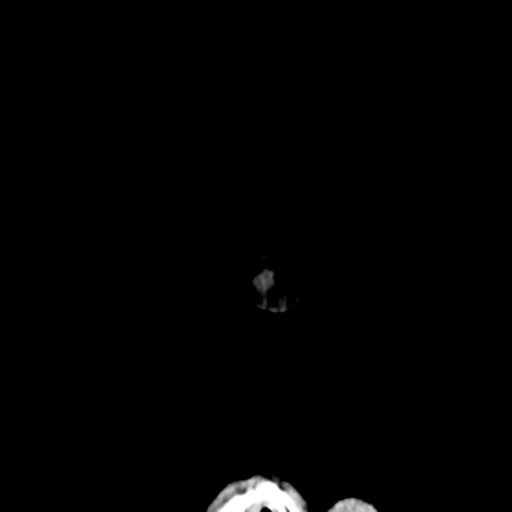
[im 6/71  bone]
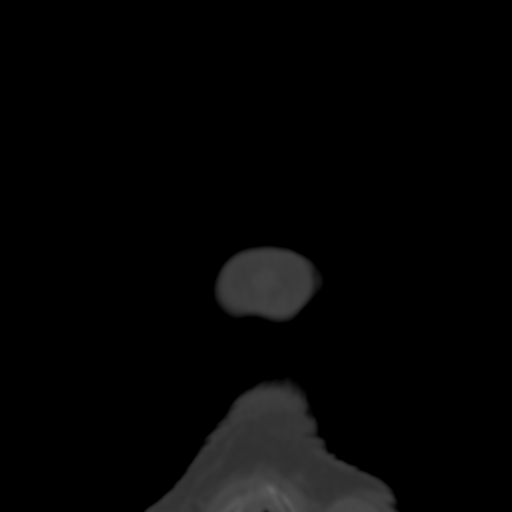
[im 12/71  brain]
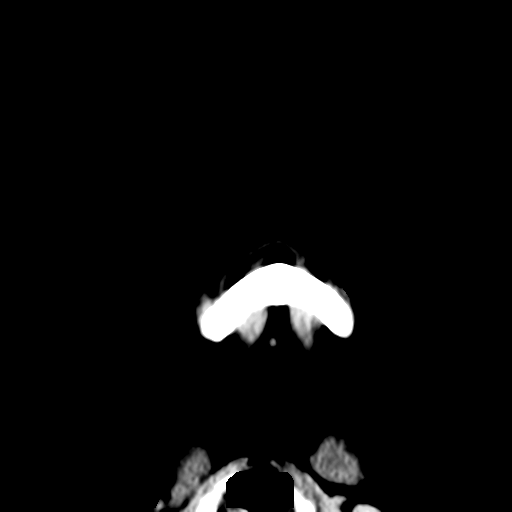
[im 18/71  brain]
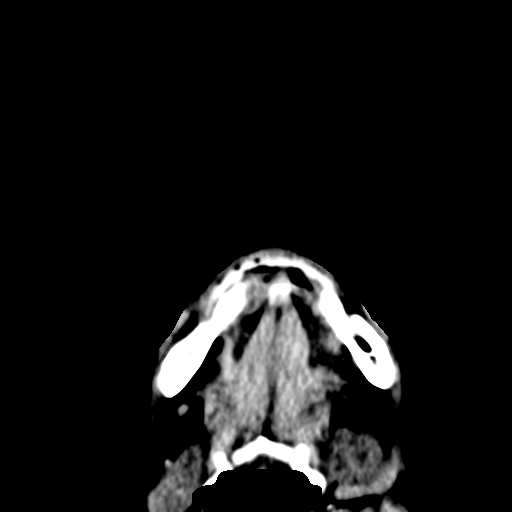
[im 24/71  brain]
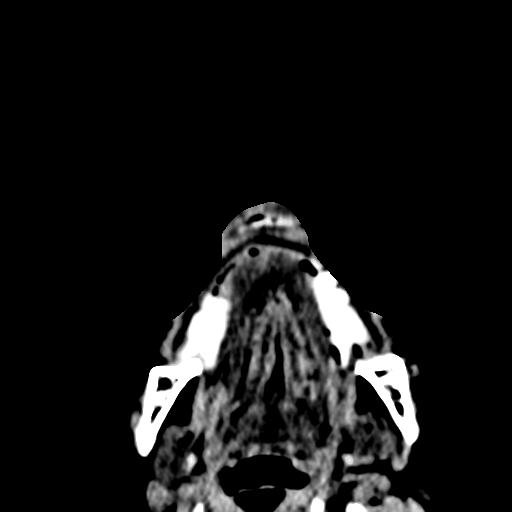
[im 30/71  brain]
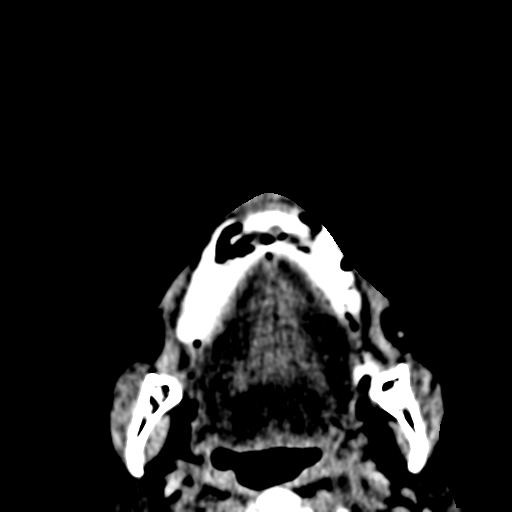
[im 30/71  bone]
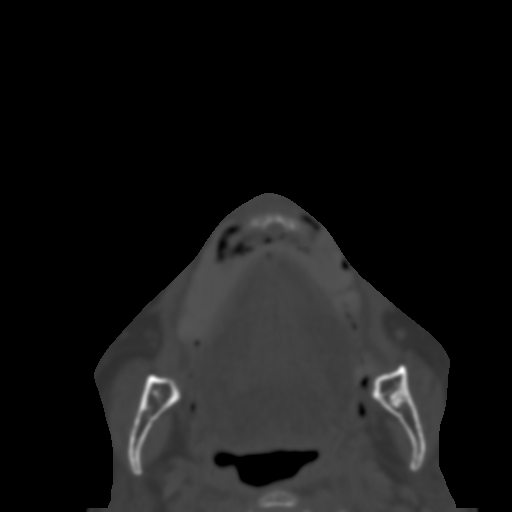
[im 36/71  brain]
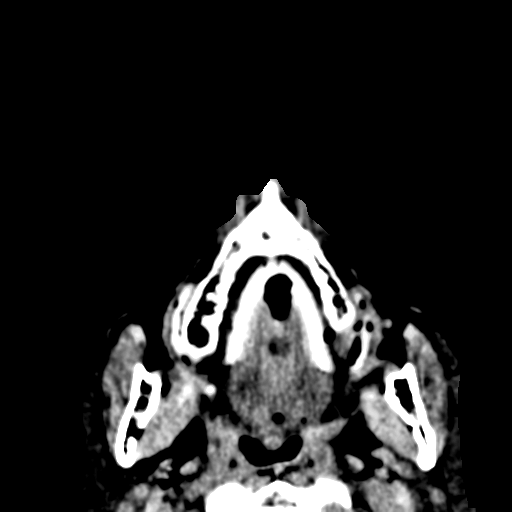
[im 41/71  brain]
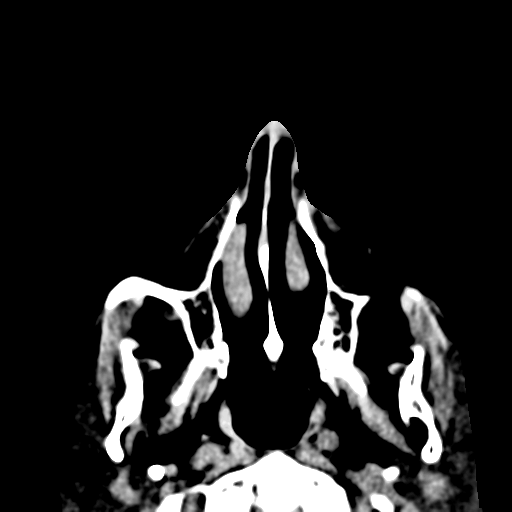
[im 47/71  brain]
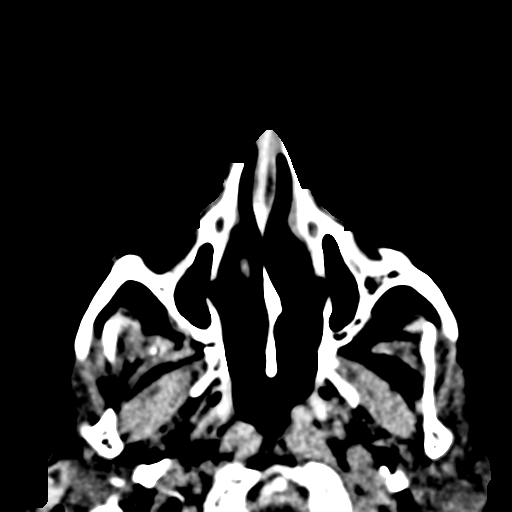
[im 53/71  brain]
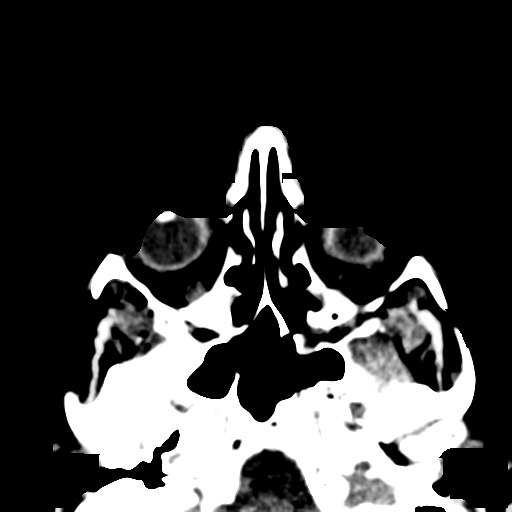
[im 53/71  bone]
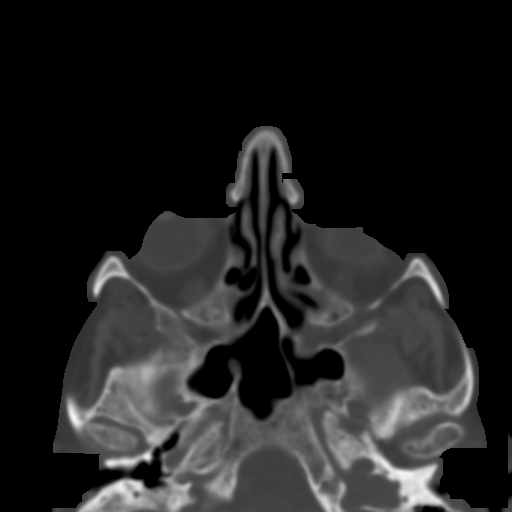
[im 59/71  brain]
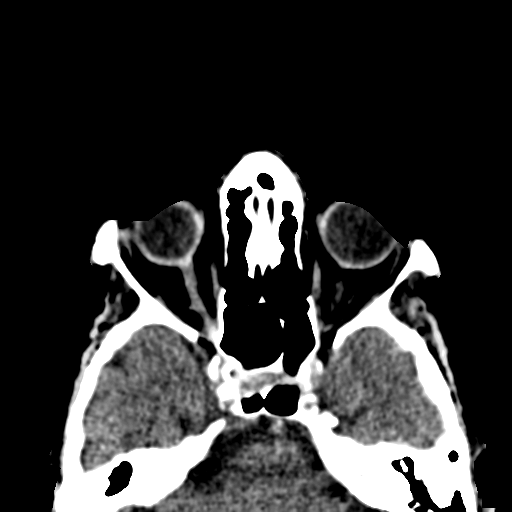
[im 65/71  brain]
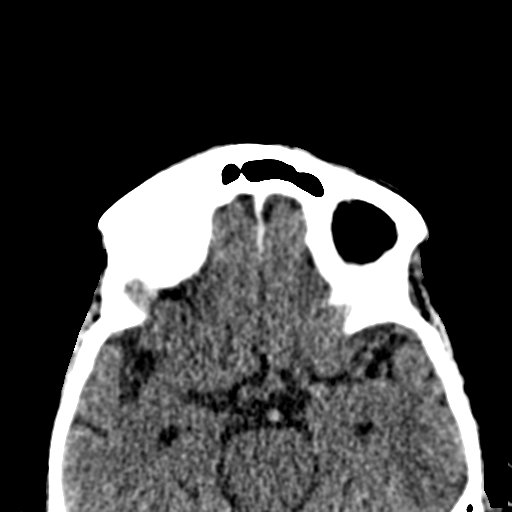

[15 of 30 positions shown; findings below may reference images not displayed]

FINDINGS: CT HEAD FINDINGS

The bony calvarium is intact. There is incomplete aeration of the
mastoid air cells on the right stable in appearance from the prior
study. Atrophic changes and chronic white matter ischemic change are
seen. No findings to suggest acute hemorrhage, acute infarction or
space-occupying mass lesion are noted.

CT MAXILLOFACIAL FINDINGS

No acute bony abnormality is seen. The paranasal sinuses are
somewhat hypoplastic without air-fluid level or significant mucosal
abnormality. The orbits and their contents are within normal limits.
No gross soft tissue abnormality is seen.

CT CERVICAL SPINE FINDINGS

Seven cervical segments are well visualized. Vertebral body height
is well maintained. Disc space narrowing is noted at C5-6 and C6-7
with associated osteophytic change. Multilevel facet hypertrophic
changes are noted. No acute fracture or acute facet abnormality is
seen. The surrounding soft tissue structures show no acute
abnormality.
IMPRESSION: CT of the head: Chronic atrophic and ischemic changes without acute
abnormality.

CT of the maxillofacial bones: No acute bony abnormality is seen.

CT of the cervical spine: Degenerative change without acute
abnormality.
